# Patient Record
Sex: Female | Born: 1960 | Race: White | Hispanic: No | Marital: Single | Smoking: Never smoker
Health system: Southern US, Community
[De-identification: ages and names within clinical notes are randomized; demographics above are authoritative.]

## PROBLEM LIST (undated history)

## (undated) DIAGNOSIS — I639 Cerebral infarction, unspecified: Secondary | ICD-10-CM

## (undated) DIAGNOSIS — I10 Essential (primary) hypertension: Secondary | ICD-10-CM

---

## 2012-05-22 DIAGNOSIS — I639 Cerebral infarction, unspecified: Secondary | ICD-10-CM

## 2012-05-22 HISTORY — DX: Cerebral infarction, unspecified: I63.9

## 2016-06-25 ENCOUNTER — Emergency Department (HOSPITAL_COMMUNITY)
Admission: EM | Admit: 2016-06-25 | Discharge: 2016-06-25 | Disposition: A | Discharge status: Home / Self Care | Payer: Medicaid Other | Attending: Emergency Medicine | Admitting: Emergency Medicine

## 2016-06-25 ENCOUNTER — Encounter (HOSPITAL_COMMUNITY): Payer: Self-pay

## 2016-06-25 DIAGNOSIS — R634 Abnormal weight loss: Secondary | ICD-10-CM | POA: Diagnosis present

## 2016-06-25 DIAGNOSIS — Z79899 Other long term (current) drug therapy: Secondary | ICD-10-CM | POA: Insufficient documentation

## 2016-06-25 DIAGNOSIS — B351 Tinea unguium: Secondary | ICD-10-CM

## 2016-06-25 DIAGNOSIS — B353 Tinea pedis: Secondary | ICD-10-CM

## 2016-06-25 HISTORY — DX: Essential (primary) hypertension: I10

## 2016-06-25 HISTORY — DX: Cerebral infarction, unspecified: I63.9

## 2016-06-25 LAB — BASIC METABOLIC PANEL
Anion gap: 12 (ref 5–15)
BUN: 11 mg/dL (ref 6–20)
CALCIUM: 9.9 mg/dL (ref 8.9–10.3)
CO2: 24 mmol/L (ref 22–32)
CREATININE: 0.98 mg/dL (ref 0.44–1.00)
Chloride: 105 mmol/L (ref 101–111)
GFR calc Af Amer: >60 mL/min (ref >60)
GLUCOSE: 125 mg/dL — AB (ref 65–99)
POTASSIUM: 3.6 mmol/L (ref 3.5–5.1)
Sodium: 141 mmol/L (ref 135–145)

## 2016-06-25 LAB — CBC
HEMATOCRIT: 39.4 % (ref 36.0–46.0)
Hemoglobin: 12.2 g/dL (ref 12.0–15.0)
MCH: 21.9 pg — ABNORMAL LOW (ref 26.0–34.0)
MCHC: 31 g/dL (ref 30.0–36.0)
MCV: 70.7 fL — ABNORMAL LOW (ref 78.0–100.0)
Platelets: 157 10*3/uL (ref 150–400)
RBC: 5.57 MIL/uL — ABNORMAL HIGH (ref 3.87–5.11)
RDW: 17.5 % — AB (ref 11.5–15.5)
WBC: 5.5 10*3/uL (ref 4.0–10.5)

## 2016-06-25 MED ORDER — FLUCONAZOLE 100 MG PO TABS
150.0000 mg | ORAL_TABLET | Freq: Once | ORAL | Status: AC
  Administered 2016-06-25: 150 mg via ORAL
  Filled 2016-06-25: qty 2

## 2016-06-25 MED ORDER — FLUCONAZOLE 150 MG PO TABS: 150.0000 mg | ORAL_TABLET | ORAL | 0 refills | Status: AC

## 2016-06-25 MED ORDER — KETOCONAZOLE 2 % EX CREA: 1.0000 "application " | TOPICAL_CREAM | Freq: Two times a day (BID) | CUTANEOUS | 0 refills | Status: AC

## 2016-06-25 NOTE — ED Triage Notes (Signed)
Patient here with daughter who is concerned that patients feet appear discolored and has noticed some blisters to feet. On exam denies pain, wrinkled loose skin with ruptured possible blisters to toes, daughter states have been there for some time also increased weight loss. NAD

## 2016-06-25 NOTE — ED Provider Notes (Signed)
[facility] DEPT Provider Note   CSN: [id number] Arrival date & time: 06/25/16  1837     History   Chief Complaint Chief Complaint  Patient presents with  . blisters feet/ weight loss    HPI Erica Lopez is a 56 y.o. female.  HPI   56 yo F with h/o HTN, CVA who p/w "blisters" on her feet. Pt presents with her daughter. Daughter reports that pt was recently brought to live with her as she was not being cared for at her home in [place]. Pt arrived with "black blisters" between her toes b/l. Her daughter washed her thoroughly but some of the blisters have persisted. Pt has h/o similar blisters from fleas in the past. No rash or redness. No swelling. Pt is currently without complaints denies any leg pain, swelling, redness. No new detergents, clothes, or socks. No known insect exposures. No history of autoimmune conditions.  Past Medical History:  Diagnosis Date  . Hypertension   . Stroke [facility])     There are no active problems to display for this patient.   History reviewed. No pertinent surgical history.  OB History    No data available       Home Medications    Prior to Admission medications   Medication Sig Start Date End Date Taking? Authorizing Provider  fluconazole (DIFLUCAN) 150 MG tablet Take 1 tablet (150 mg total) by mouth once a week. 06/25/16 07/23/16  Shaune Pollack, MD  ketoconazole (NIZORAL) 2 % cream Apply 1 application topically 2 (two) times daily. 06/25/16 07/09/16  Shaune Pollack, MD    Family History No family history on file.  Social History Social History  Substance Use Topics  . Smoking status: Never Smoker  . Smokeless tobacco: Never Used  . Alcohol use Not on file     Allergies   Aspirin   Review of Systems Review of Systems  Constitutional: Negative for chills, fatigue and fever.  HENT: Negative for congestion, rhinorrhea and sore throat.   Eyes: Negative for visual disturbance.  Respiratory: Negative for cough, shortness of breath and  wheezing.   Cardiovascular: Negative for chest pain and leg swelling.  Gastrointestinal: Negative for abdominal pain, diarrhea, nausea and vomiting.  Genitourinary: Negative for dysuria, flank pain, vaginal bleeding and vaginal discharge.  Musculoskeletal: Negative for neck pain.  Skin: Positive for rash.  Allergic/Immunologic: Negative for immunocompromised state.  Neurological: Negative for syncope and headaches.  Hematological: Does not bruise/bleed easily.  All other systems reviewed and are negative.    Physical Exam Updated Vital Signs BP 141/93   Pulse 80   Temp 98.7 F (37.1 C) (Oral)   Resp 18   SpO2 100%   Physical Exam  Constitutional: She is oriented to person, place, and time. She appears well-developed and well-nourished. No distress.  HENT:  Head: Normocephalic and atraumatic.  Eyes: Conjunctivae are normal.  Neck: Neck supple.  Cardiovascular: Normal rate, regular rhythm and normal heart sounds.  Exam reveals no friction rub.   No murmur heard. Pulmonary/Chest: Effort normal and breath sounds normal. No respiratory distress. She has no wheezes. She has no rales.  Abdominal: She exhibits no distension.  Musculoskeletal: She exhibits no edema.  Neurological: She is alert and oriented to person, place, and time. She exhibits normal muscle tone.  Skin: Skin is warm. Capillary refill takes less than 2 seconds.  Superficial excoriation in interdigital spaces bilaterally with secondary skin thickening and flaking. Marked thickening and yellowing of toenails b/l. No erythema, warmth, fluctuance,  or drainage, No TTP.  Psychiatric: She has a normal mood and affect.  Nursing note and vitals reviewed.    ED Treatments / Results  Labs (all labs ordered are listed, but only abnormal results are displayed) Labs Reviewed  BASIC METABOLIC PANEL - Abnormal; Notable for the following:       Result Value   Glucose, Bld 125 (*)    All other components within normal limits    CBC - Abnormal; Notable for the following:    RBC 5.57 (*)    MCV 70.7 (*)    MCH 21.9 (*)    RDW 17.5 (*)    All other components within normal limits    EKG  EKG Interpretation None       Radiology No results found.  Procedures Procedures (including critical care time)  Medications Ordered in ED Medications  fluconazole (DIFLUCAN) tablet 150 mg (not administered)     Initial Impression / Assessment and Plan / ED Course  I have reviewed the triage vital signs and the nursing notes.  Pertinent labs & imaging results that were available during my care of the patient were reviewed by me and considered in my medical decision making (see chart for details).    56 yo F with h/o HTN, CVA who p/w mild scaling/skin darkening of bilateral feet, c/w tinea pedis and fungal infection. No erythema, warmth, TTP or signs of cellulitis or abscess. No leg swelling, do not suspect DVT. Pt is o/w systemically very well appearing, in NAD, and without other complaints. Mental status is at baseline. CBC, BMP reassuring. Will place on topical as well as oral antifungals for severe onychomycosis, and refer to Podiatry as well as PCP.  Final Clinical Impressions(s) / ED Diagnoses   Final diagnoses:  Tinea pedis of both feet  Onychomycosis    New Prescriptions New Prescriptions   FLUCONAZOLE (DIFLUCAN) 150 MG TABLET    Take 1 tablet (150 mg total) by mouth once a week.   KETOCONAZOLE (NIZORAL) 2 % CREAM    Apply 1 application topically 2 (two) times daily.     Shaune Pollackameron Harlyn Rathmann, MD 06/25/16 2053

## 2016-06-25 NOTE — ED Notes (Signed)
EDP at bedside  

## 2016-07-13 ENCOUNTER — Ambulatory Visit: Payer: Medicaid Other | Attending: Family Medicine | Admitting: Family Medicine

## 2016-07-13 ENCOUNTER — Encounter: Payer: Self-pay | Admitting: Family Medicine

## 2016-07-13 DIAGNOSIS — R636 Underweight: Secondary | ICD-10-CM | POA: Insufficient documentation

## 2016-07-13 DIAGNOSIS — B353 Tinea pedis: Secondary | ICD-10-CM | POA: Diagnosis not present

## 2016-07-13 DIAGNOSIS — R630 Anorexia: Secondary | ICD-10-CM | POA: Diagnosis present

## 2016-07-13 DIAGNOSIS — Z8673 Personal history of transient ischemic attack (TIA), and cerebral infarction without residual deficits: Secondary | ICD-10-CM | POA: Diagnosis not present

## 2016-07-13 DIAGNOSIS — I10 Essential (primary) hypertension: Secondary | ICD-10-CM | POA: Insufficient documentation

## 2016-07-13 DIAGNOSIS — I69319 Unspecified symptoms and signs involving cognitive functions following cerebral infarction: Secondary | ICD-10-CM | POA: Diagnosis not present

## 2016-07-13 MED ORDER — AMLODIPINE BESYLATE 10 MG PO TABS: 10.0000 mg | ORAL_TABLET | Freq: Every day | ORAL | 3 refills | Status: DC

## 2016-07-13 MED ORDER — LISINOPRIL 40 MG PO TABS: 40.0000 mg | ORAL_TABLET | Freq: Every day | ORAL | 3 refills | Status: DC

## 2016-07-13 NOTE — Progress Notes (Signed)
Subjective:  Patient ID: Erica BurrowMiriam Chabot, female    DOB: [birth year]  Age: 56 y.o. MRN: [id number]  CC: Establish Care; foot issues; Anorexia; and stroke (2014)   HPI Erica Lopez presents To establish care. Medical history is significant for CVA in 2014 with resulting mild cognitive deficits and no hemiparesis, hypertension. She was recently seen at the ED for tinea pedis and was placed on Diflucan as well as Nizoral cream which she has been compliant with.  She is accompanied by her daughter who is concerned about the poor care she received while living with other family [place]; she has been anorexic and has lost weight (unable to quantify weight loss). She has [place] her [place] and plans to keep her here. We have no additional concerns at this time  Past Medical History:  Diagnosis Date  . Hypertension   . Stroke [facility](HCC)     History reviewed. No pertinent surgical history.  Allergies  Allergen Reactions  . Aspirin Anaphylaxis     Outpatient Medications Prior to Visit  Medication Sig Dispense Refill  . fluconazole (DIFLUCAN) 150 MG tablet Take 1 tablet (150 mg total) by mouth once a week. 4 tablet 0   No facility-administered medications prior to visit.     ROS Review of Systems  Constitutional: Positive for appetite change. Negative for activity change and fatigue.  HENT: Negative for congestion, sinus pressure and sore throat.   Eyes: Negative for visual disturbance.  Respiratory: Negative for cough, chest tightness, shortness of breath and wheezing.   Cardiovascular: Negative for chest pain and palpitations.  Gastrointestinal: Negative for abdominal distention, abdominal pain and constipation.  Endocrine: Negative for polydipsia.  Genitourinary: Negative for dysuria and frequency.  Musculoskeletal: Negative for arthralgias and back pain.  Skin:       See hpi  Neurological: Negative for tremors, light-headedness and numbness.    Hematological: Does not bruise/bleed easily.  Psychiatric/Behavioral: Negative for agitation and behavioral problems.    Objective:  BP 120/83 (BP Location: Right Arm, Patient Position: Sitting, Cuff Size: Small)   Pulse 97   Temp 98.2 F (36.8 C) (Oral)   Ht 5' 3.5" (1.613 m)   Wt 97 lb 3.2 oz (44.1 kg)   SpO2 98%   BMI 16.95 kg/m   BP/Weight 07/13/2016 06/25/2016  Systolic BP 120 141  Diastolic BP 83 93  Wt. (Lbs) 97.2 -  BMI 16.95 -      Physical Exam  Constitutional: She is oriented to person, place, and time.  Thin  Cardiovascular: Normal rate, normal heart sounds and intact distal pulses.   No murmur heard. Pulmonary/Chest: Effort normal and breath sounds normal. She has no wheezes. She has no rales. She exhibits no tenderness.  Abdominal: Soft. Bowel sounds are normal. She exhibits no distension and no mass. There is no tenderness.  Musculoskeletal: Normal range of motion.  Neurological: She is alert and oriented to person, place, and time.  Skin:  Tinea pedis in webspaces toes bilaterally     Assessment & Plan:   1. History of stroke Mild cognitive decline  2. Tinea pedis of both feet Tinea Diflucan and Nizoral cream.  3. Underweight Could be secondary to anorexia but will need to exclude thyroid disorder ? Lack of committed care giver - TSH  4. Essential hypertension Controlled Low-sodium diet - lisinopril (PRINIVIL,ZESTRIL) 40 MG tablet; Take 1 tablet (40 mg total) by mouth daily.  Dispense: 30 tablet; Refill: 3 - amLODipine (NORVASC) 10  MG tablet; Take 1 tablet (10 mg total) by mouth daily.  Dispense: 30 tablet; Refill: 3   Meds ordered this encounter  Medications  . lisinopril (PRINIVIL,ZESTRIL) 40 MG tablet    Sig: Take 1 tablet (40 mg total) by mouth daily.    Dispense:  30 tablet    Refill:  3  . amLODipine (NORVASC) 10 MG tablet    Sig: Take 1 tablet (10 mg total) by mouth daily.    Dispense:  30 tablet    Refill:  3    Follow-up:  Return in about 1 month (around 08/10/2016) for Follow-up on tinea pedis and weight.   Jaclyn Shaggy MD

## 2016-08-03 ENCOUNTER — Emergency Department (HOSPITAL_COMMUNITY)
Admission: EM | Admit: 2016-08-03 | Discharge: 2016-08-03 | Disposition: A | Discharge status: Home / Self Care | Payer: Medicaid Other | Attending: Physician Assistant | Admitting: Physician Assistant

## 2016-08-03 ENCOUNTER — Emergency Department (HOSPITAL_COMMUNITY): Payer: Medicaid Other

## 2016-08-03 ENCOUNTER — Encounter (HOSPITAL_COMMUNITY): Payer: Self-pay

## 2016-08-03 DIAGNOSIS — R55 Syncope and collapse: Secondary | ICD-10-CM

## 2016-08-03 DIAGNOSIS — E860 Dehydration: Secondary | ICD-10-CM | POA: Insufficient documentation

## 2016-08-03 DIAGNOSIS — Z79899 Other long term (current) drug therapy: Secondary | ICD-10-CM | POA: Diagnosis not present

## 2016-08-03 DIAGNOSIS — Z8673 Personal history of transient ischemic attack (TIA), and cerebral infarction without residual deficits: Secondary | ICD-10-CM | POA: Insufficient documentation

## 2016-08-03 DIAGNOSIS — I10 Essential (primary) hypertension: Secondary | ICD-10-CM | POA: Insufficient documentation

## 2016-08-03 LAB — URINALYSIS, ROUTINE W REFLEX MICROSCOPIC
BILIRUBIN URINE: NEGATIVE
Glucose, UA: 50 mg/dL — AB
Hgb urine dipstick: NEGATIVE
KETONES UR: 20 mg/dL — AB
Nitrite: NEGATIVE
Protein, ur: 100 mg/dL — AB
Specific Gravity, Urine: 1.019 (ref 1.005–1.030)
pH: 5 (ref 5.0–8.0)

## 2016-08-03 LAB — BASIC METABOLIC PANEL
ANION GAP: 11 (ref 5–15)
BUN: 18 mg/dL (ref 6–20)
CALCIUM: 9.4 mg/dL (ref 8.9–10.3)
CO2: 25 mmol/L (ref 22–32)
Chloride: 104 mmol/L (ref 101–111)
Creatinine, Ser: 1.37 mg/dL — ABNORMAL HIGH (ref 0.44–1.00)
GFR calc Af Amer: 49 mL/min — ABNORMAL LOW (ref >60)
GFR calc non Af Amer: 42 mL/min — ABNORMAL LOW (ref >60)
GLUCOSE: 123 mg/dL — AB (ref 65–99)
Potassium: 6.1 mmol/L — ABNORMAL HIGH (ref 3.5–5.1)
Sodium: 140 mmol/L (ref 135–145)

## 2016-08-03 LAB — CBC
HCT: 46.6 % — ABNORMAL HIGH (ref 36.0–46.0)
HEMOGLOBIN: 14.6 g/dL (ref 12.0–15.0)
MCH: 22 pg — AB (ref 26.0–34.0)
MCHC: 31.3 g/dL (ref 30.0–36.0)
MCV: 70.2 fL — ABNORMAL LOW (ref 78.0–100.0)
Platelets: 180 10*3/uL (ref 150–400)
RBC: 6.64 MIL/uL — ABNORMAL HIGH (ref 3.87–5.11)
RDW: 17.1 % — AB (ref 11.5–15.5)
WBC: 8.5 10*3/uL (ref 4.0–10.5)

## 2016-08-03 LAB — I-STAT TROPONIN, ED: TROPONIN I, POC: 0 ng/mL (ref 0.00–0.08)

## 2016-08-03 MED ORDER — SODIUM CHLORIDE 0.9 % IV BOLUS (SEPSIS)
1000.0000 mL | Freq: Once | INTRAVENOUS | Status: AC
  Administered 2016-08-03: 1000 mL via INTRAVENOUS

## 2016-08-03 NOTE — ED Notes (Signed)
Pt ambulatory around nursing station with this nurse and Kenney Housemananya, NT. Pt denies SOB, dizziness, or diffuculty walking. Pt with steady gait with one assist. Pt O2 sat 99-100%

## 2016-08-03 NOTE — ED Notes (Signed)
Pt refusing XR, states she wants to go home. Dr. Juliann ParesMackeun aware.

## 2016-08-03 NOTE — ED Notes (Signed)
ED Provider at bedside. 

## 2016-08-03 NOTE — ED Triage Notes (Signed)
Pt presents to the ed with ems for a syncopal episode, she was found on the floor by the daughter, she was responsive to voice unknown for how long she was down, she was sitting on the toilet on ems arrival, denies any injury or complaints at this time. Daughter states last time this happened she had a stroke. Reports slowed speech that has been increasingly worse for a year.

## 2016-08-03 NOTE — ED Notes (Signed)
Patient transported to CT 

## 2016-08-03 NOTE — ED Provider Notes (Addendum)
[facility] Provider Note   CSN: [id number] Arrival date & time: 08/03/16  1423     History   Chief Complaint Chief Complaint  Patient presents with  . Loss of Consciousness    HPI Erica Lopez is a 56 y.o. female.  HPI   Patient is a 89 her old female presenting with syncope. This happened while patient went to the bathroom. Patient remembers going to the bathroom and then she woke up on the floor. Her daughter found her on the floor. Patient has history of stroke 2 years ago. History of hypertension, no diabetes no hyperlipidemia. Patient has no history of CHF.   Patient denies shortness of breath, chest pain.  Has global weakness.   Past Medical History:  Diagnosis Date  . Hypertension   . Stroke [facility](HCC)     Patient Active Problem List   Diagnosis Date Noted  . History of stroke 07/13/2016  . Tinea pedis 07/13/2016  . Underweight 07/13/2016  . Hypertension 07/13/2016    History reviewed. No pertinent surgical history.  OB History    No data available       Home Medications    Prior to Admission medications   Medication Sig Start Date End Date Taking? Authorizing Provider  amLODipine (NORVASC) 10 MG tablet Take 1 tablet (10 mg total) by mouth daily. 07/13/16   Jaclyn ShaggyEnobong Amao, MD  ketoconazole (NIZORAL) 2 % cream Apply 1 application topically daily.    Historical Provider, MD  lisinopril (PRINIVIL,ZESTRIL) 40 MG tablet Take 1 tablet (40 mg total) by mouth daily. 07/13/16   Jaclyn ShaggyEnobong Amao, MD    Family History History reviewed. No pertinent family history.  Social History Social History  Substance Use Topics  . Smoking status: Never Smoker  . Smokeless tobacco: Never Used  . Alcohol use No     Allergies   Aspirin   Review of Systems Review of Systems  Constitutional: Positive for fatigue. Negative for activity change and fever.  Respiratory: Negative for shortness of breath.   Cardiovascular: Negative for chest pain.  Gastrointestinal:  Negative for abdominal pain.  Neurological: Positive for syncope. Negative for dizziness, seizures, weakness and light-headedness.  All other systems reviewed and are negative.    Physical Exam Updated Vital Signs BP (!) 130/98   Pulse (!) 133   Temp 97.9 F (36.6 C) (Oral)   Resp 13   SpO2 97%   Physical Exam  Constitutional: She is oriented to person, place, and time. She appears well-developed and well-nourished.  HENT:  Head: Normocephalic and atraumatic.  Dry mucus membranes  Eyes: Right eye exhibits no discharge. Left eye exhibits no discharge.  Cardiovascular: Regular rhythm.   No murmur heard. tachycardia  Pulmonary/Chest: Effort normal and breath sounds normal. No respiratory distress. She has no wheezes.  Musculoskeletal: Normal range of motion.  Neurological: She is oriented to person, place, and time.  Equal strength bilaterally upper and lower extremities negative pronator drift. Normal sensation bilaterally. Speech comprehensible, no slurring. Facial nerve tested and mild droop on left. l. Alert and oriented 3. Slowed responses.   Skin: Skin is warm and dry. She is not diaphoretic.  Psychiatric: She has a normal mood and affect.  Nursing note and vitals reviewed.    ED Treatments / Results  Labs (all labs ordered are listed, but only abnormal results are displayed) Labs Reviewed  BASIC METABOLIC PANEL - Abnormal; Notable for the following:       Result Value   Potassium 6.1 (*)  Glucose, Bld 123 (*)    Creatinine, Ser 1.37 (*)    GFR calc non Af Amer 42 (*)    GFR calc Af Amer 49 (*)    All other components within normal limits  CBC - Abnormal; Notable for the following:    RBC 6.64 (*)    HCT 46.6 (*)    MCV 70.2 (*)    MCH 22.0 (*)    RDW 17.1 (*)    All other components within normal limits  URINALYSIS, ROUTINE W REFLEX MICROSCOPIC - Abnormal; Notable for the following:    APPearance HAZY (*)    Glucose, UA 50 (*)    Ketones, ur 20 (*)     Protein, ur 100 (*)    Leukocytes, UA MODERATE (*)    Bacteria, UA FEW (*)    Squamous Epithelial / LPF 0-5 (*)    All other components within normal limits  I-STAT TROPOININ, ED  CBG MONITORING, ED    EKG  EKG Interpretation  Date/Time:  Thursday August 03 2016 14:43:52 EDT Ventricular Rate:  78 PR Interval:    QRS Duration: 85 QT Interval:  367 QTC Calculation: 418 R Axis:   81 Text Interpretation:  Sinus rhythm Low voltage with right axis deviation Probable anteroseptal infarct, old no sig ischemia noted Confirmed by Kandis Mannan ([id number]) on 08/03/2016 3:01:05 PM Also confirmed by Kandis Mannan ([id number]), editor Misty Stanley [phone])  on 08/03/2016 3:01:55 PM Also confirmed by Kandis Mannan ([id number])  on 08/03/2016 3:15:57 PM       Radiology Ct Head Wo Contrast  Result Date: 08/03/2016 CLINICAL DATA:  Syncopal episode EXAM: CT HEAD WITHOUT CONTRAST TECHNIQUE: Contiguous axial images were obtained from the base of the skull through the vertex without intravenous contrast. COMPARISON:  None. FINDINGS: Brain: Scattered areas of decreased attenuation are noted consistent with chronic white matter ischemic change. No findings to suggest acute hemorrhage, acute infarction or space-occupying mass lesion are noted. Vascular: No hyperdense vessel or unexpected calcification. Skull: Normal. Negative for fracture or focal lesion. Sinuses/Orbits: No acute finding. Other: None. IMPRESSION: Chronic white matter ischemic change without acute abnormality. Electronically Signed   By: Alcide Clever M.D.   On: 08/03/2016 16:55    Procedures Procedures (including critical care time)  Medications Ordered in ED Medications  sodium chloride 0.9 % bolus 1,000 mL (not administered)     Initial Impression / Assessment and Plan / ED Course  I have reviewed the triage vital signs and the nursing notes.  Pertinent labs & imaging results that were available during my care of the patient  were reviewed by me and considered in my medical decision making (see chart for details).    This 75 her old female with history of stroke 2 years ago from hypertension. Patient has presented today with syncope. Patient has left facial weakness (residual).. We will get labs to make sure patient does not have anemia, or other electrolyte abnormality causing syncope.  EKG  Is non specific.  Pt appears globally weak thought walked without assistance very well.  She just has some cognitive slowling.   Patient's daughter is concerned because she is unable to eat at home. (refuses to eat) We receommend her bring this up with PCP for nutritionist appt.   Discussed admission with patient and her daughter. Patient like to go home. She refuses admission at this time. Recommend patient that she should get some fluids before going home. She does have an appointment with a primary care  physician next week. She reports that she will go and get her labs checked at that visit.Marland Kitchen Discussed risks and benefits.   Final Clinical Impressions(s) / ED Diagnoses   Final diagnoses:  Syncope and collapse    New Prescriptions New Prescriptions   No medications on file     Courteney Randall An, MD 08/03/16 1708    Courteney Lyn Mackuen, MD 08/03/16 1726    Courteney Randall An, MD 08/03/16 1730    Courteney Lyn Mackuen, MD 08/03/16 1730

## 2016-08-03 NOTE — ED Notes (Signed)
Blood return noted and peripheral IV flushes appropriately however catheter is not fully advanced. Attempted a second line on pt but was unsuccessful. Pt reports she would rather administer the bolus in the 20 G she already has and keep a close eye "than be stuck again". Will continue to assess pt peripheral line for complications.

## 2016-08-03 NOTE — Discharge Instructions (Signed)
You're very dehydrated on exam. Your kidney showed that you're not drinking enough fluids. Please continue drinking fluids and have your kidney rechecked. Please return if you have any chest pain, or any other concerns.

## 2016-08-10 ENCOUNTER — Ambulatory Visit: Payer: Medicaid Other | Attending: Family Medicine | Admitting: Family Medicine

## 2016-08-10 ENCOUNTER — Encounter: Payer: Self-pay | Admitting: Family Medicine

## 2016-08-10 VITALS — BP 143/82 | HR 74 | Temp 97.8°F | Ht 63.0 in | Wt 97.4 lb

## 2016-08-10 DIAGNOSIS — Q819 Epidermolysis bullosa, unspecified: Secondary | ICD-10-CM | POA: Diagnosis not present

## 2016-08-10 DIAGNOSIS — B353 Tinea pedis: Secondary | ICD-10-CM | POA: Insufficient documentation

## 2016-08-10 DIAGNOSIS — E43 Unspecified severe protein-calorie malnutrition: Secondary | ICD-10-CM | POA: Insufficient documentation

## 2016-08-10 DIAGNOSIS — E860 Dehydration: Secondary | ICD-10-CM | POA: Diagnosis present

## 2016-08-10 DIAGNOSIS — I10 Essential (primary) hypertension: Secondary | ICD-10-CM

## 2016-08-10 DIAGNOSIS — E46 Unspecified protein-calorie malnutrition: Secondary | ICD-10-CM | POA: Insufficient documentation

## 2016-08-10 DIAGNOSIS — Z8673 Personal history of transient ischemic attack (TIA), and cerebral infarction without residual deficits: Secondary | ICD-10-CM | POA: Insufficient documentation

## 2016-08-10 MED ORDER — LISINOPRIL 40 MG PO TABS: 40.0000 mg | ORAL_TABLET | Freq: Every day | ORAL | 1 refills | Status: DC

## 2016-08-10 MED ORDER — MIRTAZAPINE 15 MG PO TABS: 15.0000 mg | ORAL_TABLET | Freq: Every day | ORAL | 1 refills | Status: DC

## 2016-08-10 MED ORDER — AMLODIPINE BESYLATE 10 MG PO TABS: 10.0000 mg | ORAL_TABLET | Freq: Every day | ORAL | 1 refills | Status: DC

## 2016-08-10 NOTE — Progress Notes (Signed)
Refill on lisinopril

## 2016-08-10 NOTE — Progress Notes (Signed)
Subjective:  Patient ID: Erica Lopez, female    DOB: 18-Apr-1961  Age: 56 y.o. MRN: [id number]  CC: Follow-up (tinea pedis); Fall (last weekend); Dehydration; and Hx of stroke   HPI [facility] Medical history is significant for CVA in 2014 with resulting mild cognitive deficits and no hemiparesis, Hypertension, Protein calorie malnutrition who comes in for a follow-up visit.  She was treated for tinea pedis recently with resulting improvement in symptoms. Her daughter is still concerned about the fact that her mom is yet to gain weight (which is stable compared to her last office visit). As per the patient she hasn't found much likes to eat. She answers in monosyllables as is typical of her.  She did have a fall last weekend and her daughter is requesting completion of form for handicap placard.  Past Medical History:  Diagnosis Date  . Hypertension   . Stroke [facility])     Allergies  Allergen Reactions  . Aspirin Anaphylaxis    Bleeding event     Outpatient Medications Prior to Visit  Medication Sig Dispense Refill  . amLODipine (NORVASC) 10 MG tablet Take 1 tablet (10 mg total) by mouth daily. 30 tablet 3  . lisinopril (PRINIVIL,ZESTRIL) 40 MG tablet Take 1 tablet (40 mg total) by mouth daily. 30 tablet 3   No facility-administered medications prior to visit.     ROS Review of Systems Constitutional: Positive for appetite change. Negative for activity change and fatigue.  HENT: Negative for congestion, sinus pressure and sore throat.   Eyes: Negative for visual disturbance.  Respiratory: Negative for cough, chest tightness, shortness of breath and wheezing.   Cardiovascular: Negative for chest pain and palpitations.  Gastrointestinal: Negative for abdominal distention, abdominal pain and constipation.  Endocrine: Negative for polydipsia.  Genitourinary: Negative for dysuria and frequency.  Musculoskeletal: Negative for arthralgias and back pain.  Skin:       See hpi    Neurological: Negative for tremors, light-headedness and numbness.  Hematological: Does not bruise/bleed easily.  Psychiatric/Behavioral: Negative for agitation and behavioral problems  Objective:  BP (!) 143/82 (BP Location: Right Arm, Patient Position: Sitting, Cuff Size: Small)   Pulse 74   Temp 97.8 F (36.6 C) (Oral)   Ht 5\' 3"  (1.6 m)   Wt 97 lb 6.4 oz (44.2 kg)   SpO2 99%   BMI 17.25 kg/m   BP/Weight 08/10/2016 08/03/2016 07/13/2016  Systolic BP 143 109 120  Diastolic BP 82 84 83  Wt. (Lbs) 97.4 - 97.2  BMI 17.25 - 16.95     Physical Exam  Constitutional: She is oriented to person, place, and time. She appears well-developed and well-nourished.  Thin  Cardiovascular: Normal rate, normal heart sounds and intact distal pulses.   No murmur heard. Pulmonary/Chest: Effort normal and breath sounds normal. She has no wheezes. She has no rales. She exhibits no tenderness.  Abdominal: Soft. Bowel sounds are normal. She exhibits no distension and no mass. There is no tenderness.  Musculoskeletal: Normal range of motion.  Neurological: She is alert and oriented to person, place, and time.  Skin:  Dystrophic toenails     Assessment & Plan:   1. Essential hypertension Slightly elevated Low-sodium diet - lisinopril (PRINIVIL,ZESTRIL) 40 MG tablet; Take 1 tablet (40 mg total) by mouth daily.  Dispense: 90 tablet; Refill: 1 - amLODipine (NORVASC) 10 MG tablet; Take 1 tablet (10 mg total) by mouth daily.  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 Severe protein-calorie malnutrition (HCC) Additional Remeron to regimen which will help with appetite and sleep  4. History of stroke Mild cognitive deficits No hemiparesis Completed form for handicap placard   Meds ordered this encounter  Medications  . lisinopril (PRINIVIL,ZESTRIL) 40 MG tablet    Sig: Take 1 tablet (40  mg total) by mouth daily.    Dispense:  90 tablet    Refill:  1  . amLODipine (NORVASC) 10 MG tablet    Sig: Take 1 tablet (10 mg total) by mouth daily.    Dispense:  90 tablet    Refill:  1  . mirtazapine (REMERON) 15 MG tablet    Sig: Take 1 tablet (15 mg total) by mouth at bedtime.    Dispense:  90 tablet    Refill:  1    Follow-up: Return in about 3 months (around 11/10/2016) for follow up on hypertension.   Jaclyn ShaggyEnobong Amao MD

## 2016-08-11 ENCOUNTER — Telehealth: Payer: Self-pay

## 2016-08-11 LAB — BASIC METABOLIC PANEL
BUN: 12 mg/dL (ref 7–25)
CALCIUM: 9.8 mg/dL (ref 8.6–10.4)
CO2: 20 mmol/L (ref 20–31)
Chloride: 103 mmol/L (ref 98–110)
Creat: 0.98 mg/dL (ref 0.50–1.05)
Glucose, Bld: 88 mg/dL (ref 65–99)
POTASSIUM: 4.9 mmol/L (ref 3.5–5.3)
SODIUM: 140 mmol/L (ref 135–146)

## 2016-08-11 NOTE — Telephone Encounter (Signed)
Pt was called and a VM was left informing pt to return phone call. 

## 2016-08-15 ENCOUNTER — Telehealth: Payer: Self-pay

## 2016-08-15 NOTE — Telephone Encounter (Signed)
Pt lab results were given to daughter.

## 2016-08-30 ENCOUNTER — Encounter: Payer: Self-pay | Admitting: Podiatry

## 2016-08-30 ENCOUNTER — Ambulatory Visit (INDEPENDENT_AMBULATORY_CARE_PROVIDER_SITE_OTHER): Payer: Medicaid Other | Admitting: Podiatry

## 2016-08-30 VITALS — BP 162/107 | HR 67

## 2016-08-30 DIAGNOSIS — S91209A Unspecified open wound of unspecified toe(s) with damage to nail, initial encounter: Secondary | ICD-10-CM | POA: Diagnosis not present

## 2016-08-30 NOTE — Progress Notes (Signed)
   Subjective:    Patient ID: Erica Lopez, female    DOB: 1960/07/06, 56 y.o.   MRN: [id number]  HPI this patient presents the office with chief complaint of painful long thick nails on both big toes. The nails have grown thick and long 4 over the last 2 years and no treatment has been afforded. She presents the office today for an evaluation and treatment of these nails.    Review of Systems  All other systems reviewed and are negative.      Objective:   Physical Exam GENERAL APPEARANCE: Alert, conversant. Appropriately groomed. No acute distress.  VASCULAR: Pedal pulses are  palpable at  [facility] and PT bilateral.  Capillary refill time is immediate to all digits,  Normal temperature gradient.   NEUROLOGIC: sensation is normal to 5.07 monofilament at 5/5 sites bilateral.  Light touch is intact bilateral, Muscle strength normal.  MUSCULOSKELETAL: acceptable muscle strength, tone and stability bilateral.  Intrinsic muscluature intact bilateral.  Rectus appearance of foot and digits noted bilateral.   DERMATOLOGIC: skin color, texture, and turgor are within normal limits.  No preulcerative lesions or ulcers  are seen, no interdigital maceration noted.  No open lesions present.   No drainage noted.  NAILS  Thick disfigured unattached nail plates both great toes.         Assessment & Plan:  Traumatic nails Hallux  B/L  IE  Debride hallux toenails.  RTC 4 months. Her hallux toenails are unattached from the nailbed except at the proximal nail fold. No evidence of any infection is noted   Helane Gunther DPM

## 2016-09-14 ENCOUNTER — Encounter: Payer: Self-pay | Admitting: Physician Assistant

## 2016-09-14 ENCOUNTER — Ambulatory Visit: Payer: Medicaid Other | Attending: Internal Medicine | Admitting: Physician Assistant

## 2016-09-14 VITALS — BP 158/110 | HR 95 | Temp 98.0°F | Resp 16 | Wt 92.6 lb

## 2016-09-14 DIAGNOSIS — R300 Dysuria: Secondary | ICD-10-CM | POA: Diagnosis present

## 2016-09-14 DIAGNOSIS — Z8673 Personal history of transient ischemic attack (TIA), and cerebral infarction without residual deficits: Secondary | ICD-10-CM | POA: Insufficient documentation

## 2016-09-14 DIAGNOSIS — R350 Frequency of micturition: Secondary | ICD-10-CM | POA: Diagnosis present

## 2016-09-14 DIAGNOSIS — I10 Essential (primary) hypertension: Secondary | ICD-10-CM | POA: Diagnosis not present

## 2016-09-14 DIAGNOSIS — B379 Candidiasis, unspecified: Secondary | ICD-10-CM | POA: Diagnosis not present

## 2016-09-14 DIAGNOSIS — R32 Unspecified urinary incontinence: Secondary | ICD-10-CM | POA: Diagnosis present

## 2016-09-14 DIAGNOSIS — N3001 Acute cystitis with hematuria: Secondary | ICD-10-CM | POA: Insufficient documentation

## 2016-09-14 LAB — POCT URINALYSIS DIPSTICK
Glucose, UA: NEGATIVE
Ketones, UA: 15
Nitrite, UA: NEGATIVE
Protein, UA: >300
Spec Grav, UA: 1.025
Urobilinogen, UA: 0.2 U/dL
pH, UA: 5.5

## 2016-09-14 MED ORDER — NITROFURANTOIN MONOHYD MACRO 100 MG PO CAPS: 100.0000 mg | ORAL_CAPSULE | Freq: Two times a day (BID) | ORAL | 0 refills | Status: DC

## 2016-09-14 MED ORDER — FLUCONAZOLE 150 MG PO TABS: 150.0000 mg | ORAL_TABLET | Freq: Once | ORAL | 0 refills | Status: AC

## 2016-09-14 NOTE — Progress Notes (Signed)
Erica Lopez, is a 56 y.o. female  UEA:[id number]  YNW:[id number]  DOB - [birth year]  Subjective:  Chief Complaint and HPI: Erica Lopez is a 56 y.o. female here today for a several day history of urinary frequency, mild incontinence, and dysuria.  She denies pain over her kidneys.  No N/V.  Her daughter is here with her.  She hasn't taken her BP meds today.  +/- compliance with meds.  She has a BP cuff at home but hasn't been checking BP.  H/O stroke.  No new neurological issues.  No HA/CP/Dizziness  ROS:   Constitutional:  No f/c, No night sweats, No unexplained weight loss. EENT:  No vision changes, No blurry vision, No hearing changes. No mouth, throat, or ear problems.  Respiratory: No cough, No SOB Cardiac: No CP, no palpitations GI:  No abd pain, No N/V/D. GU: + Urinary s/sx Musculoskeletal: No joint pain Neuro: No headache, no dizziness, no new motor weakness.  Skin: No rash Endocrine:  No polydipsia. No polyuria.  Psych: Denies SI/HI  No problems updated.  ALLERGIES: Allergies  Allergen Reactions  . Aspirin Anaphylaxis    Bleeding event  . Other Hives and Swelling    Blueberries, causing hives and swelling    PAST MEDICAL HISTORY: Past Medical History:  Diagnosis Date  . Hypertension   . Stroke [facility]) 2014    MEDICATIONS AT HOME: Prior to Admission medications   Medication Sig Start Date End Date Taking? Authorizing Provider  amLODipine (NORVASC) 10 MG tablet Take 1 tablet (10 mg total) by mouth daily. 08/10/16   Jaclyn Shaggy, MD  fluconazole (DIFLUCAN) 150 MG tablet Take 1 tablet (150 mg total) by mouth once. If needed for yeast infection after antibiotics 09/14/16 09/14/16  Anders Simmonds, PA-C  lisinopril (PRINIVIL,ZESTRIL) 40 MG tablet Take 1 tablet (40 mg total) by mouth daily. 08/10/16   Jaclyn Shaggy, MD  mirtazapine (REMERON) 15 MG tablet Take 1 tablet (15 mg total) by mouth at bedtime. 08/10/16   Jaclyn Shaggy, MD  nitrofurantoin,  macrocrystal-monohydrate, (MACROBID) 100 MG capsule Take 1 capsule (100 mg total) by mouth 2 (two) times daily. 09/14/16   Anders Simmonds, PA-C     Objective:  EXAM:   Vitals:   09/14/16 0947  BP: (!) 161/115  Pulse: 95  Resp: 16  Temp: 98 F (36.7 C)  TempSrc: Oral  SpO2: 96%  Weight: 92 lb 9.6 oz (42 kg)    General appearance : A&OX3. NAD. Non-toxic-appearing, delayed and slowed speech from previous stroke HEENT: Atraumatic and Normocephalic.  PERRLA. EOM intact.  TM clear B. Mouth-MM appear slightly dry Neck: supple, no JVD. No cervical lymphadenopathy. No thyromegaly Chest/Lungs:  Breathing-non-labored, Good air entry bilaterally, breath sounds normal without rales, rhonchi, or wheezing  CVS: S1 S2 regular, no murmurs, gallops, rubs  Abdomen: Bowel sounds present, Non tender and not distended with no gaurding, rigidity or rebound.  No CVAT Extremities: Bilateral Lower Ext shows no edema, both legs are warm to touch with = pulse throughout Neurology:  CN II-XII grossly intact, Non focal.   Skin:  No Rash  Data Review No results found for: HGBA1C   Assessment & Plan   1. Acute cystitis with hematuria.  No signs pyelo. - POCT urinalysis dipstick - Urine culture - nitrofurantoin, macrocrystal-monohydrate, (MACROBID) 100 MG capsule; Take 1 capsule (100 mg total) by mouth 2 (two) times daily.  Dispense: 10 capsule; Refill: 0 - fluconazole (DIFLUCAN) 150 MG tablet; Take 1 tablet (150 mg  total) by mouth once. If needed for yeast infection after antibiotics  Dispense: 1 tablet; Refill: 0 Drink 80-100 ounces of water daily  2. Essential hypertension- Uncontrolled.  Came down to 158/110 in office-Check blood pressure once daily and record and bring to next visit.  Make sure and take meds daily as directed.  Continue amlodipine and lisinopril as directed.  Patient have been counseled extensively about nutrition and exercise  Return in about 3 weeks (around 10/05/2016) for Dr  Venetia Night for uncontrolled htn/check up.  The patient was given clear instructions to go to ER or return to medical center if symptoms don't improve, worsen or new problems develop. The patient verbalized understanding. The patient was told to call to get lab results if they haven't heard anything in the next week.     Georgian Co, PA-C [facility] [place], [place] [phone]   09/14/2016, 9:56 AMPatient ID: Erica Lopez, female   DOB: 1960/11/17, 56 y.o.   MRN: [id number]

## 2016-09-14 NOTE — Patient Instructions (Signed)
Check blood pressure once daily and record and bring to next visit.  Drink 80-100 ounces of water daily  Hypertension Hypertension, commonly called high blood pressure, is when the force of blood pumping through the arteries is too strong. The arteries are the blood vessels that carry blood from the heart throughout the body. Hypertension forces the heart to work harder to pump blood and may cause arteries to become narrow or stiff. Having untreated or uncontrolled hypertension can cause heart attacks, strokes, kidney disease, and other problems. A blood pressure reading consists of a higher number over a lower number. Ideally, your blood pressure should be below 120/80. The first ("top") number is called the systolic pressure. It is a measure of the pressure in your arteries as your heart beats. The second ("bottom") number is called the diastolic pressure. It is a measure of the pressure in your arteries as the heart relaxes. What are the causes? The cause of this condition is not known. What increases the risk? Some risk factors for high blood pressure are under your control. Others are not. Factors you can change   Smoking.  Having type 2 diabetes mellitus, high cholesterol, or both.  Not getting enough exercise or physical activity.  Being overweight.  Having too much fat, sugar, calories, or salt (sodium) in your diet.  Drinking too much alcohol. Factors that are difficult or impossible to change   Having chronic kidney disease.  Having a family history of high blood pressure.  Age. Risk increases with age.  Race. You may be at higher risk if you are African-American.  Gender. Men are at higher risk than women before age 11. After age 36, women are at higher risk than men.  Having obstructive sleep apnea.  Stress. What are the signs or symptoms? Extremely high blood pressure (hypertensive crisis) may cause:  Headache.  Anxiety.  Shortness of  breath.  Nosebleed.  Nausea and vomiting.  Severe chest pain.  Jerky movements you cannot control (seizures). How is this diagnosed? This condition is diagnosed by measuring your blood pressure while you are seated, with your arm resting on a surface. The cuff of the blood pressure monitor will be placed directly against the skin of your upper arm at the level of your heart. It should be measured at least twice using the same arm. Certain conditions can cause a difference in blood pressure between your right and left arms. Certain factors can cause blood pressure readings to be lower or higher than normal (elevated) for a short period of time:  When your blood pressure is higher when you are in a health care provider's office than when you are at home, this is called white coat hypertension. Most people with this condition do not need medicines.  When your blood pressure is higher at home than when you are in a health care provider's office, this is called masked hypertension. Most people with this condition may need medicines to control blood pressure. If you have a high blood pressure reading during one visit or you have normal blood pressure with other risk factors:  You may be asked to return on a different day to have your blood pressure checked again.  You may be asked to monitor your blood pressure at home for 1 week or longer. If you are diagnosed with hypertension, you may have other blood or imaging tests to help your health care provider understand your overall risk for other conditions. How is this treated? This condition is  treated by making healthy lifestyle changes, such as eating healthy foods, exercising more, and reducing your alcohol intake. Your health care provider may prescribe medicine if lifestyle changes are not enough to get your blood pressure under control, and if:  Your systolic blood pressure is above 130.  Your diastolic blood pressure is above 80. Your  personal target blood pressure may vary depending on your medical conditions, your age, and other factors. Follow these instructions at home: Eating and drinking   Eat a diet that is high in fiber and potassium, and low in sodium, added sugar, and fat. An example eating plan is called the DASH (Dietary Approaches to Stop Hypertension) diet. To eat this way:  Eat plenty of fresh fruits and vegetables. Try to fill half of your plate at each meal with fruits and vegetables.  Eat whole grains, such as whole wheat pasta, brown rice, or whole grain bread. Fill about one quarter of your plate with whole grains.  Eat or drink low-fat dairy products, such as skim milk or low-fat yogurt.  Avoid fatty cuts of meat, processed or cured meats, and poultry with skin. Fill about one quarter of your plate with lean proteins, such as fish, chicken without skin, beans, eggs, and tofu.  Avoid premade and processed foods. These tend to be higher in sodium, added sugar, and fat.  Reduce your daily sodium intake. Most people with hypertension should eat less than 1,500 mg of sodium a day.  Limit alcohol intake to no more than 1 drink a day for nonpregnant women and 2 drinks a day for men. One drink equals 12 oz of beer, 5 oz of wine, or 1 oz of hard liquor. Lifestyle   Work with your health care provider to maintain a healthy body weight or to lose weight. Ask what an ideal weight is for you.  Get at least 30 minutes of exercise that causes your heart to beat faster (aerobic exercise) most days of the week. Activities may include walking, swimming, or biking.  Include exercise to strengthen your muscles (resistance exercise), such as pilates or lifting weights, as part of your weekly exercise routine. Try to do these types of exercises for 30 minutes at least 3 days a week.  Do not use any products that contain nicotine or tobacco, such as cigarettes and e-cigarettes. If you need help quitting, ask your health  care provider.  Monitor your blood pressure at home as told by your health care provider.  Keep all follow-up visits as told by your health care provider. This is important. Medicines   Take over-the-counter and prescription medicines only as told by your health care provider. Follow directions carefully. Blood pressure medicines must be taken as prescribed.  Do not skip doses of blood pressure medicine. Doing this puts you at risk for problems and can make the medicine less effective.  Ask your health care provider about side effects or reactions to medicines that you should watch for. Contact a health care provider if:  You think you are having a reaction to a medicine you are taking.  You have headaches that keep coming back (recurring).  You feel dizzy.  You have swelling in your ankles.  You have trouble with your vision. Get help right away if:  You develop a severe headache or confusion.  You have unusual weakness or numbness.  You feel faint.  You have severe pain in your chest or abdomen.  You vomit repeatedly.  You have trouble breathing.  Summary  Hypertension is when the force of blood pumping through your arteries is too strong. If this condition is not controlled, it may put you at risk for serious complications.  Your personal target blood pressure may vary depending on your medical conditions, your age, and other factors. For most people, a normal blood pressure is less than 120/80.  Hypertension is treated with lifestyle changes, medicines, or a combination of both. Lifestyle changes include weight loss, eating a healthy, low-sodium diet, exercising more, and limiting alcohol. This information is not intended to replace advice given to you by your health care provider. Make sure you discuss any questions you have with your health care provider. Document Released: 05/08/2005 Document Revised: 04/05/2016 Document Reviewed: 04/05/2016 Elsevier Interactive  Patient Education  2017 [employer].

## 2016-09-16 LAB — URINE CULTURE

## 2016-09-19 ENCOUNTER — Emergency Department (HOSPITAL_COMMUNITY)
Admission: EM | Admit: 2016-09-19 | Discharge: 2016-09-19 | Disposition: A | Discharge status: Home / Self Care | Payer: Medicaid Other | Attending: Emergency Medicine | Admitting: Emergency Medicine

## 2016-09-19 ENCOUNTER — Encounter (HOSPITAL_COMMUNITY): Payer: Self-pay | Admitting: Emergency Medicine

## 2016-09-19 DIAGNOSIS — I951 Orthostatic hypotension: Secondary | ICD-10-CM | POA: Diagnosis not present

## 2016-09-19 DIAGNOSIS — Z8673 Personal history of transient ischemic attack (TIA), and cerebral infarction without residual deficits: Secondary | ICD-10-CM | POA: Insufficient documentation

## 2016-09-19 DIAGNOSIS — I10 Essential (primary) hypertension: Secondary | ICD-10-CM | POA: Diagnosis not present

## 2016-09-19 DIAGNOSIS — Z79899 Other long term (current) drug therapy: Secondary | ICD-10-CM | POA: Insufficient documentation

## 2016-09-19 DIAGNOSIS — N3001 Acute cystitis with hematuria: Secondary | ICD-10-CM

## 2016-09-19 DIAGNOSIS — R4182 Altered mental status, unspecified: Secondary | ICD-10-CM | POA: Diagnosis present

## 2016-09-19 LAB — COMPREHENSIVE METABOLIC PANEL
ALBUMIN: 3.7 g/dL (ref 3.5–5.0)
ALK PHOS: 52 U/L (ref 38–126)
ALT: 18 U/L (ref 14–54)
ANION GAP: 8 (ref 5–15)
AST: 18 U/L (ref 15–41)
BILIRUBIN TOTAL: 0.2 mg/dL — AB (ref 0.3–1.2)
BUN: 25 mg/dL — AB (ref 6–20)
CO2: 28 mmol/L (ref 22–32)
Calcium: 9.8 mg/dL (ref 8.9–10.3)
Chloride: 105 mmol/L (ref 101–111)
Creatinine, Ser: 1.41 mg/dL — ABNORMAL HIGH (ref 0.44–1.00)
GFR calc Af Amer: 47 mL/min — ABNORMAL LOW (ref >60)
GFR calc non Af Amer: 40 mL/min — ABNORMAL LOW (ref >60)
GLUCOSE: 105 mg/dL — AB (ref 65–99)
POTASSIUM: 3.9 mmol/L (ref 3.5–5.1)
SODIUM: 141 mmol/L (ref 135–145)
Total Protein: 7.3 g/dL (ref 6.5–8.1)

## 2016-09-19 LAB — CBC
HEMATOCRIT: 37.2 % (ref 36.0–46.0)
HEMOGLOBIN: 11.4 g/dL — AB (ref 12.0–15.0)
MCH: 21.4 pg — ABNORMAL LOW (ref 26.0–34.0)
MCHC: 30.6 g/dL (ref 30.0–36.0)
MCV: 69.9 fL — ABNORMAL LOW (ref 78.0–100.0)
Platelets: 241 10*3/uL (ref 150–400)
RBC: 5.32 MIL/uL — ABNORMAL HIGH (ref 3.87–5.11)
RDW: 17.2 % — AB (ref 11.5–15.5)
WBC: 4.5 10*3/uL (ref 4.0–10.5)

## 2016-09-19 LAB — CBG MONITORING, ED: GLUCOSE-CAPILLARY: 136 mg/dL — AB (ref 65–99)

## 2016-09-19 MED ORDER — SODIUM CHLORIDE 0.9 % IV BOLUS (SEPSIS)
1000.0000 mL | Freq: Once | INTRAVENOUS | Status: AC
  Administered 2016-09-19: 1000 mL via INTRAVENOUS

## 2016-09-19 NOTE — ED Provider Notes (Signed)
[facility]-EMERGENCY DEPT Provider Note   CSN: [id number] Arrival date & time: 09/19/16  1637     History   Chief Complaint Chief Complaint  Patient presents with  . Altered Mental Status    HPI Erica Lopez is a 56 y.o. female. with history of stroke (2014, residual cognitive deficits) and HTN who presents with transient loss of consciousness.  She went out shopping with her daughter, and shortly after standing up out of the car felt weak and starting slumping down leaning against the side of the car.  Her daughter held stopped her from falling, and she had some brief shaking before returning to baseline.  Her daughter said she became unresponsive for several seconds, up to a minute each time.  She is now at her baseline according to patient and daughter.  Denies chest pain, palpitations, focal weakness, headache, or other symptoms.  She lives with her daughter, who says she has not been eating or drinking well for the past few weeks.  Seen by PCP last week for urinary incontinence and dysuria.  UA with pyuria and hematuria, urine grew E Coli.  Prescribed nitrofurantoin, picked it up today but has not started taking it.  HPI  Past Medical History:  Diagnosis Date  . Hypertension   . Stroke [facility]) 2014    Patient Active Problem List   Diagnosis Date Noted  . Protein calorie malnutrition (HCC) 08/10/2016  . History of stroke 07/13/2016  . Tinea pedis 07/13/2016  . Underweight 07/13/2016  . Hypertension 07/13/2016    History reviewed. No pertinent surgical history.  OB History    No data available       Home Medications    Prior to Admission medications   Medication Sig Start Date End Date Taking? Authorizing Provider  lisinopril (PRINIVIL,ZESTRIL) 40 MG tablet Take 1 tablet (40 mg total) by mouth daily. 08/10/16  Yes Jaclyn Shaggy, MD  mirtazapine (REMERON) 15 MG tablet Take 1 tablet (15 mg total) by mouth at bedtime. 08/10/16  Yes Jaclyn Shaggy, MD  nitrofurantoin,  macrocrystal-monohydrate, (MACROBID) 100 MG capsule Take 1 capsule (100 mg total) by mouth 2 (two) times daily. Patient not taking: Reported on 09/19/2016 09/14/16   Anders Simmonds, PA-C    Family History History reviewed. No pertinent family history.  Social History Social History  Substance Use Topics  . Smoking status: Never Smoker  . Smokeless tobacco: Never Used  . Alcohol use No     Allergies   Aspirin and Other   Review of Systems Review of Systems  Constitutional: Negative for chills and fever.  Respiratory: Negative for shortness of breath and wheezing.   Cardiovascular: Negative for chest pain and palpitations.  Gastrointestinal: Negative for blood in stool, diarrhea, nausea and vomiting.  Genitourinary: Positive for frequency and urgency. Negative for dysuria.  Musculoskeletal: Negative for neck pain and neck stiffness.  Neurological: Negative for dizziness, numbness and headaches.     Physical Exam Updated Vital Signs BP 126/80 (BP Location: Right Arm)   Pulse 86   Temp 98.3 F (36.8 C) (Oral)   Resp 14   SpO2 100%   Physical Exam  Constitutional: She is oriented to person, place, and time. She appears well-developed and well-nourished. No distress.  HENT:  Head: Normocephalic and atraumatic.  Eyes: Conjunctivae and EOM are normal. Pupils are equal, round, and reactive to light.  Neck: Normal range of motion. Neck supple.  Cardiovascular: Normal rate, regular rhythm and normal heart sounds.   Pulmonary/Chest: Effort normal  and breath sounds normal.  Abdominal: Soft. She exhibits no distension. There is no tenderness.  Musculoskeletal: She exhibits no edema or tenderness.  Neurological: She is alert and oriented to person, place, and time. No cranial nerve deficit or sensory deficit. She exhibits normal muscle tone. Coordination normal.  Skin: Skin is warm and dry. No pallor.  Psychiatric: She has a normal mood and affect. Her behavior is normal.    Speech slow     ED Treatments / Results  Labs (all labs ordered are listed, but only abnormal results are displayed) Labs Reviewed  CBC - Abnormal; Notable for the following:       Result Value   RBC 5.32 (*)    Hemoglobin 11.4 (*)    MCV 69.9 (*)    MCH 21.4 (*)    RDW 17.2 (*)    All other components within normal limits  COMPREHENSIVE METABOLIC PANEL - Abnormal; Notable for the following:    Glucose, Bld 105 (*)    BUN 25 (*)    Creatinine, Ser 1.41 (*)    Total Bilirubin 0.2 (*)    GFR calc non Af Amer 40 (*)    GFR calc Af Amer 47 (*)    All other components within normal limits  CBG MONITORING, ED - Abnormal; Notable for the following:    Glucose-Capillary 136 (*)    All other components within normal limits    EKG  EKG Interpretation  Date/Time:  Tuesday Sep 19 2016 16:44:30 EDT Ventricular Rate:  93 PR Interval:  168 QRS Duration: 72 QT Interval:  390 QTC Calculation: 484 R Axis:   -77 Text Interpretation:  Normal sinus rhythm Possible Left atrial enlargement Left anterior fascicular block Septal infarct , age undetermined Abnormal ECG No significant change since last tracing Confirmed by FLOYD MD, Reuel Boom ([id number]) on 09/19/2016 5:03:40 PM       Radiology No results found.  Procedures Procedures (including critical care time)  Medications Ordered in ED Medications  sodium chloride 0.9 % bolus 1,000 mL (1,000 mLs Intravenous New Bag/Given 09/19/16 1853)     Initial Impression / Assessment and Plan / ED Course  I have reviewed the triage vital signs and the nursing notes.  Pertinent labs & imaging results that were available during my care of the patient were reviewed by me and considered in my medical decision making (see chart for details).  Orthostatic syncope in patient with reduced PO intake and UTI.  Does not report  She is well appearing and back to baseline.  No focal neurologic deficits to suggest stroke, no postictal deficits to raise concern  for seizure. -CBC -BMP -1L NS bolus  Mild AKI and mildly worsening microcytic anemia.  Denies melena and hematochezia -Patient declines DRE at this time  Able to ambulate safely without symptoms, feels at baseline.  Final Clinical Impressions(s) / ED Diagnoses   Final diagnoses:  Orthostatic hypotension   Orthostatic hypotension secondary to reduced PO intake.  Mild AKI likely also prerenal from volume depletion.  She also has a mild microcytic anemia, concern for chronic GI bleed but no history of melena or hematochezia, no evidence of active or hemodynamically significant bleeding.  -f/u with PCP within 1 week for repeat CBC and BMP -hold amlodipine until PCP follow-up -take nitrofurantoin as prescribed previously   New Prescriptions Current Discharge Medication List       Alm Bustard, MD 09/19/16 2119    Melene Plan, DO 09/19/16 2321

## 2016-09-19 NOTE — ED Notes (Signed)
Pt ambulated in hall self efficiently with little difficulty.

## 2016-09-19 NOTE — ED Notes (Signed)
Pt placed on the bedpan with the assistance of Bre (Tech).  Pericare performed after the pt's bowel movement.

## 2016-09-19 NOTE — ED Triage Notes (Signed)
Pt brought in by family that is not present currently for syncope; pt unsure why she is hear and appears paranoid and anxious

## 2016-09-19 NOTE — Discharge Instructions (Addendum)
I think you felt weak and passed out due to dehydration causing your blood pressure to go low.    Make sure you eat well and drink plenty of water.  Call your PCP to schedule an appointment as soon as possibly, preferably in the next week.  Stop taking amlodipine until you see your PCP, then ask if you should resume it.

## 2016-09-20 ENCOUNTER — Telehealth: Payer: Self-pay

## 2016-09-20 NOTE — Telephone Encounter (Signed)
Contacted pt to go over urine culture results pt daughter is aware and doesn't have any questions or concerns

## 2016-09-24 ENCOUNTER — Encounter (HOSPITAL_COMMUNITY): Payer: Self-pay

## 2016-09-24 ENCOUNTER — Emergency Department (HOSPITAL_COMMUNITY)
Admission: EM | Admit: 2016-09-24 | Discharge: 2016-09-24 | Disposition: A | Discharge status: Home / Self Care | Payer: Medicaid Other | Attending: Emergency Medicine | Admitting: Emergency Medicine

## 2016-09-24 DIAGNOSIS — R531 Weakness: Secondary | ICD-10-CM | POA: Diagnosis not present

## 2016-09-24 DIAGNOSIS — I10 Essential (primary) hypertension: Secondary | ICD-10-CM | POA: Insufficient documentation

## 2016-09-24 DIAGNOSIS — Z8673 Personal history of transient ischemic attack (TIA), and cerebral infarction without residual deficits: Secondary | ICD-10-CM | POA: Insufficient documentation

## 2016-09-24 LAB — URINALYSIS, ROUTINE W REFLEX MICROSCOPIC
Bilirubin Urine: NEGATIVE
Glucose, UA: NEGATIVE mg/dL
Hgb urine dipstick: NEGATIVE
Ketones, ur: NEGATIVE mg/dL
Leukocytes, UA: NEGATIVE
Nitrite: NEGATIVE
Protein, ur: NEGATIVE mg/dL
Specific Gravity, Urine: 1.014 (ref 1.005–1.030)
pH: 6 (ref 5.0–8.0)

## 2016-09-24 LAB — CBC WITH DIFFERENTIAL/PLATELET
Basophils Absolute: 0 10*3/uL (ref 0.0–0.1)
Basophils Relative: 0 %
Eosinophils Absolute: 0 10*3/uL (ref 0.0–0.7)
Eosinophils Relative: 0 %
HCT: 35.8 % — ABNORMAL LOW (ref 36.0–46.0)
Hemoglobin: 10.9 g/dL — ABNORMAL LOW (ref 12.0–15.0)
Lymphocytes Relative: 34 %
Lymphs Abs: 1.3 10*3/uL (ref 0.7–4.0)
MCH: 21.5 pg — ABNORMAL LOW (ref 26.0–34.0)
MCHC: 30.4 g/dL (ref 30.0–36.0)
MCV: 70.8 fL — ABNORMAL LOW (ref 78.0–100.0)
Monocytes Absolute: 0.2 10*3/uL (ref 0.1–1.0)
Monocytes Relative: 5 %
Neutro Abs: 2.4 10*3/uL (ref 1.7–7.7)
Neutrophils Relative %: 61 %
Platelets: 183 10*3/uL (ref 150–400)
RBC: 5.06 MIL/uL (ref 3.87–5.11)
RDW: 16.9 % — ABNORMAL HIGH (ref 11.5–15.5)
WBC: 3.9 10*3/uL — ABNORMAL LOW (ref 4.0–10.5)

## 2016-09-24 LAB — COMPREHENSIVE METABOLIC PANEL
ALT: 18 U/L (ref 14–54)
AST: 18 U/L (ref 15–41)
Albumin: 3.5 g/dL (ref 3.5–5.0)
Alkaline Phosphatase: 57 U/L (ref 38–126)
Anion gap: 8 (ref 5–15)
BUN: 13 mg/dL (ref 6–20)
CO2: 26 mmol/L (ref 22–32)
Calcium: 9 mg/dL (ref 8.9–10.3)
Chloride: 107 mmol/L (ref 101–111)
Creatinine, Ser: 0.99 mg/dL (ref 0.44–1.00)
GFR calc Af Amer: >60 mL/min (ref >60)
GFR calc non Af Amer: >60 mL/min (ref >60)
Glucose, Bld: 76 mg/dL (ref 65–99)
Potassium: 3.9 mmol/L (ref 3.5–5.1)
Sodium: 141 mmol/L (ref 135–145)
Total Bilirubin: 0.6 mg/dL (ref 0.3–1.2)
Total Protein: 7.5 g/dL (ref 6.5–8.1)

## 2016-09-24 LAB — I-STAT CG4 LACTIC ACID, ED: Lactic Acid, Venous: 0.85 mmol/L (ref 0.5–1.9)

## 2016-09-24 MED ORDER — SODIUM CHLORIDE 0.9 % IV BOLUS (SEPSIS)
1000.0000 mL | Freq: Once | INTRAVENOUS | Status: AC
  Administered 2016-09-24: 1000 mL via INTRAVENOUS

## 2016-09-24 NOTE — ED Notes (Signed)
Pt verbalized understanding discharge instructions and denies any further needs or questions at this time. VS stable 

## 2016-09-24 NOTE — ED Provider Notes (Signed)
[facility]-EMERGENCY DEPT Provider Note   CSN: [id number] Arrival date & time: 09/24/16  1209     History   Chief Complaint Chief Complaint  Patient presents with  . Weakness    HPI Erica Lopez is a 56 y.o. female.  HPI Patient presents to the emergency department with dizziness and headache that occurred while she was in the shower today.  Patient does not give me a better history.  The patient states that she just did not feel well, but feels better at this point.  The daughter states the patient is at her baseline.  Patient does not interact very much with me.  The daughter states she has not had any nausea, vomiting, chest pain, shortness of breath, blurred vision, abdominal pain, nausea, vomiting, diarrhea, fever, cough, or syncope.  Past Medical History:  Diagnosis Date  . Hypertension   . Stroke [facility](HCC) 2014    Patient Active Problem List   Diagnosis Date Noted  . Protein calorie malnutrition (HCC) 08/10/2016  . History of stroke 07/13/2016  . Tinea pedis 07/13/2016  . Underweight 07/13/2016  . Hypertension 07/13/2016    History reviewed. No pertinent surgical history.  OB History    No data available       Home Medications    Prior to Admission medications   Medication Sig Start Date End Date Taking? Authorizing Provider  lisinopril (PRINIVIL,ZESTRIL) 40 MG tablet Take 1 tablet (40 mg total) by mouth daily. 08/10/16  Yes Jaclyn ShaggyAmao, Enobong, MD  mirtazapine (REMERON) 15 MG tablet Take 1 tablet (15 mg total) by mouth at bedtime. 08/10/16  Yes Jaclyn ShaggyAmao, Enobong, MD    Family History History reviewed. No pertinent family history.  Social History Social History  Substance Use Topics  . Smoking status: Never Smoker  . Smokeless tobacco: Never Used  . Alcohol use No     Allergies   Aspirin and Other   Review of Systems Review of Systems  Level V caveat applies due to patient not answering questions Physical Exam Updated Vital Signs BP (!) 136/105   Pulse 79    Temp 98.1 F (36.7 C) (Oral)   Resp 19   SpO2 100%   Physical Exam  Constitutional: She is oriented to person, place, and time. She appears well-developed and well-nourished. No distress.  HENT:  Head: Normocephalic and atraumatic.  Mouth/Throat: Oropharynx is clear and moist.  Eyes: Pupils are equal, round, and reactive to light.  Neck: Normal range of motion. Neck supple.  Cardiovascular: Normal rate, regular rhythm and normal heart sounds.  Exam reveals no gallop and no friction rub.   No murmur heard. Pulmonary/Chest: Effort normal and breath sounds normal. No respiratory distress. She has no wheezes.  Abdominal: Soft. Bowel sounds are normal. She exhibits no distension. There is no tenderness.  Neurological: She is alert and oriented to person, place, and time. She exhibits normal muscle tone. Coordination normal.  Skin: Skin is warm and dry. Capillary refill takes less than 2 seconds. No rash noted. No erythema.  Psychiatric: She has a normal mood and affect. Her behavior is normal.  Nursing note and vitals reviewed.    ED Treatments / Results  Labs (all labs ordered are listed, but only abnormal results are displayed) Labs Reviewed  COMPREHENSIVE METABOLIC PANEL  CBC WITH DIFFERENTIAL/PLATELET  URINALYSIS, ROUTINE W REFLEX MICROSCOPIC  I-STAT CG4 LACTIC ACID, ED    EKG  EKG Interpretation None       Radiology No results found.  Procedures Procedures (including  critical care time)  Medications Ordered in ED Medications  sodium chloride 0.9 % bolus 1,000 mL (1,000 mLs Intravenous New Bag/Given 09/24/16 1555)     Initial Impression / Assessment and Plan / ED Course  I have reviewed the triage vital signs and the nursing notes.  Pertinent labs & imaging results that were available during my care of the patient were reviewed by me and considered in my medical decision making (see chart for details).     Patient most likely will be discharged home.  Patient  states she has a normal examination.  Waiting on laboratory testing, to come back.  Her vital signs been normal.  Patient has not had any syncope.   Final Clinical Impressions(s) / ED Diagnoses   Final diagnoses:  None    New Prescriptions New Prescriptions   No medications on file     Charlestine Night, Cordelia Poche 09/24/16 1617    Blane Ohara, MD 09/24/16 1840

## 2016-09-24 NOTE — Discharge Instructions (Signed)
Finish antibiotics, stay well-hydrated. Return to the emergency department for new or worsening symptoms. Follow with your primary doctor closely.  If you were given medicines take as directed.  If you are on coumadin or contraceptives realize their levels and effectiveness is altered by many different medicines.  If you have any reaction (rash, tongues swelling, other) to the medicines stop taking and see a physician.    If your blood pressure was elevated in the ER make sure you follow up for management with a primary doctor or return for chest pain, shortness of breath or stroke symptoms.  Please follow up as directed and return to the ER or see a physician for new or worsening symptoms.  Thank you. Vitals:   09/24/16 1615 09/24/16 1700 09/24/16 1730 09/24/16 1830  BP: (!) 148/94 (!) 152/99 (!) 145/96 (!) 156/100  Pulse: 72 71 77 73  Resp: 15 15 12 20   Temp:      TempSrc:      SpO2: 100% 100% 100% 100%

## 2016-09-24 NOTE — ED Notes (Signed)
Assisted pt using bedpan for UA sample. No difficulties.

## 2016-09-24 NOTE — ED Triage Notes (Signed)
Pt reports she was taking shower and started feeling weak.  No LOC. Is accompanied by daughter who states "at baseline".  UTI symptoms seem worse, still on antibiotics.

## 2016-10-05 ENCOUNTER — Ambulatory Visit: Payer: Medicaid Other | Attending: Family Medicine | Admitting: Family Medicine

## 2016-10-05 ENCOUNTER — Encounter: Payer: Self-pay | Admitting: Family Medicine

## 2016-10-05 VITALS — BP 170/90 | HR 75 | Temp 97.9°F | Resp 18 | Ht 63.0 in | Wt 94.8 lb

## 2016-10-05 DIAGNOSIS — E43 Unspecified severe protein-calorie malnutrition: Secondary | ICD-10-CM | POA: Insufficient documentation

## 2016-10-05 DIAGNOSIS — Z8673 Personal history of transient ischemic attack (TIA), and cerebral infarction without residual deficits: Secondary | ICD-10-CM | POA: Diagnosis not present

## 2016-10-05 DIAGNOSIS — Z9114 Patient's other noncompliance with medication regimen: Secondary | ICD-10-CM | POA: Diagnosis not present

## 2016-10-05 DIAGNOSIS — I10 Essential (primary) hypertension: Secondary | ICD-10-CM

## 2016-10-05 DIAGNOSIS — Z79899 Other long term (current) drug therapy: Secondary | ICD-10-CM | POA: Insufficient documentation

## 2016-10-05 MED ORDER — MIRTAZAPINE 30 MG PO TABS: 30.0000 mg | ORAL_TABLET | Freq: Every day | ORAL | 3 refills | Status: DC

## 2016-10-05 NOTE — Progress Notes (Signed)
Patient is here for HTN  Patient has not taken medication today. Patient has eaten today.  Patient denies pain at this time.  Patient refused interventional medication to address HIGH BP.

## 2016-10-05 NOTE — Progress Notes (Addendum)
Subjective:  Patient ID: Erica Lopez, female    DOB: [birth year]  Age: 56 y.o. MRN: [id number]  CC: Hypertension   HPI Erica Lopez a 55 year old female who was Medical history is significant for CVA in 2014 with resulting mild cognitive deficits and no hemiparesis, Hypertension, Protein calorie malnutrition who comes in for a follow-up visit.  Her blood pressure is elevated today and she endorses not taking her lisinopril with no reason for this. She was previously on amlodipine as well which was discontinued by the ED physician due to hypotension.   Her daughter is still concerned about the fact that her mom is yet to gain weight As per the patient she hasn't found what she likes to eat. She answers in monosyllables as is typical of her. Daughter would also like PCS services to assist with care of her mom and is requesting a PT referral as her mom does have an abnormal gait and "walks as if she is in pain".  She complains that the patient Is not very active  Past Medical History:  Diagnosis Date  . Hypertension   . Stroke [facility]) 2014    History reviewed. No pertinent surgical history.  Allergies  Allergen Reactions  . Aspirin Anaphylaxis and Other (See Comments)    Bleeding event  . Other Hives, Swelling and Other (See Comments)    Blueberries, causing hives and swelling     Outpatient Medications Prior to Visit  Medication Sig Dispense Refill  . lisinopril (PRINIVIL,ZESTRIL) 40 MG tablet Take 1 tablet (40 mg total) by mouth daily. 90 tablet 1  . mirtazapine (REMERON) 15 MG tablet Take 1 tablet (15 mg total) by mouth at bedtime. 90 tablet 1   No facility-administered medications prior to visit.     ROS Review of Systems  Constitutional: Negative for activity change, appetite change and fatigue.  HENT: Negative for congestion, sinus pressure and sore throat.   Eyes: Negative for visual disturbance.  Respiratory: Negative for cough, chest tightness, shortness of breath  and wheezing.   Cardiovascular: Negative for chest pain and palpitations.  Gastrointestinal: Negative for abdominal distention, abdominal pain and constipation.  Endocrine: Negative for polydipsia.  Genitourinary: Negative for dysuria and frequency.  Musculoskeletal: Positive for gait problem. Negative for arthralgias and back pain.  Skin: Negative for rash.  Neurological: Negative for tremors, light-headedness and numbness.  Hematological: Does not bruise/bleed easily.  Psychiatric/Behavioral: Negative for agitation and behavioral problems.    Objective:  BP (!) 170/90 (BP Location: Right Arm, Patient Position: Sitting, Cuff Size: Small)   Pulse 75   Temp 97.9 F (36.6 C) (Oral)   Resp 18   Ht 5\' 3"  (1.6 m)   Wt 94 lb 12.8 oz (43 kg)   SpO2 100%   BMI 16.79 kg/m   BP/Weight 10/05/2016 09/24/2016 09/19/2016  Systolic BP 170 156 139  Diastolic BP 90 100 84  Wt. (Lbs) 94.8 - -  BMI 16.79 - -      Physical Exam Constitutional: She is oriented to person, place, and time. She appearsThin  Cardiovascular: Normal rate, normal heart sounds and intact distal pulses.   No murmur heard. Pulmonary/Chest: Effort normal and breath sounds normal. She has no wheezes. She has no rales. She exhibits no tenderness.  Abdominal: Soft. Bowel sounds are normal. She exhibits no distension and no mass. There is no tenderness.  Musculoskeletal: Normal range of motion.  Neurological: She is alert and oriented to person, place, and time.  Psych: Poor judgment  Assessment & Plan:   1. Severe protein-calorie malnutrition (HCC) Poor oral intake secondary Underlying cognitive deficit poststroke be contributory Hopefully increase in dose of Remeron should help  2. History of stroke Abnormal gait Will refer to PT as per patient request and also placed a referral for PCS services - Ambulatory referral to Physical Therapy  3. Essential hypertension Uncontrolled due to noncompliance Advised to comply  with lisinopril and low-sodium diet We'll reassess blood pressure at next visit if still elevated will add on amlodipine  4. Noncompliance with medication regimen Stressed importance of compliance with medication Patient does have some form of poor judgment   Meds ordered this encounter  Medications  . mirtazapine (REMERON) 30 MG tablet    Sig: Take 1 tablet (30 mg total) by mouth at bedtime.    Dispense:  30 tablet    Refill:  3    Discontinue previous dose    Follow-up: Return in about 1 month (around 11/05/2016) for Follow-up of hypertension.   Jaclyn ShaggyEnobong Amao MD

## 2016-10-05 NOTE — Patient Instructions (Signed)

## 2016-10-17 ENCOUNTER — Telehealth: Payer: Self-pay

## 2016-10-17 NOTE — Telephone Encounter (Signed)
Completed PCS form faxed to [facility] - fax # [phone] 

## 2016-10-19 ENCOUNTER — Telehealth: Payer: Self-pay

## 2016-10-19 NOTE — Telephone Encounter (Signed)
Call placed to [facility] # [phone] to confirm receipt of the referral.  Spoke to [place]Janna who stated that the referral was received and the patient is scheduled for an assessment on 10/26/16 @ 0830.

## 2016-10-23 ENCOUNTER — Ambulatory Visit: Payer: Medicaid Other | Admitting: Physical Therapy

## 2016-10-30 ENCOUNTER — Ambulatory Visit: Payer: Medicaid Other | Attending: Family Medicine | Admitting: Physical Therapy

## 2016-10-30 DIAGNOSIS — R2681 Unsteadiness on feet: Secondary | ICD-10-CM | POA: Insufficient documentation

## 2016-10-30 DIAGNOSIS — M6281 Muscle weakness (generalized): Secondary | ICD-10-CM | POA: Insufficient documentation

## 2016-10-30 DIAGNOSIS — R2689 Other abnormalities of gait and mobility: Secondary | ICD-10-CM

## 2016-10-30 DIAGNOSIS — R293 Abnormal posture: Secondary | ICD-10-CM | POA: Insufficient documentation

## 2016-10-30 NOTE — Therapy (Signed)
[facility] [address] [place], [place], [address] Phone: [phone]   Fax:  [phone]  Physical Therapy Evaluation  Patient Details  Name: Erica Lopez MRN: [id number] Date of Birth: 12/12/1960 Referring Provider: Jaclyn Shaggy  Encounter Date: 10/30/2016      PT End of Session - 10/30/16 1001    Visit Number 1   Number of Visits 4   Date for PT Re-Evaluation 12/29/16   Authorization Type Medicaid   PT Start Time 0805   PT Stop Time 0845   PT Time Calculation (min) 40 min   Activity Tolerance Patient tolerated treatment well   Behavior During Therapy [facility] for tasks assessed/performed      Past Medical History:  Diagnosis Date  . Hypertension   . Stroke [facility]) 2014    No past surgical history on file.  There were no vitals filed for this visit.       Subjective Assessment - 10/30/16 0810    Subjective Pt reports she wants to get her walking down good again.  She had problems with toenails recently and she was walking funny before getting that taken care of.  Daughter assists with history.  Daughter reports 3 falls in the past 6 months-one fall from toilet and 2 falls due to fainting in a parking lot.   Patient is accompained by: Family member  daughter   Pertinent History History of CVA 2014, HTN   Patient Stated Goals Get back to cooking, walking better.   Currently in Pain? No/denies            [facility] PT Assessment - 10/30/16 0815      Assessment   Medical Diagnosis History of stroke   Referring Provider Jaclyn Shaggy   Onset Date/Surgical Date 10/05/16  MD visit     Precautions   Precautions Fall     Balance Screen   Has the patient fallen in the past 6 months Yes   How many times? 3   Has the patient had a decrease in activity level because of a fear of falling?  No   Is the patient reluctant to leave their home because of a fear of falling?  No     Home Environment   Living  Environment Private residence   Living Arrangements Children   Available Help at Discharge Family  Will have aide services coming in home   Type of Home Apartment   Home Access Stairs to enter   Entrance Stairs-Number of Steps --  flight of stairs into apartment-increased time per dtr   Entrance Stairs-Rails Right;Left;Cannot reach both   Home Layout One level   Home Equipment None     Prior Function   Level of Independence Independent with basic ADLs;Independent with household mobility without device  increased time     Cognition   Overall Cognitive Status History of cognitive impairments - at baseline   Area of Impairment --     Observation/Other Assessments   Focus on Therapeutic Outcomes (FOTO)  NA     Posture/Postural Control   Posture/Postural Control Postural limitations   Postural Limitations Rounded Shoulders;Forward head;Posterior pelvic tilt   Posture Comments Holds L UE in elbow flexion and fisted, but able to move out of upon command     ROM / Strength   AROM / PROM / Strength Strength     Strength   Overall Strength Deficits   Overall Strength Comments Grossly tested at least 4/5 bilateral lower extremities  Transfers   Transfers Sit to Stand;Stand to Sit   Sit to Stand 6: Modified independent (Device/Increase time);With upper extremity assist;Without upper extremity assist;From chair/3-in-1   Stand to Sit 6: Modified independent (Device/Increase time);With upper extremity assist;Without upper extremity assist;To chair/3-in-1     Ambulation/Gait   Ambulation/Gait Yes   Ambulation/Gait Assistance 5: Supervision   Ambulation/Gait Assistance Details Pt holds hands in mid-high guard and has guarded gait pattern   Ambulation Distance (Feet) 200 Feet   Assistive device None   Gait Pattern Step-through pattern;Decreased arm swing - right;Decreased arm swing - left;Decreased step length - right;Decreased step length - left;Decreased dorsiflexion -  right;Decreased dorsiflexion - left;Right foot flat;Left foot flat;Decreased trunk rotation;Narrow base of support   Ambulation Surface Level;Indoor   Gait velocity 18.06 sec = 1.82   Stairs Yes   Stair Management Technique One rail Right;Alternating pattern   Number of Stairs 4   Height of Stairs 6     Standardized Balance Assessment   Standardized Balance Assessment Timed Up and Go Test;Dynamic Gait Index     Dynamic Gait Index   Level Surface Moderate Impairment   Change in Gait Speed Mild Impairment   Gait with Horizontal Head Turns Moderate Impairment   Gait with Vertical Head Turns Moderate Impairment   Gait and Pivot Turn Mild Impairment  4.35   Step Over Obstacle Moderate Impairment   Step Around Obstacles Moderate Impairment   Steps Mild Impairment   Total Score 11   DGI comment: Scores <19/24 indicate increased fall risk     Timed Up and Go Test   Normal TUG (seconds) 33.78   TUG Comments Scores >13.5 sec indicate increased fall risk; scores >30 sec indicate increased difficulty with ADLs in home     High Level Balance   High Level Balance Comments SLS R and L 3 seconds; tandem stance 30 seconds            Objective measurements completed on examination: See above findings.                       PT Long Term Goals - 10/30/16 1129      PT LONG TERM GOAL #1   Title Pt will perform HEP with family supervision to address balance, gait and strength.  TARGET 12/29/16   Baseline no formal HEP at this time; decreased lower extremity strength, decreased balance per DGI, TUG   Time 8  Requested 3 visits over 8 weeks   Period Weeks   Status New     PT LONG TERM GOAL #2   Title Pt will improved gait velocity to at least 2 ft/sec for improved gait efficiency/decreased fall risk.   Baseline Gait velocity 1.82 ft/sec (indicates increased fall risk; 1.31-2.62 ft/sec indicate limited community ambulator)   Time 8   Period Weeks   Status New     PT  LONG TERM GOAL #3   Title Pt will improve TUG score to less than or equal to 25 seconds for decreased fall risk.   Baseline TUG score 33.78 sec (>30 sec indicates difficulty with ADLs in home; >13.5 sec indicates increased fall risk)   Time 8   Period Weeks   Status New     PT LONG TERM GOAL #4   Title Pt will improve DGI to at least 15/24 for decreased fall risk.   Baseline DGI score is 11/24 (<19/24 indicates increased fall risk.)   Time 8   Period  Weeks   Status New     PT LONG TERM GOAL #5   Title Pt/daughter will verbalize understanding of fall prevention in home environment.     Time 8   Status New                Plan - 10/30/16 1002    Clinical Impression Statement Pt is a 56 year old female who presents to OP PT following MD visit on 10/05/16, with history of CVA (cerbral infarction, unspecificed I63.8) 2014, with gait difficulties and 3 falls in the past 6 months.  Pt was moved to [facility] prior to February 2018 to live with her daughter (previous records unavailable).  Pt is at risk of falls per TUG score of 33.78 sec, DGI score of 11/24 and gait velocity score of 1.8 ft/sec.  Pt presents with slowed and guarded gait, with decreased balance, decreased lower extremity strength, abnormal posture.  Pt would benefit from skilled PT to address the above stated deficits to improve functional mobility and decrease fall risk.   History and Personal Factors relevant to plan of care: PMH HTN, CVA 2014, anorexia; 3 falls in the past 6 months   Clinical Presentation Evolving   Clinical Presentation due to: at least 3 cor-morbidities, 3 falls in the past 6 months, incr. fall risk per 3 objective measures as above   Clinical Decision Making Moderate   Rehab Potential Fair   Clinical Impairments Affecting Rehab Potential history of cognitive impairments at baseline; daugther present for eval   PT Frequency 1x / week   PT Duration Other (comment)  for 3 visits over the course of 8  weeks   PT Treatment/Interventions ADLs/Self Care Home Management;Functional mobility training;Stair training;Gait training;DME Instruction;Patient/family education;Neuromuscular re-education;Balance training;Therapeutic exercise;Therapeutic activities   PT Next Visit Plan Initiate HEP to address strength, balance and gait   Consulted and Agree with Plan of Care Patient;Family member/caregiver   Family Member Consulted daughter      Patient will benefit from skilled therapeutic intervention in order to improve the following deficits and impairments:  Abnormal gait, Decreased balance, Decreased mobility, Decreased strength, Decreased safety awareness, Difficulty walking, Postural dysfunction  Visit Diagnosis: Other abnormalities of gait and mobility  Muscle weakness (generalized)  Unsteadiness on feet  Abnormal posture     Problem List Patient Active Problem List   Diagnosis Date Noted  . Protein calorie malnutrition (HCC) 08/10/2016  . History of stroke 07/13/2016  . Tinea pedis 07/13/2016  . Underweight 07/13/2016  . Hypertension 07/13/2016    Erica Lopez W. 10/30/2016, 11:36 AM Gean Maidens., PT  [facility] [address] [place], [place], [address] Phone: [phone]   Fax:  [phone]  Name: Erica Lopez MRN: [id number] Date of Birth: [birth year]

## 2016-11-06 ENCOUNTER — Ambulatory Visit: Payer: Medicaid Other | Admitting: Family Medicine

## 2016-11-13 ENCOUNTER — Encounter: Payer: Self-pay | Admitting: Family Medicine

## 2016-11-13 ENCOUNTER — Ambulatory Visit: Payer: Medicaid Other | Admitting: Physical Therapy

## 2016-11-13 ENCOUNTER — Ambulatory Visit: Payer: Medicaid Other | Attending: Family Medicine | Admitting: Family Medicine

## 2016-11-13 VITALS — BP 178/119 | HR 77 | Temp 97.9°F | Wt 94.2 lb

## 2016-11-13 DIAGNOSIS — R2681 Unsteadiness on feet: Secondary | ICD-10-CM

## 2016-11-13 DIAGNOSIS — Z8673 Personal history of transient ischemic attack (TIA), and cerebral infarction without residual deficits: Secondary | ICD-10-CM | POA: Insufficient documentation

## 2016-11-13 DIAGNOSIS — E43 Unspecified severe protein-calorie malnutrition: Secondary | ICD-10-CM | POA: Insufficient documentation

## 2016-11-13 DIAGNOSIS — Z886 Allergy status to analgesic agent status: Secondary | ICD-10-CM | POA: Insufficient documentation

## 2016-11-13 DIAGNOSIS — Z91018 Allergy to other foods: Secondary | ICD-10-CM | POA: Insufficient documentation

## 2016-11-13 DIAGNOSIS — I10 Essential (primary) hypertension: Secondary | ICD-10-CM | POA: Diagnosis not present

## 2016-11-13 DIAGNOSIS — R2689 Other abnormalities of gait and mobility: Secondary | ICD-10-CM | POA: Diagnosis not present

## 2016-11-13 DIAGNOSIS — M6281 Muscle weakness (generalized): Secondary | ICD-10-CM

## 2016-11-13 MED ORDER — LISINOPRIL 40 MG PO TABS: 40.0000 mg | ORAL_TABLET | Freq: Every day | ORAL | 5 refills | Status: DC

## 2016-11-13 MED ORDER — AMLODIPINE BESYLATE 5 MG PO TABS: 5.0000 mg | ORAL_TABLET | Freq: Every day | ORAL | 5 refills | Status: DC

## 2016-11-13 NOTE — Progress Notes (Signed)
Subjective:  Patient ID: Erica Lopez, female    DOB: August 05, 1960  Age: 56 y.o. MRN: [id number]  CC: Hypertension   HPI Erica Lopez is a 56 year old female with Medical history significant for CVA in 2014 with resulting mild cognitive deficits and no hemiparesis, Hypertension, Protein calorie malnutrition who comes in for a follow-up Of her hypertension.  Her blood pressure remains elevated and she is yet to take her morning dose of antihypertensive. She is accompanied by her daughter today.  She has a scar on the medial aspect of both knees which started off as a boil which she applied alcohol to. Lesion is normal.  Past Medical History:  Diagnosis Date  . Hypertension   . Stroke [facility](HCC) 2014    No past surgical history on file.  Allergies  Allergen Reactions  . Aspirin Anaphylaxis and Other (See Comments)    Bleeding event  . Other Hives, Swelling and Other (See Comments)    Blueberries, causing hives and swelling      Outpatient Medications Prior to Visit  Medication Sig Dispense Refill  . mirtazapine (REMERON) 30 MG tablet Take 1 tablet (30 mg total) by mouth at bedtime. 30 tablet 3  . lisinopril (PRINIVIL,ZESTRIL) 40 MG tablet Take 1 tablet (40 mg total) by mouth daily. 90 tablet 1   No facility-administered medications prior to visit.     ROS Review of Systems Constitutional: Negative for activity change, appetite change and fatigue.  HENT: Negative for congestion, sinus pressure and sore throat.   Eyes: Negative for visual disturbance.  Respiratory: Negative for cough, chest tightness, shortness of breath and wheezing.   Cardiovascular: Negative for chest pain and palpitations.  Gastrointestinal: Negative for abdominal distention, abdominal pain and constipation.  Endocrine: Negative for polydipsia.  Genitourinary: Negative for dysuria and frequency.  Musculoskeletal: Negative for arthralgias and back pain.  Skin: Positive for rash.  Neurological: Negative for  tremors, light-headedness and numbness.  Hematological: Does not bruise/bleed easily.  Psychiatric/Behavioral: Negative for agitation and behavioral problems.   Objective:  BP (!) 178/119   Pulse 77   Temp 97.9 F (36.6 C) (Oral)   Wt 94 lb 3.2 oz (42.7 kg)   SpO2 99%   BMI 16.69 kg/m   BP/Weight 11/13/2016 10/05/2016 09/24/2016  Systolic BP 178 170 156  Diastolic BP 119 90 100  Wt. (Lbs) 94.2 94.8 -  BMI 16.69 16.79 -      Physical Exam Constitutional: She is oriented to person, place, and time. She appearsThin  Cardiovascular: Normal rate, normal heart sounds and intact distal pulses.   No murmur heard. Pulmonary/Chest: Effort normal and breath sounds normal. She has no wheezes. She has no rales. She exhibits no tenderness.  Abdominal: Soft. Bowel sounds are normal. She exhibits no distension and no mass. There is no tenderness.  Musculoskeletal: Normal range of motion.  Neurological: She is alert and oriented to person, place, and time.  Skin: Nontender linea scar on medial aspect of both knees Psych: Poor judgment  Assessment & Plan:   1. Essential hypertension Uncontrolled Amlodipine added to regimen Low-sodium diet - lisinopril (PRINIVIL,ZESTRIL) 40 MG tablet; Take 1 tablet (40 mg total) by mouth daily.  Dispense: 30 tablet; Refill: 5 - amLODipine (NORVASC) 5 MG tablet; Take 1 tablet (5 mg total) by mouth daily.  Dispense: 30 tablet; Refill: 5  2. Severe protein-calorie malnutrition (HCC) Advised to increase oral intake with the patient that she is doing  From my discussion with the patient and the daughter,  she does appear to have poor judgment and some form of underlying psychiatric disorder and I have expressed my concern with the daughter and the need to be seen by psychiatry however the patient declines.   Meds ordered this encounter  Medications  . lisinopril (PRINIVIL,ZESTRIL) 40 MG tablet    Sig: Take 1 tablet (40 mg total) by mouth daily.    Dispense:   30 tablet    Refill:  5  . amLODipine (NORVASC) 5 MG tablet    Sig: Take 1 tablet (5 mg total) by mouth daily.    Dispense:  30 tablet    Refill:  5    Follow-up: Return in about 3 months (around 02/13/2017) for Follow-up of hypertension.   This note has been created with Education officer, environmental. Any transcriptional errors are unintentional.     Jaclyn Shaggy MD

## 2016-11-13 NOTE — Patient Instructions (Addendum)
HIP: Abduction - Standing    Squeeze glutes. Raise leg out and slightly back.  Alternate kicking legs out to the side. _20__ reps per set, __1-2_ sets per day.   Hold onto a support.  Copyright  VHI. All rights reserved.  EXTENSION: Standing (Active)    Stand, both feet flat. Kick right leg behind body as far as possible, keeping your balance upright.  Alternate kicking back. Complete  _20__ repetitions. Perform _1-2__ sessions per day.  http://gtsc.exer.us/77   Copyright  VHI. All rights reserved.  HIP / KNEE: Flexion - Standing    Raise knee to chest. Keep back straight. Perform slowly. Alternate legs, like marching in place _20__ reps per set, _1-2__ sets per day.  Hold onto a support.  Copyright  VHI. All rights reserved.  Toe / Heel Raise    Gently rock back on heels and raise toes. Then rock forward on toes and raise heels. Repeat sequence __20__ times per session. Do __1-2__ sessions per week.  Copyright  VHI. All rights reserved.  Side-Stepping    At the counter-Walk to left side with eyes open. Take even steps, leading with same foot. Make sure each foot lifts off the floor. Repeat in opposite direction. Repeat for _3 times the length of your counter, each direction.  Do _1-2___ sessions per day.  Copyright  VHI. All rights reserved.  SINGLE LIMB STANCE    Hold onto counter, with the lightest amount of support you need.  Raise one leg off the floor. Hold _10__ seconds. Repeat with other leg. __3_ reps per set, __1-2_ sets per day.  Copyright  VHI. All rights reserved.

## 2016-11-13 NOTE — Patient Instructions (Signed)

## 2016-11-13 NOTE — Therapy (Signed)
[facility] [address] [place], [place], [address] Phone: [phone]   Fax:  [phone]  Physical Therapy Treatment  Patient Details  Name: Erica Lopez MRN: [id number] Date of Birth: 1960/06/07 Referring Provider: Jaclyn Shaggy  Encounter Date: 11/13/2016      PT End of Session - 11/13/16 1707    Visit Number 2   Number of Visits 4   Date for PT Re-Evaluation 12/29/16   Authorization Type Medicaid   Authorization Time Period 6/22-8/16/18   Authorization - Visit Number 1   Authorization - Number of Visits 3   PT Start Time 1020   PT Stop Time 1100   PT Time Calculation (min) 40 min   Equipment Utilized During Treatment Gait belt   Activity Tolerance Patient tolerated treatment well   Behavior During Therapy [facility] for tasks assessed/performed      Past Medical History:  Diagnosis Date  . Hypertension   . Stroke [facility]) 2014    No past surgical history on file.  There were no vitals filed for this visit.      Subjective Assessment - 11/13/16 1024    Subjective No falls since last visit.   Patient is accompained by: Family member  daughter   Pertinent History History of CVA 2014, HTN   Patient Stated Goals Get back to cooking, walking better.   Currently in Pain? No/denies                         [facility] Adult PT Treatment/Exercise - 11/13/16 0001      Ambulation/Gait   Ambulation/Gait Yes   Ambulation/Gait Assistance 5: Supervision   Ambulation/Gait Assistance Details Gait trial using bilateral walking poles to facilitate increased arm swing x 115 ft, followed by gait with no walking poles with tactile cues for facilitation of arm swing.  Pt able to briefly transition to relaxed gait pattern with reciprocal arm swing, but pt tends to revert back to short step length and same side arm swing.   Ambulation Distance (Feet) 115 Feet  x 3 reps, then 50 ft x 2   Assistive device None   Gait  Pattern Step-through pattern;Decreased arm swing - right;Decreased arm swing - left;Decreased step length - right;Decreased step length - left;Decreased dorsiflexion - right;Decreased dorsiflexion - left;Right foot flat;Left foot flat;Decreased trunk rotation;Narrow base of support   Ambulation Surface Level;Indoor   Pre-Gait Activities In parallel bars, pre-gait activities attempted with forward/back gait, but pt does not hold to bars and needs cues to relax hands out of high guard position.     Exercises   Exercises Knee/Hip     Knee/Hip Exercises: Aerobic   Other Aerobic Foot pedaler x 5 minutes (3.5 min forward, 1.5 minute backward at mod-max resistance) for lower extremity strengthening.             Balance Exercises - 11/13/16 1705      Balance Exercises: Standing   SLS Eyes open;Upper extremity support 1;3 reps;10 secs   Retro Gait 3 reps  Forward/back gait in parallel bars   Sidestepping Upper extremity support;3 reps  Along counter    Marching Limitations in place x 10 reps   Heel Raises Limitations x 20   Toe Raise Limitations x 20   Sit to Stand Time x 5 reps, cues for forward lean and upright posture to stand for functional lower extremity strengthening   Other Standing Exercises Alternating step taps side x 20 reps,  back x 20 reps, then forward step tapx x 20 reps.           PT Education - 11/13/16 1707    Education provided Yes   Education Details HEP-see instructions   Person(s) Educated Patient   Methods Explanation;Demonstration;Handout   Comprehension Verbalized understanding;Returned demonstration;Verbal cues required;Need further instruction             PT Long Term Goals - 10/30/16 1129      PT LONG TERM GOAL #1   Title Pt will perform HEP with family supervision to address balance, gait and strength.  TARGET 12/29/16   Baseline no formal HEP at this time; decreased lower extremity strength, decreased balance per DGI, TUG   Time 8  Requested  3 visits over 8 weeks   Period Weeks   Status New     PT LONG TERM GOAL #2   Title Pt will improved gait velocity to at least 2 ft/sec for improved gait efficiency/decreased fall risk.   Baseline Gait velocity 1.82 ft/sec (indicates increased fall risk; 1.31-2.62 ft/sec indicate limited community ambulator)   Time 8   Period Weeks   Status New     PT LONG TERM GOAL #3   Title Pt will improve TUG score to less than or equal to 25 seconds for decreased fall risk.   Baseline TUG score 33.78 sec (>30 sec indicates difficulty with ADLs in home; >13.5 sec indicates increased fall risk)   Time 8   Period Weeks   Status New     PT LONG TERM GOAL #4   Title Pt will improve DGI to at least 15/24 for decreased fall risk.   Baseline DGI score is 11/24 (<19/24 indicates increased fall risk.)   Time 8   Period Weeks   Status New     PT LONG TERM GOAL #5   Title Pt/daughter will verbalize understanding of fall prevention in home environment.     Time 8   Status New               Plan - 11/13/16 1708    Clinical Impression Statement Skilled therapy session focused on initiating HEP to address balance, functional strengthening, as well as gait training.  Pt has difficulty with reciprocal arm swing with gait, often holding bilateral arms by her side or in mid-high guard position.  She is able to briefly relax into reciprocal arm swing following use of bilateral walking poles to facilitate arm swing.  Pt will continue to benefit from skilled physical therapy to address balance, gait and functional strengthening.   Rehab Potential Fair   Clinical Impairments Affecting Rehab Potential history of cognitive impairments at baseline; daugther present for eval   PT Frequency 1x / week   PT Duration Other (comment)  for 3 visits over the course of 8 weeks   PT Treatment/Interventions ADLs/Self Care Home Management;Functional mobility training;Stair training;Gait training;DME  Instruction;Patient/family education;Neuromuscular re-education;Balance training;Therapeutic exercise;Therapeutic activities   PT Next Visit Plan Review HEP provided this visit; progress HEP as able for balance and functional strength (possibly add tandem stance, sit<>stand); check BP before next session (see MD notes from 11/13/16); discuss walking program.   Consulted and Agree with Plan of Care Patient;Family member/caregiver   Family Member Consulted daughter      Patient will benefit from skilled therapeutic intervention in order to improve the following deficits and impairments:  Abnormal gait, Decreased balance, Decreased mobility, Decreased strength, Decreased safety awareness, Difficulty walking, Postural dysfunction  Visit Diagnosis: Unsteadiness on feet  Other abnormalities of gait and mobility  Muscle weakness (generalized)     Problem List Patient Active Problem List   Diagnosis Date Noted  . Protein calorie malnutrition (HCC) 08/10/2016  . History of stroke 07/13/2016  . Tinea pedis 07/13/2016  . Underweight 07/13/2016  . Hypertension 07/13/2016    Lauralei Clouse W. 11/13/2016, 5:12 PM  Gean MaidensMARRIOTT,Margean Korell W., PT   [facility] [address] [place], [place], [address] Phone: [phone]   Fax:  [phone]  Name: Erica BurrowMiriam Lopez MRN: [id number] Date of Birth: 1961-05-16

## 2016-11-20 ENCOUNTER — Ambulatory Visit: Payer: Medicaid Other | Attending: Family Medicine | Admitting: Physical Therapy

## 2016-11-20 VITALS — BP 183/123 | HR 67

## 2016-11-20 DIAGNOSIS — R2689 Other abnormalities of gait and mobility: Secondary | ICD-10-CM

## 2016-11-20 DIAGNOSIS — M6281 Muscle weakness (generalized): Secondary | ICD-10-CM | POA: Insufficient documentation

## 2016-11-20 DIAGNOSIS — R2681 Unsteadiness on feet: Secondary | ICD-10-CM | POA: Insufficient documentation

## 2016-11-20 NOTE — Therapy (Signed)
[facility] [address] [place], [place], [address] Phone: [phone]   Fax:  [phone]  Physical Therapy Treatment-NO CHARGE VISIT Patient Details  Name: Erica BurrowMiriam Lopez MRN: [id number] Date of Birth: [birth year] Referring Provider: Jaclyn ShaggyAmao, Enobong  Encounter Date: 11/20/2016    Past Medical History:  Diagnosis Date  . Hypertension   . Stroke [facility](HCC) 2014    No past surgical history on file.  Vitals:   11/20/16 0939 11/20/16 0955  BP: (!) 198/129 (!) 183/123  Pulse: 68 67        Subjective Assessment - 11/20/16 0939    Subjective Doing okay today, just not one of my best days.   Patient is accompained by: Family member  daughter   Pertinent History History of CVA 2014, HTN   Patient Stated Goals Get back to cooking, walking better.   Currently in Pain? No/denies         Pt arrived and blood pressure measures taken (due to note from previous physician visit with elevated BP)-see measures above.  Pt had not taken BP medication today and took while in therapy gym.  Spoke with pt and daughter and educated that PT treatment could not continue based on pt's elevated BP today.  Pt denies any symptoms such as headache or blurred visit.  Briefly educated daughter on CVA signs/symptoms and need to call [phone] if that occurs.  Recommended pt/daughter taking BP medication as directed, take BP measure prior to coming to therapy and if diastolic number is >100, should cancel therapy session.                             PT Long Term Goals - 10/30/16 1129      PT LONG TERM GOAL #1   Title Pt will perform HEP with family supervision to address balance, gait and strength.  TARGET 12/29/16   Baseline no formal HEP at this time; decreased lower extremity strength, decreased balance per DGI, TUG   Time 8  Requested 3 visits over 8 weeks   Period Weeks   Status New     PT LONG TERM GOAL #2   Title Pt will  improved gait velocity to at least 2 ft/sec for improved gait efficiency/decreased fall risk.   Baseline Gait velocity 1.82 ft/sec (indicates increased fall risk; 1.31-2.62 ft/sec indicate limited community ambulator)   Time 8   Period Weeks   Status New     PT LONG TERM GOAL #3   Title Pt will improve TUG score to less than or equal to 25 seconds for decreased fall risk.   Baseline TUG score 33.78 sec (>30 sec indicates difficulty with ADLs in home; >13.5 sec indicates increased fall risk)   Time 8   Period Weeks   Status New     PT LONG TERM GOAL #4   Title Pt will improve DGI to at least 15/24 for decreased fall risk.   Baseline DGI score is 11/24 (<19/24 indicates increased fall risk.)   Time 8   Period Weeks   Status New     PT LONG TERM GOAL #5   Title Pt/daughter will verbalize understanding of fall prevention in home environment.     Time 8   Status New             Patient will benefit from skilled therapeutic intervention in order to improve the following deficits and impairments:  Visit Diagnosis: Other abnormalities of gait and mobility     Problem List Patient Active Problem List   Diagnosis Date Noted  . Protein calorie malnutrition (HCC) 08/10/2016  . History of stroke 07/13/2016  . Tinea pedis 07/13/2016  . Underweight 07/13/2016  . Hypertension 07/13/2016    Serenidy Waltz W. 11/20/2016, 9:59 AM Gean Maidens., PT [facility] [address] [place], [place], [address] Phone: [phone]   Fax:  [phone]  Name: Erica Lopez MRN: [id number] Date of Birth: 06/18/1960

## 2016-11-27 ENCOUNTER — Ambulatory Visit: Payer: Medicaid Other | Admitting: Physical Therapy

## 2016-12-06 ENCOUNTER — Ambulatory Visit: Payer: Medicaid Other | Admitting: Physical Therapy

## 2016-12-06 VITALS — BP 146/98

## 2016-12-06 DIAGNOSIS — R2689 Other abnormalities of gait and mobility: Secondary | ICD-10-CM

## 2016-12-06 DIAGNOSIS — M6281 Muscle weakness (generalized): Secondary | ICD-10-CM

## 2016-12-06 DIAGNOSIS — R2681 Unsteadiness on feet: Secondary | ICD-10-CM

## 2016-12-06 NOTE — Patient Instructions (Addendum)
KNEE: Extension, Long Arc Quads - Sitting    Raise leg until knee is straight.  Hold for 3 seconds. _10__ reps per set, __1-2_ sets per day.  Copyright  VHI. All rights reserved.  Marching    Sitting on the edge of a chair march in place for 20 times. Do _1-2__ times per day.  Copyright  VHI. All rights reserved.  Functional Quadriceps: Sit to Stand    Sit on edge of chair, feet flat on floor. Lean forward and stand upright, extending knees fully.  Hold 3 seconds in fully upright standing position. Repeat _5-10__ times per set. Do _1-2__ times per day..   http://orth.exer.us/735   Copyright  VHI. All rights reserved.    WALKING  Walking is a great form of exercise to increase your strength, endurance and overall fitness.  A walking program can help you start slowly and gradually build endurance as you go.  Everyone's ability is different, so each person's starting point will be different.  You do not have to follow them exactly.  The are just samples. You should simply find out what's right for you and stick to that program.   In the beginning, you'll start off walking 2-3 times a day for short distances.  As you get stronger, you'll be walking further at just 1-2 times per day.  A. You Can Walk For A Certain Length Of Time Each Day    Walk 5 minutes 3 times per day.  Increase 2 minutes every 2 days (3 times per day).  Work up to 25-30 minutes (1-2 times per day).   Example:   Day 1-2 3-5 minutes 3 times per day   Day 7-8 7-8 minutes 2-3 times per day   Day 13-14 10-12 minutes 1-2 times per day  B. You Can Walk For a Certain Distance Each Day     Distance can be substituted for time.    Example:   3 trips to mailbox (at road)   3 trips to corner of block   3 trips around the block

## 2016-12-06 NOTE — Therapy (Signed)
[facility] [address] [place], [place], [address] Phone: [phone]   Fax:  [phone]  Physical Therapy Treatment  Patient Details  Name: Erica Lopez MRN: [id number] Date of Birth: May 04, 1961 Referring Provider: Jaclyn Shaggy  Encounter Date: 12/06/2016      PT End of Session - 12/06/16 2217    Visit Number 3   Number of Visits 4   Date for PT Re-Evaluation 12/29/16   Authorization Type Medicaid   Authorization Time Period 6/22-8/16/18   Authorization - Visit Number 2   Authorization - Number of Visits 3   PT Start Time 0803   PT Stop Time 0845   PT Time Calculation (min) 42 min   Equipment Utilized During Treatment Gait belt   Activity Tolerance Patient tolerated treatment well   Behavior During Therapy [facility] for tasks assessed/performed      Past Medical History:  Diagnosis Date  . Hypertension   . Stroke [facility]) 2014    No past surgical history on file.  Vitals:   12/06/16 0805  BP: (!) 146/98        Subjective Assessment - 12/06/16 0806    Subjective No pain, nothing new this morning.  Reports she is doing exercises.  Daughter reports having had her BP medications today.   Patient is accompained by: Family member  daughter   Pertinent History History of CVA 2014, HTN   Patient Stated Goals Get back to cooking, walking better.   Currently in Pain? No/denies                         [facility] Adult PT Treatment/Exercise - 12/06/16 0828      Ambulation/Gait   Ambulation/Gait Yes   Ambulation/Gait Assistance 5: Supervision   Ambulation Distance (Feet) 356 Feet  then 115   Assistive device None   Gait Pattern Step-through pattern;Decreased arm swing - right;Decreased arm swing - left;Decreased step length - right;Decreased step length - left;Decreased dorsiflexion - right;Decreased dorsiflexion - left;Right foot flat;Left foot flat;Decreased trunk rotation;Narrow base of support    Ambulation Surface Level;Indoor   Pre-Gait Activities Discussed and provided recommendations on walking program for home.   Gait Comments 356 ft in 3 minute walk     Knee/Hip Exercises: Seated   Long Arc Quad AROM;Left;Right;10 reps   Marching Limitations Seated marching x 10 reps   Sit to [employer] --             Balance Exercises - 12/06/16 0815      Balance Exercises: Standing   Tandem Stance Eyes open;Upper extremity support 2;10 secs;3 reps   Sit to Stand Time x 5 reps, cues for forward lean and upright posture to stand for functional lower extremity strengthening   Other Standing Exercises Reviewed HEP provided at 11/13/16 visit.  Pt needs min cues for initiation and for technique, but she reports doing them at home.  Minisquats at counter x 10 reps           PT Education - 12/06/16 2216    Education provided Yes   Education Details HEP additions-see instructions   Person(s) Educated Patient   Methods Explanation;Demonstration;Handout   Comprehension Verbalized understanding;Returned demonstration;Verbal cues required;Need further instruction             PT Long Term Goals - 10/30/16 1129      PT LONG TERM GOAL #1   Title Pt will perform HEP with family supervision to address balance,  gait and strength.  TARGET 12/29/16   Baseline no formal HEP at this time; decreased lower extremity strength, decreased balance per DGI, TUG   Time 8  Requested 3 visits over 8 weeks   Period Weeks   Status New     PT LONG TERM GOAL #2   Title Pt will improved gait velocity to at least 2 ft/sec for improved gait efficiency/decreased fall risk.   Baseline Gait velocity 1.82 ft/sec (indicates increased fall risk; 1.31-2.62 ft/sec indicate limited community ambulator)   Time 8   Period Weeks   Status New     PT LONG TERM GOAL #3   Title Pt will improve TUG score to less than or equal to 25 seconds for decreased fall risk.   Baseline TUG score 33.78 sec (>30 sec indicates  difficulty with ADLs in home; >13.5 sec indicates increased fall risk)   Time 8   Period Weeks   Status New     PT LONG TERM GOAL #4   Title Pt will improve DGI to at least 15/24 for decreased fall risk.   Baseline DGI score is 11/24 (<19/24 indicates increased fall risk.)   Time 8   Period Weeks   Status New     PT LONG TERM GOAL #5   Title Pt/daughter will verbalize understanding of fall prevention in home environment.     Time 8   Status New               Plan - 12/06/16 2217    Clinical Impression Statement Skilled PT session focused on reviewing and updating HEP to address balance, strengthening and gait training.  With gait activities, pt noted to relax guarded arms and improve posture after 150-200 ft of gait.  Pt will continue to benefiit from skilled PT towards LTGs.   Rehab Potential Fair   Clinical Impairments Affecting Rehab Potential history of cognitive impairments at baseline; daugther present for eval   PT Frequency 1x / week   PT Duration Other (comment)  for 3 visits over the course of 8 weeks   PT Treatment/Interventions ADLs/Self Care Home Management;Functional mobility training;Stair training;Gait training;DME Instruction;Patient/family education;Neuromuscular re-education;Balance training;Therapeutic exercise;Therapeutic activities   PT Next Visit Plan Review and progress HEP as able; check LTGs and plan for d/c.   Consulted and Agree with Plan of Care Patient;Family member/caregiver   Family Member Consulted daughter      Patient will benefit from skilled therapeutic intervention in order to improve the following deficits and impairments:  Abnormal gait, Decreased balance, Decreased mobility, Decreased strength, Decreased safety awareness, Difficulty walking, Postural dysfunction  Visit Diagnosis: Unsteadiness on feet  Muscle weakness (generalized)  Other abnormalities of gait and mobility     Problem List Patient Active Problem List    Diagnosis Date Noted  . Protein calorie malnutrition (HCC) 08/10/2016  . History of stroke 07/13/2016  . Tinea pedis 07/13/2016  . Underweight 07/13/2016  . Hypertension 07/13/2016    Cordelle Dahmen W. 12/06/2016, 10:24 PM  Gean MaidensMARRIOTT,Zeynab Klett W., PT   [facility] [address] [place], [place], [address] Phone: [phone]   Fax:  [phone]  Name: Theotis BurrowMiriam Lopez MRN: [id number] Date of Birth: 09/02/1960

## 2016-12-11 ENCOUNTER — Ambulatory Visit: Payer: Medicaid Other | Admitting: Physical Therapy

## 2016-12-11 VITALS — BP 135/91 | HR 69

## 2016-12-11 DIAGNOSIS — R2689 Other abnormalities of gait and mobility: Secondary | ICD-10-CM | POA: Diagnosis not present

## 2016-12-11 DIAGNOSIS — R2681 Unsteadiness on feet: Secondary | ICD-10-CM

## 2016-12-11 NOTE — Patient Instructions (Addendum)

## 2016-12-11 NOTE — Therapy (Signed)
[facility] [address] [place], [place], [address] Phone: [phone]   Fax:  [phone]  Physical Therapy Treatment  Patient Details  Name: Erica Lopez MRN: [id number] Date of Birth: 1961-01-10 Referring Provider: Arnoldo Morale  Encounter Date: 12/11/2016      PT End of Session - 12/11/16 1209    Visit Number 4   Number of Visits 4   Date for PT Re-Evaluation 12/29/16   Authorization Type Medicaid   Authorization Time Period 6/22-8/16/18   Authorization - Visit Number 3   Authorization - Number of Visits 3   PT Start Time 0845   PT Stop Time 0925   PT Time Calculation (min) 40 min   Activity Tolerance Patient tolerated treatment well   Behavior During Therapy [facility] for tasks assessed/performed      Past Medical History:  Diagnosis Date  . Hypertension   . Stroke [facility]) 2014    No past surgical history on file.  Vitals:   12/11/16 0849  BP: (!) 135/91  Pulse: 69        Subjective Assessment - 12/11/16 0847    Subjective Daughter reports the nurse aide will help patient make sure she's doing exercises.   Patient is accompained by: Family member  daughter   Pertinent History History of CVA 2014, HTN   Patient Stated Goals Get back to cooking, walking better.   Currently in Pain? No/denies                         [facility] Adult PT Treatment/Exercise - 12/11/16 0001      Ambulation/Gait   Ambulation/Gait Yes   Ambulation/Gait Assistance 5: Supervision   Ambulation Distance (Feet) 350 Feet   Assistive device None   Gait Pattern Step-through pattern;Decreased arm swing - right;Decreased arm swing - left;Decreased step length - right;Decreased step length - left;Decreased dorsiflexion - right;Decreased dorsiflexion - left;Right foot flat;Left foot flat;Decreased trunk rotation;Narrow base of support   Ambulation Surface Level;Indoor   Gait velocity 22.84 sec ( 1.44 ft/sec)   Pre-Gait  Activities Reviewed walking program as HEP, but pt not able to verbalize that she is walking more than bedroom>bathroom distances in apartment.   Gait Comments 323 ft in 3 minutes     Standardized Balance Assessment   Standardized Balance Assessment Timed Up and Go Test;Dynamic Gait Index     Dynamic Gait Index   Level Surface Moderate Impairment   Change in Gait Speed Moderate Impairment   Gait with Horizontal Head Turns Moderate Impairment   Gait with Vertical Head Turns Moderate Impairment   Gait and Pivot Turn Moderate Impairment   Step Over Obstacle Moderate Impairment   Step Around Obstacles Mild Impairment   Steps Mild Impairment   Total Score 10     Timed Up and Go Test   TUG Normal TUG   Normal TUG (seconds) 23.63     Self-Care   Self-Care Other Self-Care Comments   Other Self-Care Comments  Fall prevention education provided-see instructions.  Discussed progress towards goals, plans for disharge today.  Recommended to pt/daughter that nurse aide help pt consistently perform HEpa nd walking program, if daughter unable.     Knee/Hip Exercises: Seated   Long Arc Quad AROM;Left;Right;10 reps   Long [employer] Limitations REview of HEP given last visit-pt return demonstration with cues for 3 second hold.    Marching Limitations Seated marching x 10 reps  REview of HEP given  last visit   Sit to Sand 10 reps;with UE support;without UE support  Review of HEP                PT Education - 12/11/16 1209    Education provided Yes   Education Details Fall prevention education; review of HEP   Person(s) Educated Patient;Child(ren)   Methods Explanation;Handout   Comprehension Verbalized understanding             PT Long Term Goals - 12/11/16 0856      PT LONG TERM GOAL #1   Title Pt will perform HEP with family supervision to address balance, gait and strength.  TARGET 12/29/16   Baseline --   Time 8  Requested 3 visits over 8 weeks   Period Weeks   Status  Achieved     PT LONG TERM GOAL #2   Title Pt will improved gait velocity to at least 2 ft/sec for improved gait efficiency/decreased fall risk.   Baseline 1.4 ft/sec at discharge   Time 8   Period Weeks   Status Not Met     PT LONG TERM GOAL #3   Title Pt will improve TUG score to less than or equal to 25 seconds for decreased fall risk.   Baseline 23.63 sec at discharge   Time 8   Period Weeks   Status Achieved     PT LONG TERM GOAL #4   Title Pt will improve DGI to at least 15/24 for decreased fall risk.   Baseline 10/24 at discharge   Time 8   Period Weeks   Status Not Met     PT LONG TERM GOAL #5   Title Pt/daughter will verbalize understanding of fall prevention in home environment.     Time 8   Status Achieved               Plan - 12/11/16 1211    Clinical Impression Statement Assessed LTGs today, with pt meeting LTG 1, 3, and 5.  LTG 2 and 4 not met, with gait velocity score slowed from eval and DGI decreased by 1 point from eval.  Pt continues to ambulate with slowed, hesitant, guarded gait pattern, and PT discussed fall prevention with patient/family.  Have addressed balance, strength, transfers and gait for HEP, but unsure of consistent carryover at home.  Pt discharging this visit.   Rehab Potential Fair   Clinical Impairments Affecting Rehab Potential history of cognitive impairments at baseline; daugther present for eval   PT Frequency 1x / week   PT Duration Other (comment)  for 3 visits over the course of 8 weeks   PT Treatment/Interventions ADLs/Self Care Home Management;Functional mobility training;Stair training;Gait training;DME Instruction;Patient/family education;Neuromuscular re-education;Balance training;Therapeutic exercise;Therapeutic activities   PT Next Visit Plan Discharge this visit.   Consulted and Agree with Plan of Care Patient;Family member/caregiver   Family Member Consulted daughter      Patient will benefit from skilled  therapeutic intervention in order to improve the following deficits and impairments:  Abnormal gait, Decreased balance, Decreased mobility, Decreased strength, Decreased safety awareness, Difficulty walking, Postural dysfunction  Visit Diagnosis: Other abnormalities of gait and mobility  Unsteadiness on feet     Problem List Patient Active Problem List   Diagnosis Date Noted  . Protein calorie malnutrition ([place]) 08/10/2016  . History of stroke 07/13/2016  . Tinea pedis 07/13/2016  . Underweight 07/13/2016  . Hypertension 07/13/2016    Lissa Rowles W. 12/11/2016, 12:15 PM  Frazier Butt., PT  [facility] [address] [place], [place], [address] Phone: [phone]   Fax:  [phone]  Name: Erica Lopez MRN: [id number]  Date of Birth: 1960-12-27   PHYSICAL THERAPY DISCHARGE SUMMARY  Visits from Start of Care: 4 (3 PT visits plus eval)  Current functional level related to goals / functional outcomes: See LTGs above-pt has met 3 of 5 LTGs   Remaining deficits: Gait and balance   Education / Equipment: Pt/daughter educated in [place], walking program and fall prevention education.  Pt/daughter verbalize understanding.  Plan: Patient agrees to discharge.  Patient goals were partially met. Patient is being discharged due to                                                     ?????maximizing rehab progress at this time/completing authorized Medicaid visits.         Frazier Butt., PT

## 2016-12-25 ENCOUNTER — Telehealth: Payer: Self-pay | Admitting: Family Medicine

## 2016-12-25 DIAGNOSIS — I10 Essential (primary) hypertension: Secondary | ICD-10-CM

## 2016-12-25 MED ORDER — LISINOPRIL 40 MG PO TABS: 40.0000 mg | ORAL_TABLET | Freq: Every day | ORAL | 5 refills | Status: DC

## 2016-12-25 NOTE — Telephone Encounter (Signed)
Patient called requesting medication refill on lisinopril (PRINIVIL,ZESTRIL) 40 MG tablet, pt was informed of refills but states pharmacy states they have no refills, please f/up

## 2016-12-25 NOTE — Telephone Encounter (Signed)
Lisinopril refilled.

## 2017-01-03 ENCOUNTER — Ambulatory Visit: Payer: Medicaid Other | Admitting: Podiatry

## 2017-01-05 ENCOUNTER — Emergency Department (HOSPITAL_COMMUNITY): Payer: Medicaid Other

## 2017-01-05 ENCOUNTER — Encounter (HOSPITAL_COMMUNITY): Payer: Self-pay | Admitting: Emergency Medicine

## 2017-01-05 ENCOUNTER — Emergency Department (HOSPITAL_COMMUNITY)
Admission: EM | Admit: 2017-01-05 | Discharge: 2017-01-06 | Disposition: A | Discharge status: Home / Self Care | Payer: Medicaid Other | Attending: Emergency Medicine | Admitting: Emergency Medicine

## 2017-01-05 DIAGNOSIS — R55 Syncope and collapse: Secondary | ICD-10-CM | POA: Insufficient documentation

## 2017-01-05 DIAGNOSIS — N390 Urinary tract infection, site not specified: Secondary | ICD-10-CM

## 2017-01-05 DIAGNOSIS — Z8673 Personal history of transient ischemic attack (TIA), and cerebral infarction without residual deficits: Secondary | ICD-10-CM | POA: Insufficient documentation

## 2017-01-05 DIAGNOSIS — I10 Essential (primary) hypertension: Secondary | ICD-10-CM | POA: Insufficient documentation

## 2017-01-05 LAB — CBC WITH DIFFERENTIAL/PLATELET
Basophils Absolute: 0 10*3/uL (ref 0.0–0.1)
Basophils Relative: 1 %
EOS PCT: 0 %
Eosinophils Absolute: 0 10*3/uL (ref 0.0–0.7)
HEMATOCRIT: 37 % (ref 36.0–46.0)
HEMOGLOBIN: 11.8 g/dL — AB (ref 12.0–15.0)
LYMPHS ABS: 0.9 10*3/uL (ref 0.7–4.0)
Lymphocytes Relative: 21 %
MCH: 22.3 pg — ABNORMAL LOW (ref 26.0–34.0)
MCHC: 31.9 g/dL (ref 30.0–36.0)
MCV: 69.9 fL — AB (ref 78.0–100.0)
Monocytes Absolute: 0.2 10*3/uL (ref 0.1–1.0)
Monocytes Relative: 5 %
Neutro Abs: 3.4 10*3/uL (ref 1.7–7.7)
Neutrophils Relative %: 73 %
Platelets: 168 10*3/uL (ref 150–400)
RBC: 5.29 MIL/uL — AB (ref 3.87–5.11)
RDW: 16.9 % — ABNORMAL HIGH (ref 11.5–15.5)
WBC: 4.5 10*3/uL (ref 4.0–10.5)

## 2017-01-05 LAB — BASIC METABOLIC PANEL
Anion gap: 13 (ref 5–15)
BUN: 14 mg/dL (ref 6–20)
CALCIUM: 9.4 mg/dL (ref 8.9–10.3)
CO2: 22 mmol/L (ref 22–32)
CREATININE: 1.32 mg/dL — AB (ref 0.44–1.00)
Chloride: 103 mmol/L (ref 101–111)
GFR, EST AFRICAN AMERICAN: 51 mL/min — AB (ref >60)
GFR, EST NON AFRICAN AMERICAN: 44 mL/min — AB (ref >60)
GLUCOSE: 87 mg/dL (ref 65–99)
Potassium: 4.2 mmol/L (ref 3.5–5.1)
Sodium: 138 mmol/L (ref 135–145)

## 2017-01-05 LAB — URINALYSIS, ROUTINE W REFLEX MICROSCOPIC
BILIRUBIN URINE: NEGATIVE
GLUCOSE, UA: NEGATIVE mg/dL
Hgb urine dipstick: NEGATIVE
KETONES UR: 5 mg/dL — AB
NITRITE: NEGATIVE
Protein, ur: 100 mg/dL — AB
SPECIFIC GRAVITY, URINE: 1.019 (ref 1.005–1.030)
pH: 5 (ref 5.0–8.0)

## 2017-01-05 LAB — I-STAT TROPONIN, ED: Troponin i, poc: 0 ng/mL (ref 0.00–0.08)

## 2017-01-05 MED ORDER — SODIUM CHLORIDE 0.9 % IV BOLUS (SEPSIS)
1000.0000 mL | Freq: Once | INTRAVENOUS | Status: AC
  Administered 2017-01-05: 1000 mL via INTRAVENOUS
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
## 2017-01-05 NOTE — ED Triage Notes (Signed)
Pt here from  Home with c/o syncopal episode , pos loc , pt has a small abrasion to the right shoulder  cbg 109 , pt alert and oriented on arrival

## 2017-01-05 NOTE — Discharge Instructions (Signed)
Your work up was reassuring today. Please return to ED with any recurrence of symptoms. Follow up with PCM as instructed.

## 2017-01-05 NOTE — ED Notes (Signed)
Patient transported to CT 

## 2017-01-05 NOTE — ED Notes (Signed)
Patient transported to X-ray 

## 2017-01-06 NOTE — ED Provider Notes (Signed)
Pt presented to the ED after a syncopal episode.  ED workup suggests UTI.  Otherwise reassuring.  I saw and evaluated the patient, reviewed the resident's note and I agree with the findings and plan.   EKG Interpretation  Date/Time:  Friday January 05 2017 17:57:23 EDT Ventricular Rate:  83 PR Interval:    QRS Duration: 83 QT Interval:  379 QTC Calculation: 446 R Axis:   50 Text Interpretation:  Sinus rhythm Borderline low voltage, extremity leads Anteroseptal infarct, old No significant change since last tracing Confirmed by Linwood Dibbles [phone]) on 01/05/2017 6:03:56 PM         Linwood Dibbles, MD 01/06/17 [phone]

## 2017-02-07 ENCOUNTER — Ambulatory Visit (INDEPENDENT_AMBULATORY_CARE_PROVIDER_SITE_OTHER): Payer: Medicaid Other | Admitting: Podiatry

## 2017-02-07 ENCOUNTER — Encounter: Payer: Self-pay | Admitting: Podiatry

## 2017-02-07 DIAGNOSIS — M79674 Pain in right toe(s): Secondary | ICD-10-CM

## 2017-02-07 DIAGNOSIS — M79675 Pain in left toe(s): Secondary | ICD-10-CM

## 2017-02-07 DIAGNOSIS — B351 Tinea unguium: Secondary | ICD-10-CM | POA: Diagnosis not present

## 2017-02-07 NOTE — Progress Notes (Signed)
Complaint:  Visit Type: Patient returns to my office for continued preventative foot care services. Complaint: Patient states" my nails have grown long and thick and become painful to walk and wear shoes"  The patient presents for preventative foot care services for her big toes.  Patient has history of CVA.Marland Kitchen No changes to ROS  Podiatric Exam: Vascular: dorsalis pedis and posterior tibial pulses are palpable bilateral. Capillary return is immediate. Temperature gradient is WNL. Skin turgor WNL  Sensorium: Normal Semmes Weinstein monofilament test. Normal tactile sensation bilaterally. Nail Exam: Pt has thick disfigured discolored nails with subungual debris noted bilateral entire nail hallux  toenails Ulcer Exam: There is no evidence of ulcer or pre-ulcerative changes or infection. Orthopedic Exam: Muscle tone and strength are WNL. No limitations in general ROM. No crepitus or effusions noted. Foot type and digits show no abnormalities. Bony prominences are unremarkable. Skin: No Porokeratosis. No infection or ulcers  Diagnosis:  Onychomycosis, , Pain in right toe, pain in left toes  Treatment & Plan Procedures and Treatment: Consent by patient was obtained for treatment procedures.   Debridement of mycotic and hypertrophic toenails, hallux   bilateral and clearing of subungual debris. No ulceration, no infection noted.  Return Visit-Office Procedure: Patient instructed to return to the office for a follow up visit 4 months for continued evaluation and treatment.    Helane Gunther DPM

## 2017-02-13 ENCOUNTER — Ambulatory Visit: Payer: Medicaid Other | Admitting: Family Medicine

## 2017-02-27 ENCOUNTER — Ambulatory Visit: Payer: Medicaid Other | Admitting: Family Medicine

## 2017-04-26 ENCOUNTER — Encounter (HOSPITAL_COMMUNITY): Payer: Self-pay

## 2017-04-26 ENCOUNTER — Emergency Department (HOSPITAL_COMMUNITY)
Admission: EM | Admit: 2017-04-26 | Discharge: 2017-04-27 | Disposition: A | Discharge status: Home / Self Care | Payer: Medicaid Other | Attending: Emergency Medicine | Admitting: Emergency Medicine

## 2017-04-26 ENCOUNTER — Other Ambulatory Visit: Payer: Self-pay

## 2017-04-26 DIAGNOSIS — Z79899 Other long term (current) drug therapy: Secondary | ICD-10-CM | POA: Diagnosis not present

## 2017-04-26 DIAGNOSIS — R220 Localized swelling, mass and lump, head: Secondary | ICD-10-CM | POA: Diagnosis present

## 2017-04-26 DIAGNOSIS — Z8673 Personal history of transient ischemic attack (TIA), and cerebral infarction without residual deficits: Secondary | ICD-10-CM | POA: Insufficient documentation

## 2017-04-26 DIAGNOSIS — T783XXA Angioneurotic edema, initial encounter: Secondary | ICD-10-CM | POA: Insufficient documentation

## 2017-04-26 DIAGNOSIS — I10 Essential (primary) hypertension: Secondary | ICD-10-CM | POA: Diagnosis not present

## 2017-04-26 LAB — I-STAT CHEM 8, ED
BUN: 16 mg/dL (ref 6–20)
CALCIUM ION: 1.13 mmol/L — AB (ref 1.15–1.40)
CHLORIDE: 107 mmol/L (ref 101–111)
CREATININE: 1 mg/dL (ref 0.44–1.00)
GLUCOSE: 96 mg/dL (ref 65–99)
HCT: 48 % — ABNORMAL HIGH (ref 36.0–46.0)
Hemoglobin: 16.3 g/dL — ABNORMAL HIGH (ref 12.0–15.0)
POTASSIUM: 4 mmol/L (ref 3.5–5.1)
Sodium: 144 mmol/L (ref 135–145)
TCO2: 26 mmol/L (ref 22–32)

## 2017-04-26 MED ORDER — FAMOTIDINE IN NACL 20-0.9 MG/50ML-% IV SOLN: 20.0000 mg | Freq: Once | INTRAVENOUS | Status: DC

## 2017-04-26 MED ORDER — SODIUM CHLORIDE 0.9 % IV SOLN: INTRAVENOUS | Status: DC

## 2017-04-26 MED ORDER — DIPHENHYDRAMINE HCL 50 MG/ML IJ SOLN: 25.0000 mg | Freq: Once | INTRAMUSCULAR | Status: DC

## 2017-04-26 MED ORDER — HYDROCHLOROTHIAZIDE 25 MG PO TABS: 25.0000 mg | ORAL_TABLET | Freq: Every day | ORAL | 0 refills | Status: DC

## 2017-04-26 MED ORDER — PREDNISONE 50 MG PO TABS: 50.0000 mg | ORAL_TABLET | Freq: Every day | ORAL | 0 refills | Status: DC

## 2017-04-26 MED ORDER — DIPHENHYDRAMINE HCL 25 MG PO TABS: 25.0000 mg | ORAL_TABLET | Freq: Four times a day (QID) | ORAL | 0 refills | Status: DC

## 2017-04-26 MED ORDER — METHYLPREDNISOLONE SODIUM SUCC 125 MG IJ SOLR: 125.0000 mg | Freq: Once | INTRAMUSCULAR | Status: DC

## 2017-04-26 MED ORDER — AMLODIPINE BESYLATE 5 MG PO TABS: 10.0000 mg | ORAL_TABLET | Freq: Every day | ORAL | 5 refills | Status: DC

## 2017-04-26 NOTE — ED Notes (Signed)
Pt st's her lips started swelling earlier today.  No resp distress present at this time.

## 2017-04-26 NOTE — ED Provider Notes (Signed)
[facility] Provider Note   CSN: [id number] Arrival date & time: 04/26/17  2103     History   Chief Complaint Chief Complaint  Patient presents with  . Oral Swelling    HPI Erica Lopez is a 56 y.o. female.  HPI Presents to the emergency room for evaluation of upper lip swelling.  Patient does not recall eating anything that would cause her to have an allergic reaction.  She does not recall any new medications.  Patient does take an ACE inhibitor for hypertension.  Patient noticed her lips were swelling earlier today.  That has persisted throughout the day.  She denies any difficulty with her breathing.  No difficulty with her swallowing. Past Medical History:  Diagnosis Date  . Hypertension   . Stroke [facility](HCC) 2014    Patient Active Problem List   Diagnosis Date Noted  . Protein calorie malnutrition (HCC) 08/10/2016  . History of stroke 07/13/2016  . Tinea pedis 07/13/2016  . Underweight 07/13/2016  . Hypertension 07/13/2016    History reviewed. No pertinent surgical history.  OB History    No data available       Home Medications    Prior to Admission medications   Medication Sig Start Date End Date Taking? Authorizing Provider  amLODipine (NORVASC) 5 MG tablet Take 2 tablets (10 mg total) by mouth daily. 04/26/17   Linwood DibblesKnapp, Lashawndra Lampkins, MD  diphenhydrAMINE (BENADRYL) 25 MG tablet Take 1 tablet (25 mg total) by mouth every 6 (six) hours. 04/26/17   Linwood DibblesKnapp, Anthonia Monger, MD  hydrochlorothiazide (HYDRODIURIL) 25 MG tablet Take 1 tablet (25 mg total) by mouth daily. 04/26/17   Linwood DibblesKnapp, Brandell Maready, MD  lisinopril (PRINIVIL,ZESTRIL) 40 MG tablet Take 1 tablet (40 mg total) by mouth daily. 12/25/16   Jaclyn ShaggyAmao, Enobong, MD  mirtazapine (REMERON) 30 MG tablet Take 1 tablet (30 mg total) by mouth at bedtime. 10/05/16   Jaclyn ShaggyAmao, Enobong, MD  predniSONE (DELTASONE) 50 MG tablet Take 1 tablet (50 mg total) by mouth daily. 04/26/17   Linwood DibblesKnapp, Cortlin Marano, MD    Family History History  reviewed. No pertinent family history.  Social History Social History   Tobacco Use  . Smoking status: Never Smoker  . Smokeless tobacco: Never Used  Substance Use Topics  . Alcohol use: No  . Drug use: No     Allergies   Aspirin and Other   Review of Systems Review of Systems  All other systems reviewed and are negative.    Physical Exam Updated Vital Signs BP (!) 158/95   Pulse 73   Temp 98.2 F (36.8 C) (Oral)   Resp 18   Ht 1.549 m (5\' 1" )   Wt 42.6 kg (94 lb)   SpO2 100%   BMI 17.76 kg/m   Physical Exam  Constitutional: No distress.  HENT:  Head: Normocephalic and atraumatic.  Right Ear: External ear normal.  Left Ear: External ear normal.  Mouth/Throat: Oropharynx is clear and moist and mucous membranes are normal. No uvula swelling. No oropharyngeal exudate.  Edema of her upper and lower lips  Eyes: Conjunctivae are normal. Right eye exhibits no discharge. Left eye exhibits no discharge. No scleral icterus.  Neck: Neck supple. No tracheal deviation present.  Cardiovascular: Normal rate, regular rhythm and intact distal pulses.  Pulmonary/Chest: Effort normal and breath sounds normal. No stridor. No respiratory distress. She has no wheezes. She has no rales.  Abdominal: Soft. Bowel sounds are normal. She exhibits no distension. There is no tenderness.  There is no rebound and no guarding.  Musculoskeletal: She exhibits no edema or tenderness.  Neurological: She is alert. She has normal strength. No cranial nerve deficit (no facial droop, extraocular movements intact, no slurred speech) or sensory deficit. She exhibits normal muscle tone. She displays no seizure activity. Coordination normal.  Skin: Skin is warm and dry. No rash noted. She is not diaphoretic.  Psychiatric: She has a normal mood and affect.  Nursing note and vitals reviewed.    ED Treatments / Results  Labs (all labs ordered are listed, but only abnormal results are displayed) Labs  Reviewed  I-STAT CHEM 8, ED - Abnormal; Notable for the following components:      Result Value   Calcium, Ion 1.13 (*)    Hemoglobin 16.3 (*)    HCT 48.0 (*)    All other components within normal limits     Procedures Procedures (including critical care time)  Medications Ordered in ED Medications  diphenhydrAMINE (BENADRYL) injection 25 mg (not administered)  methylPREDNISolone sodium succinate (SOLU-MEDROL) 125 mg/2 mL injection 125 mg (not administered)  famotidine (PEPCID) IVPB 20 mg premix (not administered)  0.9 %  sodium chloride infusion (not administered)     Initial Impression / Assessment and Plan / ED Course  I have reviewed the triage vital signs and the nursing notes.  Pertinent labs & imaging results that were available during my care of the patient were reviewed by me and considered in my medical decision making (see chart for details).  Clinical Course as of Apr 27 2347  Thu Apr 26, 2017  2347 Patient was monitored in the emergency room.  No worsening of her reaction.  She feels like her symptoms are improving   [JK]    Clinical Course User Index [JK] Linwood DibblesKnapp, Woodie Trusty, MD  Patient presented to the emergency room for evaluation of angioedema.  I suspect this is related to her lisinopril use.  Patient was given steroids and antihistamines in case this was an allergic type reaction.  She is actually showing some improvement.  Plan on discharge home.  I will have her stop the lisinopril, increase her Norvasc and add hydrochlorothiazide.  Final Clinical Impressions(s) / ED Diagnoses   Final diagnoses:  Angioedema, initial encounter    ED Discharge Orders        Ordered    amLODipine (NORVASC) 10 MG tablet  Daily     04/26/17 2345    hydrochlorothiazide (HYDRODIURIL) 25 MG tablet  Daily     04/26/17 2345    predniSONE (DELTASONE) 50 MG tablet  Daily     04/26/17 2345    diphenhydrAMINE (BENADRYL) 25 MG tablet  Every 6 hours     04/26/17 2345       Linwood DibblesKnapp,  Curlie Sittner, MD 04/26/17 2348

## 2017-04-26 NOTE — Discharge Instructions (Signed)
Increase your Norvasc to 10 mg a day.  Start taking hydrochlorothiazide.  Stop the lisinopril since that may have caused the angioedema reaction.  Follow-up with your primary doctor next week

## 2017-04-26 NOTE — ED Triage Notes (Signed)
Per EMS, pt from home with complaint of upper lip swelling. Pt has allergies to aspirin, blueberry and fleas but has had no contact with either. Pt takes ace inhibitor. VSS. Airway intact, breath sounds clear.

## 2017-04-27 NOTE — ED Notes (Signed)
Patient  Refused all care from prior primary RN; This RN discharged patient with Rx

## 2017-06-12 ENCOUNTER — Ambulatory Visit: Payer: Medicaid Other | Attending: Family Medicine | Admitting: Family Medicine

## 2017-06-12 ENCOUNTER — Ambulatory Visit: Payer: Medicaid Other | Attending: Family Medicine | Admitting: Licensed Clinical Social Worker

## 2017-06-12 ENCOUNTER — Ambulatory Visit: Payer: Medicaid Other | Admitting: Podiatry

## 2017-06-12 ENCOUNTER — Telehealth: Payer: Self-pay | Admitting: Family Medicine

## 2017-06-12 ENCOUNTER — Encounter: Payer: Self-pay | Admitting: Family Medicine

## 2017-06-12 VITALS — BP 168/102 | HR 67 | Temp 98.2°F | Ht 63.0 in | Wt 92.0 lb

## 2017-06-12 DIAGNOSIS — Z79899 Other long term (current) drug therapy: Secondary | ICD-10-CM | POA: Insufficient documentation

## 2017-06-12 DIAGNOSIS — R636 Underweight: Secondary | ICD-10-CM | POA: Diagnosis not present

## 2017-06-12 DIAGNOSIS — I10 Essential (primary) hypertension: Secondary | ICD-10-CM | POA: Diagnosis not present

## 2017-06-12 DIAGNOSIS — Z0189 Encounter for other specified special examinations: Secondary | ICD-10-CM | POA: Insufficient documentation

## 2017-06-12 DIAGNOSIS — Z8673 Personal history of transient ischemic attack (TIA), and cerebral infarction without residual deficits: Secondary | ICD-10-CM | POA: Diagnosis not present

## 2017-06-12 DIAGNOSIS — E43 Unspecified severe protein-calorie malnutrition: Secondary | ICD-10-CM | POA: Diagnosis not present

## 2017-06-12 DIAGNOSIS — F320 Major depressive disorder, single episode, mild: Secondary | ICD-10-CM

## 2017-06-12 MED ORDER — AMLODIPINE BESYLATE 10 MG PO TABS: 10.0000 mg | ORAL_TABLET | Freq: Every day | ORAL | 6 refills | Status: DC

## 2017-06-12 MED ORDER — MIRTAZAPINE 30 MG PO TABS: 30.0000 mg | ORAL_TABLET | Freq: Every day | ORAL | 6 refills | Status: DC

## 2017-06-12 NOTE — Patient Instructions (Signed)
 High-Protein and High-Calorie Diet Eating high-protein and high-calorie foods can help you to gain weight, heal after an injury, and recover after an illness or surgery. What is my plan? The specific amount of daily protein and calories you need depends on:  Your body weight.  The reason this diet is recommended for you.  Generally, a high-protein, high-calorie diet involves:  Eating 250-500 extra calories each day.  Making sure that 10-35% of your daily calories come from protein.  Talk to your health care provider about how much protein and how many calories you need each day. Follow the diet as directed by your health care provider. What do I need to know about this diet?  Ask your health care provider if you should take a nutritional supplement.  Try to eat six small meals each day instead of three large meals.  Eat a balanced diet, including one food that is high in protein at each meal.  Keep nutritious snacks handy, such as nuts, trail mixes, dried fruit, and yogurt.  If you have kidney disease or diabetes, eating too much protein may put extra stress on your kidneys. Talk to your health care provider if you have either of those conditions. What are some high-protein foods? Grains Quinoa. Bulgur wheat. Vegetables Soybeans. Peas. Meats and Other Protein Sources Beef, pork, and poultry. Fish and seafood. Eggs. Tofu. Textured vegetable protein (TVP). Peanut butter. Nuts and seeds. Dried beans. Protein powders. Dairy Whole milk. Whole-milk yogurt. Powdered milk. Cheese. Cottage Cheese. Eggnog. Beverages High-protein supplement drinks. Soy milk. Other Protein bars. The items listed above may not be a complete list of recommended foods or beverages. Contact your dietitian for more options. What are some high-calorie foods? Grains Pasta. Quick breads. Muffins. Pancakes. Ready-to-eat cereal. Vegetables Vegetables cooked in oil or butter. Fried potatoes. Fruits Dried  fruit. Fruit leather. Canned fruit in syrup. Fruit juice. Avocados. Meats and Other Protein Sources Peanut butter. Nuts and seeds. Dairy Heavy cream. Whipped cream. Cream cheese. Sour cream. Ice cream. Custard. Pudding. Beverages Meal-replacement beverages. Nutrition shakes. Fruit juice. Sugar-sweetened soft drinks. Condiments Salad dressing. Mayonnaise. Alfredo sauce. Fruit preserves or jelly. Honey. Syrup. Sweets/Desserts Cake. Cookies. Pie. Pastries. Candy bars. Chocolate. Fats and Oils Butter or margarine. Oil. Gravy. Other Meal-replacement bars. The items listed above may not be a complete list of recommended foods or beverages. Contact your dietitian for more options. What are some tips for including high-protein and high-calorie foods in my diet?  Add whole milk, half-and-half, or heavy cream to cereal, pudding, soup, or hot cocoa.  Add whole milk to instant breakfast drinks.  Add peanut butter to oatmeal or smoothies.  Add powdered milk to baked goods, smoothies, or milkshakes.  Add powdered milk, cream, or butter to mashed potatoes.  Add cheese to cooked vegetables.  Make whole-milk yogurt parfaits. Top them with granola, fruit, or nuts.  Add cottage cheese to your fruit.  Add avocados, cheese, or both to sandwiches or salads.  Add meat, poultry, or seafood to rice, pasta, casseroles, salads, and soups.  Use mayonnaise when making egg salad, chicken salad, or tuna salad.  Use peanut butter as a topping for pretzels, celery, or crackers.  Add beans to casseroles, dips, and spreads.  Add pureed beans to sauces and soups.  Replace calorie-free drinks with calorie-containing drinks, such as milk and fruit juice. This information is not intended to replace advice given to you by your health care provider. Make sure you discuss any questions you have with your   health care provider. Document Released: 05/08/2005 Document Revised: 10/14/2015 Document Reviewed:  10/21/2013 Elsevier Interactive Patient Education  2018 Elsevier Inc.  

## 2017-06-12 NOTE — Telephone Encounter (Signed)
Briefly met with patient and patient's daughter during office visit with provider. Expressed to daughter that provider agreed  to increase PCS services hours for mother. Informed patient's daughter that we would fill out documents and fax it as soon as possible.   I explained to patient's daughter the services available to patient such as SCAT, medicaid transportation, PACE program and senior centers. Patient's daughter expressed that for now they are not interested in SCAT or Medicaid transportation because she can bring her mother to her appointments. Daughter mentioned that she works two jobs and therefore she tries to schedule mother's appointments on days or times she's not working. Regarding the PACE program and senior centers, daughter expressed that she'll think about it and reach out to [place], if they are interested.   No further questions or concerns.

## 2017-06-12 NOTE — Progress Notes (Signed)
Subjective:  Patient ID: Erica Lopez, female    DOB: [birth year]  Age: 57 y.o. 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  She had an ED visit last month for angioedema thought to be secondary to lisinopril after she had presented with lip edema at which time lisinopril was discontinued and she was placed on amlodipine. She states she has been taking 5 mg of amlodipine and her blood pressure is elevated today.  Her weight continues to be an issue as the patient's daughter states prior to the stroke the patient weighed about 200 pounds but then lost all the weight (she now weighs 92 lbs) when she had the stroke and ever since has been unable to gain it back.  The patient has a good appetite but she informs me today that "she has been eating too much". The patient's daughter also requested a mental health evaluation as she has heard her mom speak on the phone with her sister indicating she is too big and would like to go on a diet so she does not gain weight and this is all "in her mind".  She currently receives [facility] services but the daughter would like this increased as she receives 2 hours on 4 days a week and the patient needs more help with grooming, preparing meals and engage in other social activities as all the patient does is sit all day. During the encounter she answers questions in monosyllables.  Past Medical History:  Diagnosis Date  . Hypertension   . Stroke [facility]) 2014    History reviewed. No pertinent surgical history.  Allergies  Allergen Reactions  . Aspirin Anaphylaxis and Other (See Comments)    Bleeding event  . Lisinopril Swelling  . Other Hives, Swelling and Other (See Comments)    Blueberries, causing hives  and swelling     Outpatient Medications Prior to Visit  Medication Sig Dispense Refill  . amLODipine (NORVASC) 5 MG tablet Take 2 tablets (10 mg total) by mouth daily. 30 tablet 5  . diphenhydrAMINE (BENADRYL) 25 MG tablet Take 1 tablet (25 mg total) by mouth every 6 (six) hours. 20 tablet 0  . hydrochlorothiazide (HYDRODIURIL) 25 MG tablet Take 1 tablet (25 mg total) by mouth daily. (Patient not taking: Reported on 06/12/2017) 30 tablet 0  . lisinopril (PRINIVIL,ZESTRIL) 40 MG tablet Take 1 tablet (40 mg total) by mouth daily. (Patient not taking: Reported on 06/12/2017) 30 tablet 5  . mirtazapine (REMERON) 30 MG tablet Take 1 tablet (30 mg total) by mouth at bedtime. (Patient not taking: Reported on 06/12/2017) 30 tablet 3  . predniSONE (DELTASONE) 50 MG tablet Take 1 tablet (50 mg total) by mouth daily. (Patient not taking: Reported on 06/12/2017) 5 tablet 0   No facility-administered medications prior to visit.     ROS Review of Systems  Constitutional: Negative for activity change, appetite change and fatigue.  HENT: Negative for congestion, sinus pressure and sore throat.   Eyes: Negative for visual disturbance.  Respiratory: Negative for cough, chest tightness, shortness of breath and wheezing.   Cardiovascular: Negative for chest pain and palpitations.  Gastrointestinal: Negative for abdominal distention, abdominal pain and constipation.  Endocrine: Negative for polydipsia.  Genitourinary: Negative for dysuria and frequency.  Musculoskeletal: Negative for arthralgias  and back pain.  Skin: Negative for rash.  Neurological: Negative for tremors, light-headedness and numbness.  Hematological: Does not bruise/bleed easily.  Psychiatric/Behavioral: Negative for agitation and behavioral problems.    Objective:  BP (!) 168/102   Pulse 67   Temp 98.2 F (36.8 C) (Oral)   Ht 5\' 3"  (1.6 m)   Wt 92 lb (41.7 kg)   SpO2 100%   BMI 16.30 kg/m   BP/Weight 06/12/2017 04/26/2017  01/05/2017  Systolic BP 168 152 133  Diastolic BP 102 86 83  Wt. (Lbs) 92 94 -  BMI 16.3 17.76 -      Physical Exam  Constitutional: She is oriented to person, place, and time. She appears well-developed and well-nourished.  Cardiovascular: Normal rate, normal heart sounds and intact distal pulses.  No murmur heard. Pulmonary/Chest: Effort normal and breath sounds normal. She has no wheezes. She has no rales. She exhibits no tenderness.  Abdominal: Soft. Bowel sounds are normal. She exhibits no distension and no mass. There is no tenderness.  Musculoskeletal: Normal range of motion.  Neurological: She is alert and oriented to person, place, and time.  Skin: Skin is warm and dry.  Psychiatric: She has a normal mood and affect.     Assessment & Plan:   1. Essential hypertension Uncontrolled Increase dose of amlodipine Counseled on blood pressure goal of less than 130/80, low-sodium, DASH diet, medication compliance, 150 minutes of moderate intensity exercise per week. Discussed medication compliance, adverse effects. - amLODipine (NORVASC) 10 MG tablet; Take 1 tablet (10 mg total) by mouth daily.  Dispense: 30 tablet; Refill: 6  2. Underweight  Refilled Remeron which will likely help with weight gain - TSH  3. Severe protein-calorie malnutrition (HCC) Discussed the need to increase caloric intake LCSW called in for counseling and referral to outside psychotherapy facilities to assist with increase intake orally.   Meds ordered this encounter  Medications  . amLODipine (NORVASC) 10 MG tablet    Sig: Take 1 tablet (10 mg total) by mouth daily.    Dispense:  30 tablet    Refill:  6    Discontinue Lisinopril  . mirtazapine (REMERON) 30 MG tablet    Sig: Take 1 tablet (30 mg total) by mouth at bedtime.    Dispense:  30 tablet    Refill:  6    Discontinue previous dose    Follow-up: Return in about 3 months (around 09/10/2017) for Follow-up of chronic medical conditions.     Jaclyn ShaggyEnobong Amao MD

## 2017-06-13 LAB — TSH: TSH: 1.21 u[IU]/mL (ref 0.450–4.500)

## 2017-06-13 NOTE — BH Specialist Note (Signed)
Integrated Behavioral Health Initial Visit  MRN: [id number] Name: Erica Lopez Eddleman  Number of Integrated Behavioral Health Clinician visits:: 1/6 Session Start time: 10:05 AM  Session End time: 10:40 AM Total time: 35 minutes  Type of Service: Integrated Behavioral Health- Individual/Family Interpretor:No. Interpretor Name and Language: NA   Warm Hand Off Completed.       SUBJECTIVE: Erica Lopez Fayson is a 57 y.o. female accompanied by adult daughter and minor granddaughter Patient was referred by Dr. Rufina FalcoNewolin for community resources. Patient reports the following symptoms/concerns: Pt reports that she sleeps for long periods of time to keep from overeating. She exhibits withdrawn behavior by watching television in room Duration of problem: Couple of weeks; Severity of problem: moderate  OBJECTIVE: Mood: Appropriate and Affect: Constricted Risk of harm to self or others: No plan to harm self or others  LIFE CONTEXT: Family and Social: Pt resides with her adult daughter, minor granddaughter, and daughter's best friend.  School/Work: Pt receives disability and food stamps Self-Care: No report of substance use.  Life Changes: Pt has ongoing medical concerns and exhibits symptoms of depression.   GOALS ADDRESSED: Patient will: 1. Reduce symptoms of: depression 2. Increase knowledge and/or ability of: coping skills and healthy habits  3. Demonstrate ability to: Increase adequate support systems for patient/family  INTERVENTIONS: Interventions utilized: Mining engineerBehavioral Activation, Supportive Counseling, Psychoeducation and/or Health Education and Link to [facility] Resources  Standardized Assessments completed: GAD-7 and PHQ 2&9  ASSESSMENT: Patient currently experiencing symptoms of depression. Pt was accompanied at visit by her adult daughter and minor grandchild. Pt reported that she sleeps for long periods of time to keep from overeating. She exhibits withdrawn behavior by watching television  in room. Pt receives support from family.   Patient may benefit from psychoeducation, psychotherapy, and medication management. LCSWA educated pt on the correlation between one's physical and mental health. LCSWA discussed positive activities for behavioral activation with family to increase positive feelings. Pt participating in medication management through PCP. LCSWA provided information for psychotherapy and provided community resources to strengthen pt's support system.   PLAN: 1. Follow up with behavioral health clinician on : Pt was encouraged to contact LCSWA if symptoms worsen or fail to improve to [facility] 2. Behavioral recommendations: LCSWA recommends that pt apply healthy coping skills discussed, comply with medication management, and utilize provided resources. Pt is encouraged to schedule follow up appointment with LCSWA 3. Referral(s): [facility] and Counselor 4. "From scale of 1-10, how likely are you to follow plan?":   Bridgett LarssonJasmine D Kaysee Hergert, LCSW 06/14/17 4:55 PM

## 2017-06-14 ENCOUNTER — Telehealth: Payer: Self-pay | Admitting: Family Medicine

## 2017-06-14 ENCOUNTER — Telehealth: Payer: Self-pay

## 2017-06-14 NOTE — Telephone Encounter (Signed)
Patient's PCS application [facility] [phone].

## 2017-06-15 NOTE — Telephone Encounter (Signed)
Pt was called and informed of lab results. 

## 2017-06-18 ENCOUNTER — Telehealth: Payer: Self-pay

## 2017-06-18 NOTE — Telephone Encounter (Signed)
Call [facility] to [facility]. Spoke to ButterfieldJermaine who confirmed receipt of the change of status request. He noted that they are ready to schedule the patient for a re-assessment.

## 2017-09-10 ENCOUNTER — Ambulatory Visit: Payer: Medicaid Other | Admitting: Family Medicine

## 2017-09-18 ENCOUNTER — Ambulatory Visit: Payer: Medicaid Other | Admitting: Family Medicine

## 2018-04-05 ENCOUNTER — Telehealth: Payer: Self-pay | Admitting: Family Medicine

## 2018-04-05 DIAGNOSIS — I10 Essential (primary) hypertension: Secondary | ICD-10-CM

## 2018-04-05 MED ORDER — AMLODIPINE BESYLATE 10 MG PO TABS: 10.0000 mg | ORAL_TABLET | Freq: Every day | ORAL | 0 refills | Status: DC

## 2018-04-05 NOTE — Telephone Encounter (Signed)
1) Medication(s) Requested (by name): Amlodipine   2) Pharmacy of Choice: [facility]   3) Special Requests:   Approved medications will be sent to the pharmacy, we will reach out if there is an issue.  Requests made after 3pm may not be addressed until the following business day!  If a patient is unsure of the name of the medication(s) please note and ask patient to call back when they are able to provide all info, do not send to responsible party until all information is available!

## 2018-04-23 ENCOUNTER — Encounter: Payer: Self-pay | Admitting: Family Medicine

## 2018-04-23 ENCOUNTER — Ambulatory Visit: Payer: Medicaid Other | Attending: Family Medicine | Admitting: Family Medicine

## 2018-04-23 VITALS — BP 155/102 | HR 87 | Temp 97.7°F | Ht 63.0 in | Wt 94.0 lb

## 2018-04-23 DIAGNOSIS — Z118 Encounter for screening for other infectious and parasitic diseases: Secondary | ICD-10-CM | POA: Diagnosis not present

## 2018-04-23 DIAGNOSIS — I10 Essential (primary) hypertension: Secondary | ICD-10-CM | POA: Diagnosis not present

## 2018-04-23 DIAGNOSIS — E43 Unspecified severe protein-calorie malnutrition: Secondary | ICD-10-CM | POA: Diagnosis not present

## 2018-04-23 DIAGNOSIS — F0150 Vascular dementia without behavioral disturbance: Secondary | ICD-10-CM

## 2018-04-23 DIAGNOSIS — Z1159 Encounter for screening for other viral diseases: Secondary | ICD-10-CM

## 2018-04-23 DIAGNOSIS — Z888 Allergy status to other drugs, medicaments and biological substances status: Secondary | ICD-10-CM | POA: Insufficient documentation

## 2018-04-23 DIAGNOSIS — Z8673 Personal history of transient ischemic attack (TIA), and cerebral infarction without residual deficits: Secondary | ICD-10-CM | POA: Diagnosis not present

## 2018-04-23 MED ORDER — MIRTAZAPINE 30 MG PO TABS: 30.0000 mg | ORAL_TABLET | Freq: Every day | ORAL | 6 refills | Status: DC

## 2018-04-23 MED ORDER — AMLODIPINE BESYLATE 10 MG PO TABS: 10.0000 mg | ORAL_TABLET | Freq: Every day | ORAL | 6 refills | Status: DC

## 2018-04-23 NOTE — Progress Notes (Signed)
Subjective:  Patient ID: Erica Lopez, female    DOB: 1960-09-22  Age: 57 y.o. 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 Her blood pressure is elevated and she is yet to take her antihypertensives. Daughter is still concerned of her failure to gain weight as she weighs 94 pounds today and weighed 92 pounds 10 months ago at her last visit despite eating well and drinking Ensure.  TSH from last visit was normal. She has no additional concerns today except that she feels cold.  Past Medical History:  Diagnosis Date  . Hypertension   . Stroke [facility]) 2014    History reviewed. No pertinent surgical history.  Allergies  Allergen Reactions  . Aspirin Anaphylaxis and Other (See Comments)    Bleeding event  . Lisinopril Swelling  . Other Hives, Swelling and Other (See Comments)    Blueberries, causing hives and swelling     Outpatient Medications Prior to Visit  Medication Sig Dispense Refill  . amLODipine (NORVASC) 10 MG tablet Take 1 tablet (10 mg total) by mouth daily. 30 tablet 0  . mirtazapine (REMERON) 30 MG tablet Take 1 tablet (30 mg total) by mouth at bedtime. (Patient not taking: Reported on 04/23/2018) 30 tablet 6   No facility-administered medications prior to visit.     ROS Review of Systems  Constitutional: Negative for activity change, appetite change and fatigue.  HENT: Negative for congestion, sinus pressure and sore throat.   Eyes: Negative for visual disturbance.  Respiratory: Negative for cough, chest tightness, shortness of breath and wheezing.   Cardiovascular: Negative for chest pain and palpitations.  Gastrointestinal: Negative for abdominal distention, abdominal pain and constipation.  Endocrine:  Negative for polydipsia.  Genitourinary: Negative for dysuria and frequency.  Musculoskeletal: Negative for arthralgias and back pain.  Skin: Negative for rash.  Neurological: Negative for tremors, light-headedness and numbness.  Hematological: Does not bruise/bleed easily.  Psychiatric/Behavioral: Negative for agitation and behavioral problems.    Objective:  BP (!) 155/102   Pulse 87   Temp 97.7 F (36.5 C) (Oral)   Ht 5\' 3"  (1.6 m)   Wt 94 lb (42.6 kg)   SpO2 97%   BMI 16.65 kg/m   BP/Weight 04/23/2018 06/12/2017 04/26/2017  Systolic BP 155 168 152  Diastolic BP 102 102 86  Wt. (Lbs) 94 92 94  BMI 16.65 16.3 17.76      Physical Exam  Constitutional: She is oriented to person, place, and time. She appears well-developed and well-nourished.  Thin  Cardiovascular: Normal rate, normal heart sounds and intact distal pulses.  No murmur heard. Pulmonary/Chest: Effort normal and breath sounds normal. She has no wheezes. She has no rales. She exhibits no tenderness.  Abdominal: Soft. Bowel sounds are normal. She exhibits no distension and no mass. There is no tenderness.  Musculoskeletal: Normal range of motion.  Neurological: She is alert and oriented to person, place, and time.    CMP Latest Ref Rng & Units 04/26/2017 01/05/2017 09/24/2016  Glucose 65 - 99 mg/dL 96 87 76  BUN 6 - 20 mg/dL 16 14 13   Creatinine 0.44 - 1.00 mg/dL 4.09 8.11(B) 1.47  Sodium 135 - 145 mmol/L 144 138 141  Potassium 3.5 - 5.1 mmol/L 4.0 4.2 3.9  Chloride 101 -  111 mmol/L 107 103 107  CO2 22 - 32 mmol/L - 22 26  Calcium 8.9 - 10.3 mg/dL - 9.4 9.0  Total Protein 6.5 - 8.1 g/dL - - 7.5  Total Bilirubin 0.3 - 1.2 mg/dL - - 0.6  Alkaline Phos 38 - 126 U/L - - 57  AST 15 - 41 U/L - - 18  ALT 14 - 54 U/L - - 18    Lab Results  Component Value Date   TSH 1.210 06/12/2017    Assessment & Plan:   1. Essential hypertension Uncontrolled due to noncompliance Compliance has been  emphasized Low-sodium, DASH diet - amLODipine (NORVASC) 10 MG tablet; Take 1 tablet (10 mg total) by mouth daily.  Dispense: 30 tablet; Refill: 6 - Basic Metabolic Panel  2. Severe protein-calorie malnutrition (HCC) Previously placed on Remeron which she is not taking - Amb ref to Medical Nutrition Therapy-MNT  3. History of stroke With cognitive deficits and possible vascular dementia  4. Vascular dementia without behavioral disturbance (HCC) This does affect her decision-making and judgment  5. Screening for viral and chlamydial diseases - HIV Antibody (routine testing w rflx) - Hepatitis c antibody (reflex)   Meds ordered this encounter  Medications  . amLODipine (NORVASC) 10 MG tablet    Sig: Take 1 tablet (10 mg total) by mouth daily.    Dispense:  30 tablet    Refill:  6    Discontinue Lisinopril  . mirtazapine (REMERON) 30 MG tablet    Sig: Take 1 tablet (30 mg total) by mouth at bedtime.    Dispense:  30 tablet    Refill:  6    Follow-up: Return in about 1 month (around 05/24/2018) for Complete physical exam.   Hoy RegisterEnobong Dayden Viverette MD

## 2018-04-24 ENCOUNTER — Encounter: Payer: Self-pay | Admitting: Registered"

## 2018-04-24 ENCOUNTER — Encounter: Payer: Medicaid Other | Attending: Family Medicine | Admitting: Registered"

## 2018-04-24 DIAGNOSIS — E43 Unspecified severe protein-calorie malnutrition: Secondary | ICD-10-CM | POA: Insufficient documentation

## 2018-04-24 LAB — BASIC METABOLIC PANEL
BUN/Creatinine Ratio: 16 (ref 9–23)
BUN: 18 mg/dL (ref 6–24)
CO2: 23 mmol/L (ref 20–29)
CREATININE: 1.15 mg/dL — AB (ref 0.57–1.00)
Calcium: 10.3 mg/dL — ABNORMAL HIGH (ref 8.7–10.2)
Chloride: 105 mmol/L (ref 96–106)
GFR calc Af Amer: 61 mL/min/{1.73_m2} (ref >59)
GFR calc non Af Amer: 53 mL/min/{1.73_m2} — ABNORMAL LOW (ref >59)
GLUCOSE: 101 mg/dL — AB (ref 65–99)
Potassium: 3.8 mmol/L (ref 3.5–5.2)
SODIUM: 149 mmol/L — AB (ref 134–144)

## 2018-04-24 LAB — HIV ANTIBODY (ROUTINE TESTING W REFLEX): HIV SCREEN 4TH GENERATION: NONREACTIVE

## 2018-04-24 LAB — HCV COMMENT:

## 2018-04-24 LAB — HEPATITIS C ANTIBODY (REFLEX): HCV Ab: <0.1 s/co ratio (ref 0.0–0.9)

## 2018-04-24 NOTE — Progress Notes (Signed)
Medical Nutrition Therapy:  Appt start time: 8:20 end time:  9:10.   Assessment:  Primary concerns today: Pt arrives with daughter and granddaughter. Pt's daughter is caretaker.   Pt expectations: to see what can be done to get weight back up  Daughter states pt is not gaining weight and has not been gaining weight since being in [place] a year ago. Pt is originally from [place]. Pt's daughter states mom's weight was in the 100's when she was discharged from the hospital (June 30- Dec 20, 2012) due to brain aneurysm. Pt's daughter states prior to that pt had mini stroke (2010) and diagnosed with hypertension. Pt's daughter states she believes family stress triggered decline of mom's health. Daughter states pt weight prior to health decline ranged from 150-200 lbs and pt was more shapely during that time. Pt's daughter states pt's weight has fluctuated lately between 94-97 lbs.   Pt states she wakes up some mornings extrememly hungry and feels like she can eat everything but knows she can't because there are others in the home that need to eat. Pt states she will eat a lot at times, burp, and then feel hungry again. Pt states she typically wakes up around 12-1 pm.   Daughter states there are 4 people living in the home. Pt receives $160/month EBT benefits. Daughter states there is enough food to last from one month to the next. Daughter states a nurse aide comes in Mon-Fri 12-3 pm; will prepare meals at times and/or bring food for pt. Daughter states she works Mon-Fri 2-6 pm and will cook sometimes once she gets home from work, but unsure how much mom eats. Daughter states mom drinks Ensure but it was not provided in dietary recall.   Pt states she does not like going to unfamiliar restrooms to have bowel movements because she does not want to mess up anyones else's bathroom; prefers to be home.     Preferred Learning Style:   No preference indicated   Learning Readiness:    Contemplating  Ready  Change in progress   MEDICATIONS: See list   DIETARY INTAKE:  Usual eating pattern includes 1-2 meals and 0 snacks per day.  Everyday foods include pancakes, eggs, chicken, juice, and water.  Avoided foods include none stated.    24-hr recall:  B ( AM): none  Snk ( AM): none  L (12-1 PM): 1 pancakes + 1 egg or [employer]tacos Snk (3 PM): fried chicken  D (7 PM): none Snk ( PM): none Beverages: OJ/apple juice, water  Usual physical activity: walking around apt complex 30 min, 5 days/week  Estimated energy needs: 1800 calories 200 g carbohydrates 135 g protein 50 g fat  Progress Towards Goal(s):  In progress.   Nutritional Diagnosis:  NI-1.4 Inadequate energy intake As related to protein-calorie malnutrition.  As evidenced by dietary recall and skipping meals.    Intervention:  Nutrition education and counseling. Pt and pt's daughter were educated and counseled on the importance of pt increasing energy intake and ways to increase intake. Pt's daughter was also counseled on local caretaker opportunities. Pt and pt's daughter were in agreement with goals listed.  Goals: - Aim to seek assistance for Ensure supplement assistance. - Have Ensure Enlive once a day.  - Add butter and use whole milk when cooking food.   Teaching Method Utilized:  Visual Auditory Hands on  Handouts given during visit include:  High Calorie, High Protein Nutrition Therapy  Barriers to learning/adherence to lifestyle  change: contemplative stage of change; limited self-care abilities    Demonstrated degree of understanding via:  Teach Back   Monitoring/Evaluation:  Dietary intake, exercise, and body weight in 1 month(s).

## 2018-04-24 NOTE — Patient Instructions (Addendum)
-   Aim to seek assistance for Ensure supplement assistance.  - Have Ensure Enlive once a day.   - Add butter and use whole milk when cooking food.

## 2018-04-29 ENCOUNTER — Telehealth: Payer: Self-pay

## 2018-04-29 NOTE — Telephone Encounter (Signed)
-----   Message from Hoy RegisterEnobong Newlin, MD sent at 04/24/2018  1:25 PM EST ----- Labs are negative for HIV, hepatitis C.  Sodium is slightly elevated.  Please advised to increase fluid intake

## 2018-04-29 NOTE — Telephone Encounter (Signed)
Patient's daughter was called and given lab results for patient. 

## 2018-05-27 ENCOUNTER — Encounter: Payer: Self-pay | Admitting: Family Medicine

## 2018-05-27 ENCOUNTER — Ambulatory Visit: Payer: Medicaid Other | Attending: Family Medicine | Admitting: Family Medicine

## 2018-05-27 VITALS — BP 170/90 | HR 56 | Temp 97.6°F | Ht 63.0 in | Wt 99.0 lb

## 2018-05-27 DIAGNOSIS — Z0000 Encounter for general adult medical examination without abnormal findings: Secondary | ICD-10-CM

## 2018-05-27 DIAGNOSIS — Z1239 Encounter for other screening for malignant neoplasm of breast: Secondary | ICD-10-CM

## 2018-05-27 DIAGNOSIS — Z23 Encounter for immunization: Secondary | ICD-10-CM

## 2018-05-27 DIAGNOSIS — R32 Unspecified urinary incontinence: Secondary | ICD-10-CM | POA: Insufficient documentation

## 2018-05-27 DIAGNOSIS — Z1211 Encounter for screening for malignant neoplasm of colon: Secondary | ICD-10-CM | POA: Diagnosis not present

## 2018-05-27 NOTE — Progress Notes (Signed)
Subjective:  Patient ID: Erica Lopez, female    DOB: [birth year]  Age: 58 y.o. MRN: [id number]  CC: Annual Exam   HPI Erica Lopez presents for a complete physical exam.  She is status post hysterectomy over 20 years ago. She is due for a mammogram and colonoscopy today and is willing to receive the flu shot and Tdap.  Past Medical History:  Diagnosis Date  . Hypertension   . Stroke [facility]) 2014    History reviewed. No pertinent surgical history.  Allergies  Allergen Reactions  . Aspirin Anaphylaxis and Other (See Comments)    Bleeding event  . Lisinopril Swelling  . Other Hives, Swelling and Other (See Comments)    Blueberries, causing hives and swelling     Outpatient Medications Prior to Visit  Medication Sig Dispense Refill  . amLODipine (NORVASC) 10 MG tablet Take 1 tablet (10 mg total) by mouth daily. 30 tablet 6  . mirtazapine (REMERON) 30 MG tablet Take 1 tablet (30 mg total) by mouth at bedtime. (Patient not taking: Reported on 04/24/2018) 30 tablet 6   No facility-administered medications prior to visit.     ROS Review of Systems  Constitutional: Negative for activity change, appetite change and fatigue.  HENT: Negative for congestion, sinus pressure and sore throat.   Eyes: Negative for visual disturbance.  Respiratory: Negative for cough, chest tightness, shortness of breath and wheezing.   Cardiovascular: Negative for chest pain and palpitations.  Gastrointestinal: Negative for abdominal distention, abdominal pain and constipation.  Endocrine: Negative for polydipsia.  Genitourinary: Negative for dysuria and frequency.  Musculoskeletal: Negative for arthralgias and back pain.  Skin: Negative for rash.  Neurological: Negative for tremors, light-headedness and numbness.  Hematological: Does not bruise/bleed easily.  Psychiatric/Behavioral: Negative for agitation and behavioral problems.    Objective:  BP (!) 170/90   Pulse (!) 56   Temp 97.6 F (36.4  C) (Oral)   Ht 5\' 3"  (1.6 m)   Wt 99 lb (44.9 kg)   SpO2 100%   BMI 17.54 kg/m   BP/Weight 05/27/2018 04/24/2018 04/23/2018  Systolic BP 170 - 155  Diastolic BP 90 - 836  Wt. (Lbs) 99 95.4 94  BMI 17.54 16.9 16.65      Physical Exam Constitutional:      General: She is not in acute distress.    Appearance: She is well-developed. She is not diaphoretic.     Comments: Underweight  HENT:     Head: Normocephalic.     Right Ear: Tympanic membrane and external ear normal.     Left Ear: Tympanic membrane and external ear normal.     Nose: Nose normal.  Eyes:     Conjunctiva/sclera: Conjunctivae normal.     Pupils: Pupils are equal, round, and reactive to light.  Neck:     Musculoskeletal: Normal range of motion.     Vascular: No JVD.  Cardiovascular:     Rate and Rhythm: Normal rate and regular rhythm.     Heart sounds: Normal heart sounds. No murmur. No gallop.   Pulmonary:     Effort: Pulmonary effort is normal. No respiratory distress.     Breath sounds: Normal breath sounds. No wheezing or rales.  Chest:     Chest wall: No tenderness.     Breasts:        Right: Normal. No mass or tenderness.        Left: Normal. No mass or tenderness.  Abdominal:     General:  Bowel sounds are normal. There is no distension.     Palpations: Abdomen is soft. There is no mass.     Tenderness: There is no abdominal tenderness.  Musculoskeletal: Normal range of motion.        General: No tenderness.  Skin:    General: Skin is warm and dry.  Neurological:     Mental Status: She is alert and oriented to person, place, and time.     Deep Tendon Reflexes: Reflexes are normal and symmetric.      Assessment & Plan:   1. Annual physical exam Counseled on 150 minutes of exercise per week, healthy eating (including decreased daily intake of saturated fats, cholesterol, added sugars, sodium), STI prevention, age appropriate routine healthcare maintenance.   2. Screening for colon cancer -  Ambulatory referral to Gastroenterology  3. Screening for breast cancer - MM Digital Screening; Future  4. Need for immunization against influenza - Flu Vaccine QUAD 36+ mos IM   No orders of the defined types were placed in this encounter.   Follow-up: Return in about 3 months (around 08/26/2018) for Chronic medical conditions.   Hoy Register MD

## 2018-05-27 NOTE — Patient Instructions (Signed)

## 2018-05-28 ENCOUNTER — Ambulatory Visit: Payer: Medicaid Other | Admitting: Registered"

## 2018-05-31 ENCOUNTER — Telehealth: Payer: Self-pay

## 2018-06-04 NOTE — Telephone Encounter (Signed)
Error

## 2018-06-06 ENCOUNTER — Ambulatory Visit: Payer: Medicaid Other | Admitting: Registered"

## 2018-06-11 DIAGNOSIS — R32 Unspecified urinary incontinence: Secondary | ICD-10-CM | POA: Diagnosis not present

## 2018-07-03 DIAGNOSIS — R32 Unspecified urinary incontinence: Secondary | ICD-10-CM | POA: Diagnosis not present

## 2018-07-05 ENCOUNTER — Telehealth: Payer: Self-pay | Admitting: Family Medicine

## 2018-07-05 NOTE — Telephone Encounter (Signed)
New Message   1) Medication(s) Requested (by name): amLODipine (NORVASC) 10 MG tablet  2) Pharmacy of Choice: [facility] [address]   3) Special Requests: Pts daughter os calling, requesting a refill until her appt on 4/7 please f/u   Approved medications will be sent to the pharmacy, we will reach out if there is an issue.  Requests made after 3pm may not be addressed until the following business day!  If a patient is unsure of the name of the medication(s) please note and ask patient to call back when they are able to provide all info, do not send to responsible party until all information is available!

## 2018-07-09 NOTE — Telephone Encounter (Signed)
Pt [facility] refills at [facility], they will get it ready and notify them when it can be picked up.

## 2018-07-25 DIAGNOSIS — R32 Unspecified urinary incontinence: Secondary | ICD-10-CM | POA: Diagnosis not present

## 2018-08-21 DIAGNOSIS — R32 Unspecified urinary incontinence: Secondary | ICD-10-CM | POA: Diagnosis not present

## 2018-08-27 ENCOUNTER — Ambulatory Visit: Payer: Medicaid Other | Admitting: Family Medicine

## 2018-09-30 DIAGNOSIS — E43 Unspecified severe protein-calorie malnutrition: Secondary | ICD-10-CM | POA: Diagnosis not present

## 2018-10-01 DIAGNOSIS — E43 Unspecified severe protein-calorie malnutrition: Secondary | ICD-10-CM | POA: Diagnosis not present

## 2018-10-02 DIAGNOSIS — E43 Unspecified severe protein-calorie malnutrition: Secondary | ICD-10-CM | POA: Diagnosis not present

## 2018-10-03 DIAGNOSIS — E43 Unspecified severe protein-calorie malnutrition: Secondary | ICD-10-CM | POA: Diagnosis not present

## 2018-10-04 DIAGNOSIS — E43 Unspecified severe protein-calorie malnutrition: Secondary | ICD-10-CM | POA: Diagnosis not present

## 2018-10-07 DIAGNOSIS — E43 Unspecified severe protein-calorie malnutrition: Secondary | ICD-10-CM | POA: Diagnosis not present

## 2018-10-08 DIAGNOSIS — E43 Unspecified severe protein-calorie malnutrition: Secondary | ICD-10-CM | POA: Diagnosis not present

## 2018-10-09 DIAGNOSIS — E43 Unspecified severe protein-calorie malnutrition: Secondary | ICD-10-CM | POA: Diagnosis not present

## 2018-10-10 DIAGNOSIS — E43 Unspecified severe protein-calorie malnutrition: Secondary | ICD-10-CM | POA: Diagnosis not present

## 2018-10-11 DIAGNOSIS — E43 Unspecified severe protein-calorie malnutrition: Secondary | ICD-10-CM | POA: Diagnosis not present

## 2018-10-14 DIAGNOSIS — E43 Unspecified severe protein-calorie malnutrition: Secondary | ICD-10-CM | POA: Diagnosis not present

## 2018-10-15 DIAGNOSIS — R32 Unspecified urinary incontinence: Secondary | ICD-10-CM | POA: Diagnosis not present

## 2018-10-15 DIAGNOSIS — E43 Unspecified severe protein-calorie malnutrition: Secondary | ICD-10-CM | POA: Diagnosis not present

## 2018-10-16 DIAGNOSIS — E43 Unspecified severe protein-calorie malnutrition: Secondary | ICD-10-CM | POA: Diagnosis not present

## 2018-10-17 DIAGNOSIS — E43 Unspecified severe protein-calorie malnutrition: Secondary | ICD-10-CM | POA: Diagnosis not present

## 2018-10-18 DIAGNOSIS — E43 Unspecified severe protein-calorie malnutrition: Secondary | ICD-10-CM | POA: Diagnosis not present

## 2018-10-21 DIAGNOSIS — E43 Unspecified severe protein-calorie malnutrition: Secondary | ICD-10-CM | POA: Diagnosis not present

## 2018-10-22 DIAGNOSIS — E43 Unspecified severe protein-calorie malnutrition: Secondary | ICD-10-CM | POA: Diagnosis not present

## 2018-10-23 DIAGNOSIS — E43 Unspecified severe protein-calorie malnutrition: Secondary | ICD-10-CM | POA: Diagnosis not present

## 2018-10-24 DIAGNOSIS — E43 Unspecified severe protein-calorie malnutrition: Secondary | ICD-10-CM | POA: Diagnosis not present

## 2018-10-25 DIAGNOSIS — E43 Unspecified severe protein-calorie malnutrition: Secondary | ICD-10-CM | POA: Diagnosis not present

## 2018-10-28 DIAGNOSIS — E43 Unspecified severe protein-calorie malnutrition: Secondary | ICD-10-CM | POA: Diagnosis not present

## 2018-10-29 DIAGNOSIS — E43 Unspecified severe protein-calorie malnutrition: Secondary | ICD-10-CM | POA: Diagnosis not present

## 2018-10-30 DIAGNOSIS — E43 Unspecified severe protein-calorie malnutrition: Secondary | ICD-10-CM | POA: Diagnosis not present

## 2018-10-31 DIAGNOSIS — E43 Unspecified severe protein-calorie malnutrition: Secondary | ICD-10-CM | POA: Diagnosis not present

## 2018-11-01 DIAGNOSIS — E43 Unspecified severe protein-calorie malnutrition: Secondary | ICD-10-CM | POA: Diagnosis not present

## 2018-11-04 DIAGNOSIS — E43 Unspecified severe protein-calorie malnutrition: Secondary | ICD-10-CM | POA: Diagnosis not present

## 2018-11-05 DIAGNOSIS — E43 Unspecified severe protein-calorie malnutrition: Secondary | ICD-10-CM | POA: Diagnosis not present

## 2018-11-06 DIAGNOSIS — E43 Unspecified severe protein-calorie malnutrition: Secondary | ICD-10-CM | POA: Diagnosis not present

## 2018-11-07 DIAGNOSIS — E43 Unspecified severe protein-calorie malnutrition: Secondary | ICD-10-CM | POA: Diagnosis not present

## 2018-11-08 DIAGNOSIS — E43 Unspecified severe protein-calorie malnutrition: Secondary | ICD-10-CM | POA: Diagnosis not present

## 2018-11-11 DIAGNOSIS — E43 Unspecified severe protein-calorie malnutrition: Secondary | ICD-10-CM | POA: Diagnosis not present

## 2018-11-12 DIAGNOSIS — E43 Unspecified severe protein-calorie malnutrition: Secondary | ICD-10-CM | POA: Diagnosis not present

## 2018-11-13 DIAGNOSIS — E43 Unspecified severe protein-calorie malnutrition: Secondary | ICD-10-CM | POA: Diagnosis not present

## 2018-11-14 DIAGNOSIS — E43 Unspecified severe protein-calorie malnutrition: Secondary | ICD-10-CM | POA: Diagnosis not present

## 2018-11-15 DIAGNOSIS — E43 Unspecified severe protein-calorie malnutrition: Secondary | ICD-10-CM | POA: Diagnosis not present

## 2018-11-18 DIAGNOSIS — E43 Unspecified severe protein-calorie malnutrition: Secondary | ICD-10-CM | POA: Diagnosis not present

## 2018-11-19 DIAGNOSIS — E43 Unspecified severe protein-calorie malnutrition: Secondary | ICD-10-CM | POA: Diagnosis not present

## 2018-11-20 DIAGNOSIS — E43 Unspecified severe protein-calorie malnutrition: Secondary | ICD-10-CM | POA: Diagnosis not present

## 2018-11-21 DIAGNOSIS — E43 Unspecified severe protein-calorie malnutrition: Secondary | ICD-10-CM | POA: Diagnosis not present

## 2018-11-22 DIAGNOSIS — E43 Unspecified severe protein-calorie malnutrition: Secondary | ICD-10-CM | POA: Diagnosis not present

## 2018-11-25 DIAGNOSIS — E43 Unspecified severe protein-calorie malnutrition: Secondary | ICD-10-CM | POA: Diagnosis not present

## 2018-11-26 DIAGNOSIS — E43 Unspecified severe protein-calorie malnutrition: Secondary | ICD-10-CM | POA: Diagnosis not present

## 2018-11-27 DIAGNOSIS — E43 Unspecified severe protein-calorie malnutrition: Secondary | ICD-10-CM | POA: Diagnosis not present

## 2018-11-28 DIAGNOSIS — E43 Unspecified severe protein-calorie malnutrition: Secondary | ICD-10-CM | POA: Diagnosis not present

## 2018-11-29 DIAGNOSIS — E43 Unspecified severe protein-calorie malnutrition: Secondary | ICD-10-CM | POA: Diagnosis not present

## 2018-12-02 DIAGNOSIS — E43 Unspecified severe protein-calorie malnutrition: Secondary | ICD-10-CM | POA: Diagnosis not present

## 2018-12-03 DIAGNOSIS — E43 Unspecified severe protein-calorie malnutrition: Secondary | ICD-10-CM | POA: Diagnosis not present

## 2018-12-04 DIAGNOSIS — E43 Unspecified severe protein-calorie malnutrition: Secondary | ICD-10-CM | POA: Diagnosis not present

## 2018-12-05 DIAGNOSIS — E43 Unspecified severe protein-calorie malnutrition: Secondary | ICD-10-CM | POA: Diagnosis not present

## 2018-12-06 DIAGNOSIS — E43 Unspecified severe protein-calorie malnutrition: Secondary | ICD-10-CM | POA: Diagnosis not present

## 2018-12-09 DIAGNOSIS — E43 Unspecified severe protein-calorie malnutrition: Secondary | ICD-10-CM | POA: Diagnosis not present

## 2018-12-10 DIAGNOSIS — E43 Unspecified severe protein-calorie malnutrition: Secondary | ICD-10-CM | POA: Diagnosis not present

## 2018-12-10 IMAGING — CT CT HEAD W/O CM
4 series · 16 of 47 positions shown, 18 images · non-contrast
Comparison: Head CT 08/03/2016

CLINICAL DATA: Syncope.

EXAM:
CT HEAD WITHOUT CONTRAST
TECHNIQUE: Contiguous axial images were obtained from the base of the skull
through the vertex without intravenous contrast.

[Series 3: head without · axial · non-contrast · 0.41mm/px · z∈[-150,-34]mm · 7 of 31 slices shown, 9 images]
[im 4/31  brain]
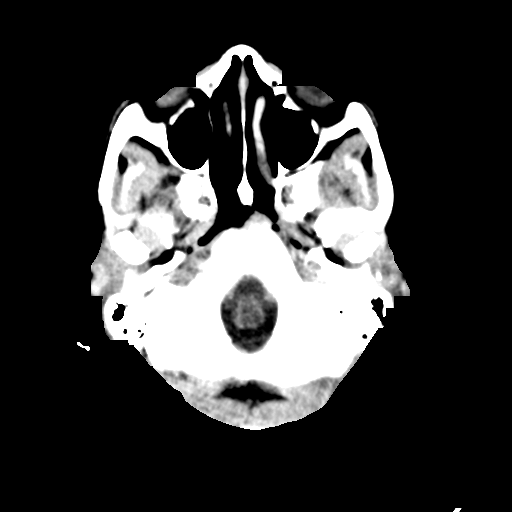
[im 4/31  bone]
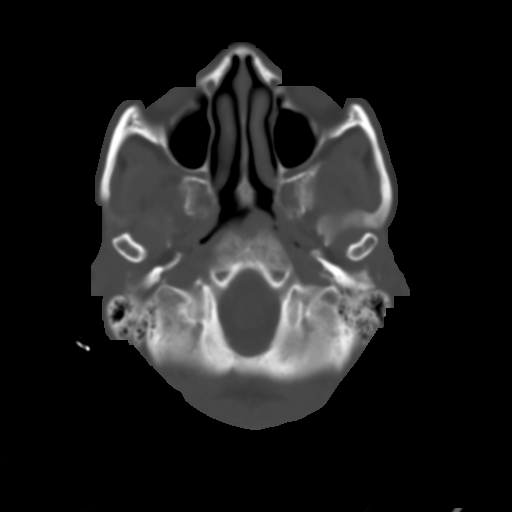
[im 8/31  brain]
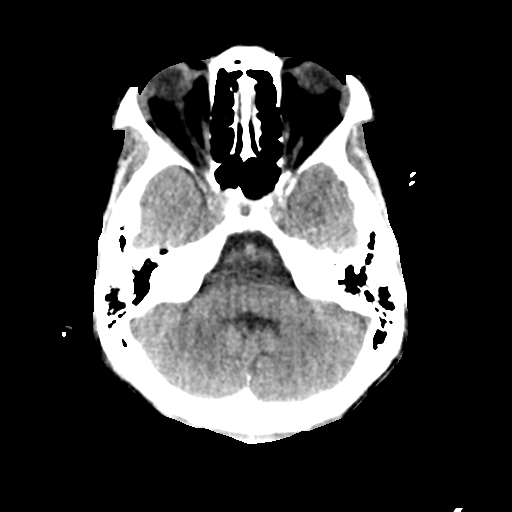
[im 12/31  brain]
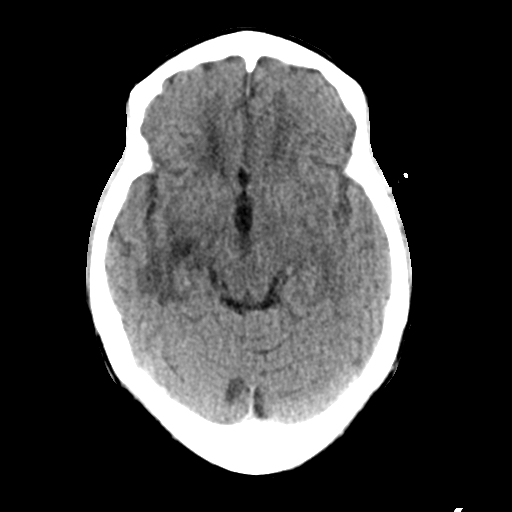
[im 16/31  brain]
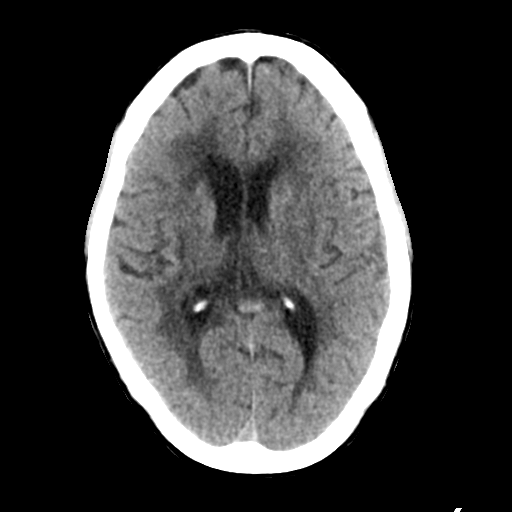
[im 19/31  brain]
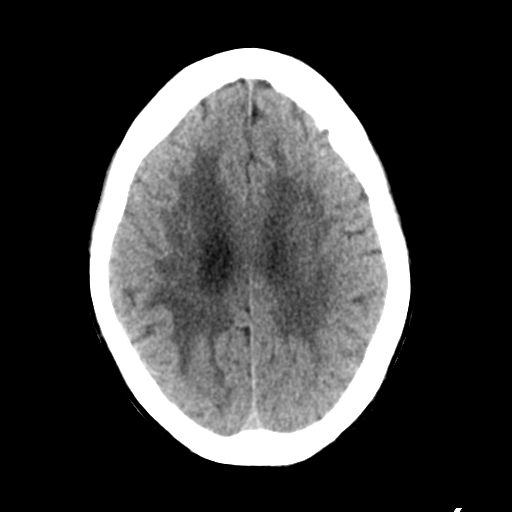
[im 19/31  bone]
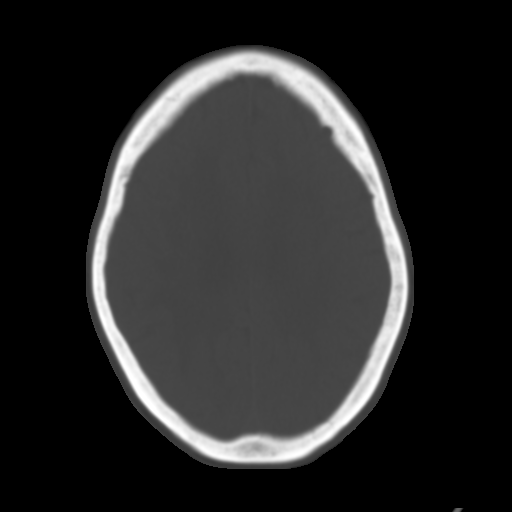
[im 23/31  brain]
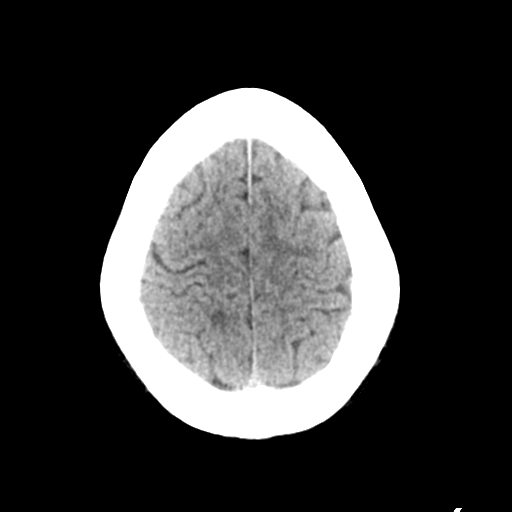
[im 27/31  brain]
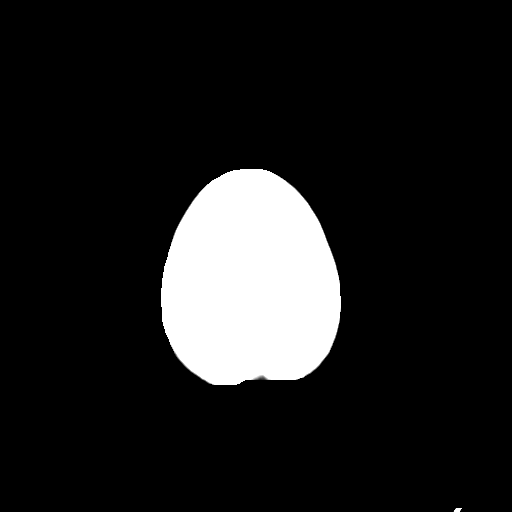

[Series 4: head bone · axial · 0.41mm/px · z∈[-150,-120]mm · 3 of 76 slices shown]
[im 8/76  bone]
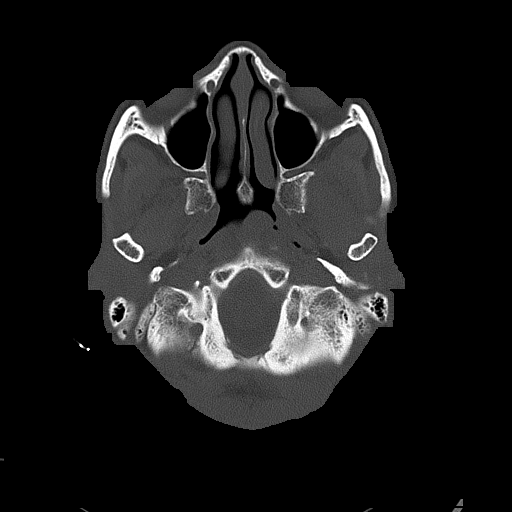
[im 16/76  bone]
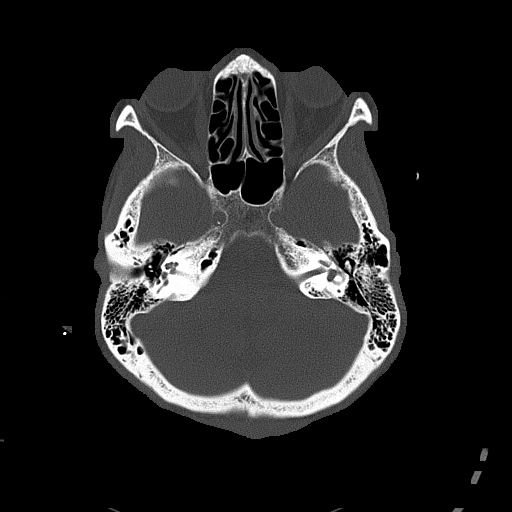
[im 23/76  bone]
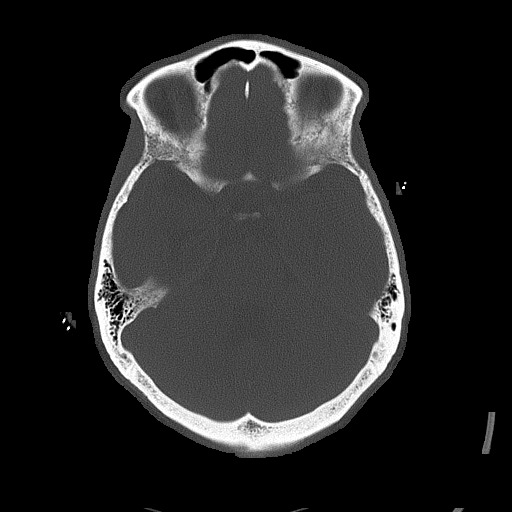

[Series 5: head without cor · coronal · non-contrast · 0.30mm/px · 3 of 67 slices shown]
[im 23/67  brain]
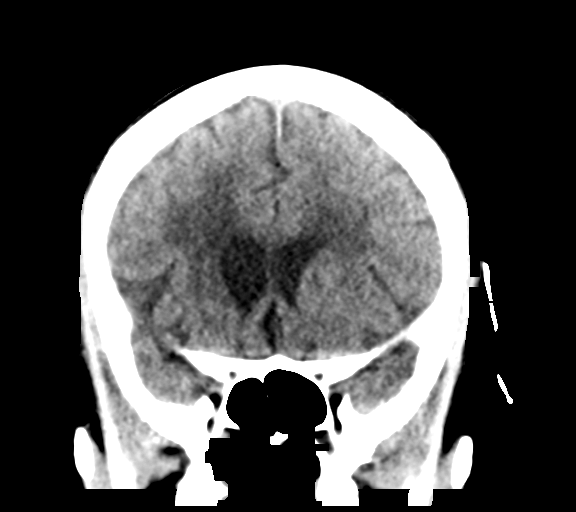
[im 30/67  brain]
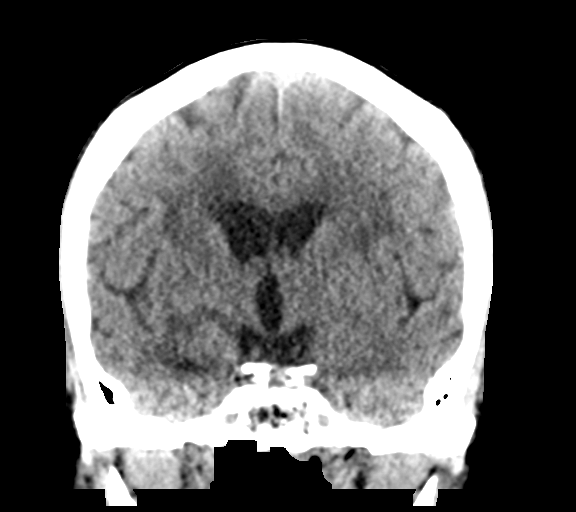
[im 37/67  brain]
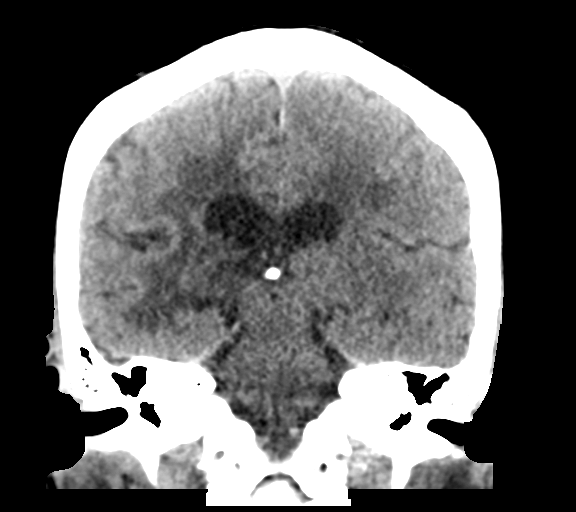

[Series 6: head without sag · sagittal · non-contrast · 0.29mm/px · 3 of 53 slices shown]
[im 18/53  brain]
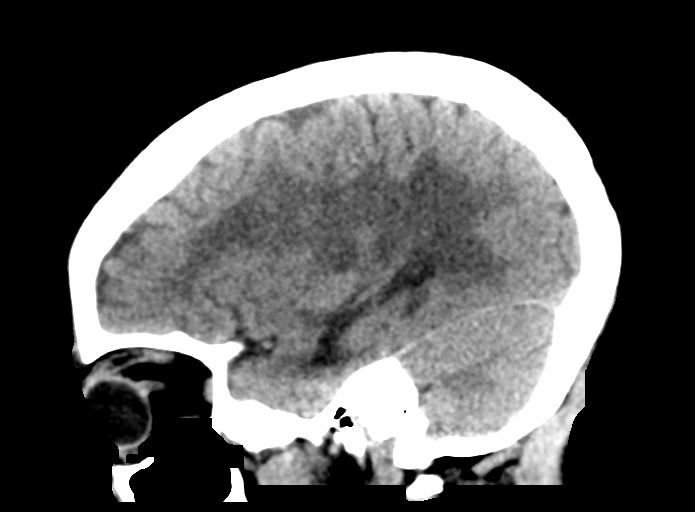
[im 27/53  brain]
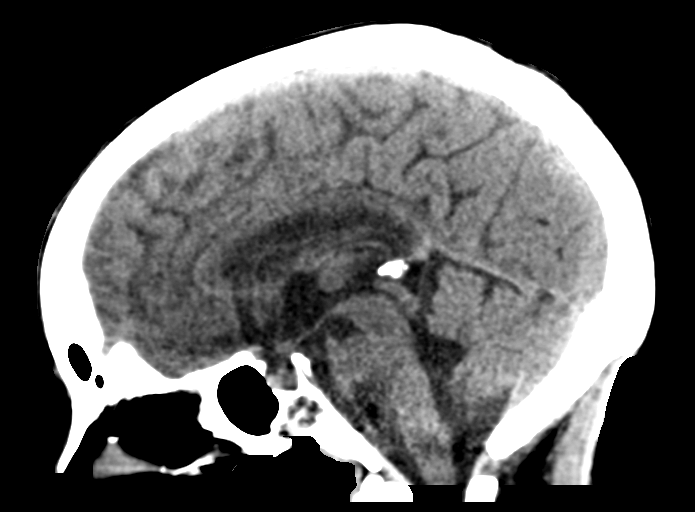
[im 35/53  brain]
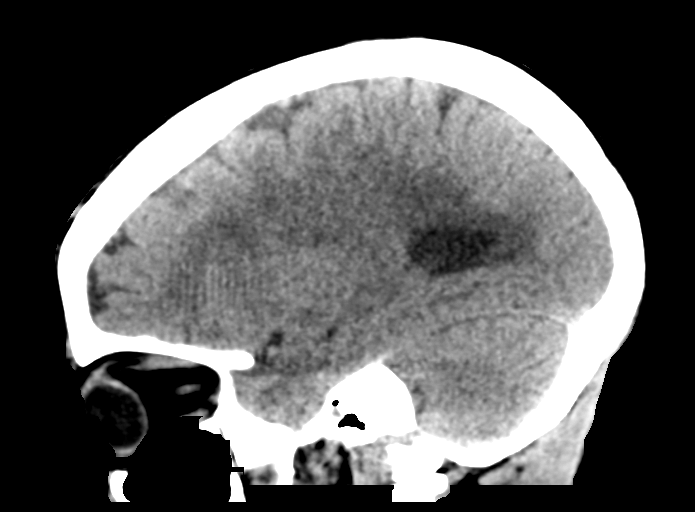

[16 of 47 positions shown; findings below may reference images not displayed]

FINDINGS: Brain: No acute hemorrhage. Advanced for age chronic small vessel
ischemia, stable from prior exam. This appears more prominent on the
right. Lacunar infarcts in the right basal ganglia with focal
encephalomalacia in the right frontal lobe. Mild ex vacuo dilatation
of the right lateral ventricle. No hydrocephalus. Basilar cisterns
are patent. No subdural or extra-axial fluid collection. No mass
effect or midline shift. No evidence of acute ischemia.

Vascular: Atherosclerosis of skullbase vasculature without
hyperdense vessel or abnormal calcification.

Skull: No skull fracture or focal lesion.

Sinuses/Orbits: Paranasal sinuses and mastoid air cells are clear.
The visualized orbits are unremarkable.

Other: None.
IMPRESSION: 1.  No acute intracranial abnormality.
2. Remote and chronic ischemia, age advanced but stable from prior
exam.

## 2018-12-11 DIAGNOSIS — E43 Unspecified severe protein-calorie malnutrition: Secondary | ICD-10-CM | POA: Diagnosis not present

## 2018-12-12 DIAGNOSIS — E43 Unspecified severe protein-calorie malnutrition: Secondary | ICD-10-CM | POA: Diagnosis not present

## 2018-12-13 DIAGNOSIS — E43 Unspecified severe protein-calorie malnutrition: Secondary | ICD-10-CM | POA: Diagnosis not present

## 2018-12-16 DIAGNOSIS — E43 Unspecified severe protein-calorie malnutrition: Secondary | ICD-10-CM | POA: Diagnosis not present

## 2018-12-17 DIAGNOSIS — E43 Unspecified severe protein-calorie malnutrition: Secondary | ICD-10-CM | POA: Diagnosis not present

## 2018-12-18 DIAGNOSIS — E43 Unspecified severe protein-calorie malnutrition: Secondary | ICD-10-CM | POA: Diagnosis not present

## 2018-12-19 DIAGNOSIS — E43 Unspecified severe protein-calorie malnutrition: Secondary | ICD-10-CM | POA: Diagnosis not present

## 2018-12-20 DIAGNOSIS — E43 Unspecified severe protein-calorie malnutrition: Secondary | ICD-10-CM | POA: Diagnosis not present

## 2018-12-23 DIAGNOSIS — E43 Unspecified severe protein-calorie malnutrition: Secondary | ICD-10-CM | POA: Diagnosis not present

## 2018-12-24 DIAGNOSIS — E43 Unspecified severe protein-calorie malnutrition: Secondary | ICD-10-CM | POA: Diagnosis not present

## 2018-12-25 DIAGNOSIS — E43 Unspecified severe protein-calorie malnutrition: Secondary | ICD-10-CM | POA: Diagnosis not present

## 2018-12-26 DIAGNOSIS — E43 Unspecified severe protein-calorie malnutrition: Secondary | ICD-10-CM | POA: Diagnosis not present

## 2018-12-27 DIAGNOSIS — E43 Unspecified severe protein-calorie malnutrition: Secondary | ICD-10-CM | POA: Diagnosis not present

## 2018-12-30 DIAGNOSIS — E43 Unspecified severe protein-calorie malnutrition: Secondary | ICD-10-CM | POA: Diagnosis not present

## 2018-12-31 DIAGNOSIS — E43 Unspecified severe protein-calorie malnutrition: Secondary | ICD-10-CM | POA: Diagnosis not present

## 2019-01-01 DIAGNOSIS — E43 Unspecified severe protein-calorie malnutrition: Secondary | ICD-10-CM | POA: Diagnosis not present

## 2019-01-02 DIAGNOSIS — E43 Unspecified severe protein-calorie malnutrition: Secondary | ICD-10-CM | POA: Diagnosis not present

## 2019-01-03 DIAGNOSIS — E43 Unspecified severe protein-calorie malnutrition: Secondary | ICD-10-CM | POA: Diagnosis not present

## 2019-01-06 DIAGNOSIS — E43 Unspecified severe protein-calorie malnutrition: Secondary | ICD-10-CM | POA: Diagnosis not present

## 2019-01-07 DIAGNOSIS — E43 Unspecified severe protein-calorie malnutrition: Secondary | ICD-10-CM | POA: Diagnosis not present

## 2019-01-08 DIAGNOSIS — E43 Unspecified severe protein-calorie malnutrition: Secondary | ICD-10-CM | POA: Diagnosis not present

## 2019-01-09 DIAGNOSIS — E43 Unspecified severe protein-calorie malnutrition: Secondary | ICD-10-CM | POA: Diagnosis not present

## 2019-01-10 DIAGNOSIS — E43 Unspecified severe protein-calorie malnutrition: Secondary | ICD-10-CM | POA: Diagnosis not present

## 2019-01-13 DIAGNOSIS — E43 Unspecified severe protein-calorie malnutrition: Secondary | ICD-10-CM | POA: Diagnosis not present

## 2019-01-14 DIAGNOSIS — E43 Unspecified severe protein-calorie malnutrition: Secondary | ICD-10-CM | POA: Diagnosis not present

## 2019-01-15 DIAGNOSIS — E43 Unspecified severe protein-calorie malnutrition: Secondary | ICD-10-CM | POA: Diagnosis not present

## 2019-01-16 DIAGNOSIS — E43 Unspecified severe protein-calorie malnutrition: Secondary | ICD-10-CM | POA: Diagnosis not present

## 2019-01-17 DIAGNOSIS — E43 Unspecified severe protein-calorie malnutrition: Secondary | ICD-10-CM | POA: Diagnosis not present

## 2019-01-20 DIAGNOSIS — E43 Unspecified severe protein-calorie malnutrition: Secondary | ICD-10-CM | POA: Diagnosis not present

## 2019-01-21 DIAGNOSIS — E43 Unspecified severe protein-calorie malnutrition: Secondary | ICD-10-CM | POA: Diagnosis not present

## 2019-01-22 DIAGNOSIS — E43 Unspecified severe protein-calorie malnutrition: Secondary | ICD-10-CM | POA: Diagnosis not present

## 2019-01-23 DIAGNOSIS — E43 Unspecified severe protein-calorie malnutrition: Secondary | ICD-10-CM | POA: Diagnosis not present

## 2019-01-24 DIAGNOSIS — E43 Unspecified severe protein-calorie malnutrition: Secondary | ICD-10-CM | POA: Diagnosis not present

## 2019-01-27 DIAGNOSIS — E43 Unspecified severe protein-calorie malnutrition: Secondary | ICD-10-CM | POA: Diagnosis not present

## 2019-01-28 DIAGNOSIS — E43 Unspecified severe protein-calorie malnutrition: Secondary | ICD-10-CM | POA: Diagnosis not present

## 2019-01-29 DIAGNOSIS — E43 Unspecified severe protein-calorie malnutrition: Secondary | ICD-10-CM | POA: Diagnosis not present

## 2019-01-30 DIAGNOSIS — E43 Unspecified severe protein-calorie malnutrition: Secondary | ICD-10-CM | POA: Diagnosis not present

## 2019-01-31 DIAGNOSIS — E43 Unspecified severe protein-calorie malnutrition: Secondary | ICD-10-CM | POA: Diagnosis not present

## 2019-02-03 DIAGNOSIS — E43 Unspecified severe protein-calorie malnutrition: Secondary | ICD-10-CM | POA: Diagnosis not present

## 2019-02-04 DIAGNOSIS — E43 Unspecified severe protein-calorie malnutrition: Secondary | ICD-10-CM | POA: Diagnosis not present

## 2019-02-05 DIAGNOSIS — E43 Unspecified severe protein-calorie malnutrition: Secondary | ICD-10-CM | POA: Diagnosis not present

## 2019-02-06 DIAGNOSIS — E43 Unspecified severe protein-calorie malnutrition: Secondary | ICD-10-CM | POA: Diagnosis not present

## 2019-02-07 DIAGNOSIS — E43 Unspecified severe protein-calorie malnutrition: Secondary | ICD-10-CM | POA: Diagnosis not present

## 2019-02-10 DIAGNOSIS — E43 Unspecified severe protein-calorie malnutrition: Secondary | ICD-10-CM | POA: Diagnosis not present

## 2019-02-11 DIAGNOSIS — E43 Unspecified severe protein-calorie malnutrition: Secondary | ICD-10-CM | POA: Diagnosis not present

## 2019-02-12 DIAGNOSIS — E43 Unspecified severe protein-calorie malnutrition: Secondary | ICD-10-CM | POA: Diagnosis not present

## 2019-02-13 DIAGNOSIS — E43 Unspecified severe protein-calorie malnutrition: Secondary | ICD-10-CM | POA: Diagnosis not present

## 2019-02-14 DIAGNOSIS — E43 Unspecified severe protein-calorie malnutrition: Secondary | ICD-10-CM | POA: Diagnosis not present

## 2019-02-17 DIAGNOSIS — E43 Unspecified severe protein-calorie malnutrition: Secondary | ICD-10-CM | POA: Diagnosis not present

## 2019-02-18 DIAGNOSIS — E43 Unspecified severe protein-calorie malnutrition: Secondary | ICD-10-CM | POA: Diagnosis not present

## 2019-02-19 DIAGNOSIS — E43 Unspecified severe protein-calorie malnutrition: Secondary | ICD-10-CM | POA: Diagnosis not present

## 2019-02-20 DIAGNOSIS — E43 Unspecified severe protein-calorie malnutrition: Secondary | ICD-10-CM | POA: Diagnosis not present

## 2019-02-21 DIAGNOSIS — E43 Unspecified severe protein-calorie malnutrition: Secondary | ICD-10-CM | POA: Diagnosis not present

## 2019-02-24 DIAGNOSIS — E43 Unspecified severe protein-calorie malnutrition: Secondary | ICD-10-CM | POA: Diagnosis not present

## 2019-02-25 DIAGNOSIS — E43 Unspecified severe protein-calorie malnutrition: Secondary | ICD-10-CM | POA: Diagnosis not present

## 2019-02-26 DIAGNOSIS — E43 Unspecified severe protein-calorie malnutrition: Secondary | ICD-10-CM | POA: Diagnosis not present

## 2019-02-27 DIAGNOSIS — E43 Unspecified severe protein-calorie malnutrition: Secondary | ICD-10-CM | POA: Diagnosis not present

## 2019-02-28 DIAGNOSIS — E43 Unspecified severe protein-calorie malnutrition: Secondary | ICD-10-CM | POA: Diagnosis not present

## 2019-03-03 DIAGNOSIS — E43 Unspecified severe protein-calorie malnutrition: Secondary | ICD-10-CM | POA: Diagnosis not present

## 2019-03-04 DIAGNOSIS — E43 Unspecified severe protein-calorie malnutrition: Secondary | ICD-10-CM | POA: Diagnosis not present

## 2019-03-05 DIAGNOSIS — E43 Unspecified severe protein-calorie malnutrition: Secondary | ICD-10-CM | POA: Diagnosis not present

## 2019-03-06 DIAGNOSIS — E43 Unspecified severe protein-calorie malnutrition: Secondary | ICD-10-CM | POA: Diagnosis not present

## 2019-03-07 DIAGNOSIS — E43 Unspecified severe protein-calorie malnutrition: Secondary | ICD-10-CM | POA: Diagnosis not present

## 2019-03-10 DIAGNOSIS — E43 Unspecified severe protein-calorie malnutrition: Secondary | ICD-10-CM | POA: Diagnosis not present

## 2019-03-11 DIAGNOSIS — E43 Unspecified severe protein-calorie malnutrition: Secondary | ICD-10-CM | POA: Diagnosis not present

## 2019-03-12 DIAGNOSIS — E43 Unspecified severe protein-calorie malnutrition: Secondary | ICD-10-CM | POA: Diagnosis not present

## 2019-03-13 DIAGNOSIS — E43 Unspecified severe protein-calorie malnutrition: Secondary | ICD-10-CM | POA: Diagnosis not present

## 2019-03-14 DIAGNOSIS — E43 Unspecified severe protein-calorie malnutrition: Secondary | ICD-10-CM | POA: Diagnosis not present

## 2019-03-17 DIAGNOSIS — E43 Unspecified severe protein-calorie malnutrition: Secondary | ICD-10-CM | POA: Diagnosis not present

## 2019-03-18 DIAGNOSIS — E43 Unspecified severe protein-calorie malnutrition: Secondary | ICD-10-CM | POA: Diagnosis not present

## 2019-03-19 DIAGNOSIS — E43 Unspecified severe protein-calorie malnutrition: Secondary | ICD-10-CM | POA: Diagnosis not present

## 2019-03-20 DIAGNOSIS — E43 Unspecified severe protein-calorie malnutrition: Secondary | ICD-10-CM | POA: Diagnosis not present

## 2019-03-21 DIAGNOSIS — E43 Unspecified severe protein-calorie malnutrition: Secondary | ICD-10-CM | POA: Diagnosis not present

## 2019-03-24 DIAGNOSIS — E43 Unspecified severe protein-calorie malnutrition: Secondary | ICD-10-CM | POA: Diagnosis not present

## 2019-03-25 DIAGNOSIS — E43 Unspecified severe protein-calorie malnutrition: Secondary | ICD-10-CM | POA: Diagnosis not present

## 2019-03-26 DIAGNOSIS — E43 Unspecified severe protein-calorie malnutrition: Secondary | ICD-10-CM | POA: Diagnosis not present

## 2019-03-27 DIAGNOSIS — E43 Unspecified severe protein-calorie malnutrition: Secondary | ICD-10-CM | POA: Diagnosis not present

## 2019-03-28 DIAGNOSIS — E43 Unspecified severe protein-calorie malnutrition: Secondary | ICD-10-CM | POA: Diagnosis not present

## 2019-03-31 DIAGNOSIS — E43 Unspecified severe protein-calorie malnutrition: Secondary | ICD-10-CM | POA: Diagnosis not present

## 2019-04-01 DIAGNOSIS — E43 Unspecified severe protein-calorie malnutrition: Secondary | ICD-10-CM | POA: Diagnosis not present

## 2019-04-02 DIAGNOSIS — E43 Unspecified severe protein-calorie malnutrition: Secondary | ICD-10-CM | POA: Diagnosis not present

## 2019-04-03 DIAGNOSIS — E43 Unspecified severe protein-calorie malnutrition: Secondary | ICD-10-CM | POA: Diagnosis not present

## 2019-04-04 DIAGNOSIS — E43 Unspecified severe protein-calorie malnutrition: Secondary | ICD-10-CM | POA: Diagnosis not present

## 2019-04-07 DIAGNOSIS — E43 Unspecified severe protein-calorie malnutrition: Secondary | ICD-10-CM | POA: Diagnosis not present

## 2019-04-08 DIAGNOSIS — E43 Unspecified severe protein-calorie malnutrition: Secondary | ICD-10-CM | POA: Diagnosis not present

## 2019-04-09 DIAGNOSIS — E43 Unspecified severe protein-calorie malnutrition: Secondary | ICD-10-CM | POA: Diagnosis not present

## 2019-04-10 DIAGNOSIS — E43 Unspecified severe protein-calorie malnutrition: Secondary | ICD-10-CM | POA: Diagnosis not present

## 2019-04-11 DIAGNOSIS — E43 Unspecified severe protein-calorie malnutrition: Secondary | ICD-10-CM | POA: Diagnosis not present

## 2019-04-14 DIAGNOSIS — E43 Unspecified severe protein-calorie malnutrition: Secondary | ICD-10-CM | POA: Diagnosis not present

## 2019-04-15 DIAGNOSIS — E43 Unspecified severe protein-calorie malnutrition: Secondary | ICD-10-CM | POA: Diagnosis not present

## 2019-04-16 DIAGNOSIS — E43 Unspecified severe protein-calorie malnutrition: Secondary | ICD-10-CM | POA: Diagnosis not present

## 2019-04-17 DIAGNOSIS — E43 Unspecified severe protein-calorie malnutrition: Secondary | ICD-10-CM | POA: Diagnosis not present

## 2019-04-18 DIAGNOSIS — E43 Unspecified severe protein-calorie malnutrition: Secondary | ICD-10-CM | POA: Diagnosis not present

## 2019-04-21 DIAGNOSIS — E43 Unspecified severe protein-calorie malnutrition: Secondary | ICD-10-CM | POA: Diagnosis not present

## 2019-04-22 DIAGNOSIS — E43 Unspecified severe protein-calorie malnutrition: Secondary | ICD-10-CM | POA: Diagnosis not present

## 2019-04-23 DIAGNOSIS — E43 Unspecified severe protein-calorie malnutrition: Secondary | ICD-10-CM | POA: Diagnosis not present

## 2019-04-24 DIAGNOSIS — E43 Unspecified severe protein-calorie malnutrition: Secondary | ICD-10-CM | POA: Diagnosis not present

## 2019-04-25 DIAGNOSIS — E43 Unspecified severe protein-calorie malnutrition: Secondary | ICD-10-CM | POA: Diagnosis not present

## 2019-04-28 DIAGNOSIS — E43 Unspecified severe protein-calorie malnutrition: Secondary | ICD-10-CM | POA: Diagnosis not present

## 2019-04-29 DIAGNOSIS — E43 Unspecified severe protein-calorie malnutrition: Secondary | ICD-10-CM | POA: Diagnosis not present

## 2019-04-30 DIAGNOSIS — E43 Unspecified severe protein-calorie malnutrition: Secondary | ICD-10-CM | POA: Diagnosis not present

## 2019-05-01 DIAGNOSIS — E43 Unspecified severe protein-calorie malnutrition: Secondary | ICD-10-CM | POA: Diagnosis not present

## 2019-05-02 DIAGNOSIS — E43 Unspecified severe protein-calorie malnutrition: Secondary | ICD-10-CM | POA: Diagnosis not present

## 2019-05-05 DIAGNOSIS — E43 Unspecified severe protein-calorie malnutrition: Secondary | ICD-10-CM | POA: Diagnosis not present

## 2019-05-06 DIAGNOSIS — E43 Unspecified severe protein-calorie malnutrition: Secondary | ICD-10-CM | POA: Diagnosis not present

## 2019-05-07 DIAGNOSIS — E43 Unspecified severe protein-calorie malnutrition: Secondary | ICD-10-CM | POA: Diagnosis not present

## 2019-05-08 DIAGNOSIS — E43 Unspecified severe protein-calorie malnutrition: Secondary | ICD-10-CM | POA: Diagnosis not present

## 2019-05-09 DIAGNOSIS — E43 Unspecified severe protein-calorie malnutrition: Secondary | ICD-10-CM | POA: Diagnosis not present

## 2019-05-12 DIAGNOSIS — E43 Unspecified severe protein-calorie malnutrition: Secondary | ICD-10-CM | POA: Diagnosis not present

## 2019-05-13 DIAGNOSIS — E43 Unspecified severe protein-calorie malnutrition: Secondary | ICD-10-CM | POA: Diagnosis not present

## 2019-05-14 DIAGNOSIS — E43 Unspecified severe protein-calorie malnutrition: Secondary | ICD-10-CM | POA: Diagnosis not present

## 2019-05-15 DIAGNOSIS — E43 Unspecified severe protein-calorie malnutrition: Secondary | ICD-10-CM | POA: Diagnosis not present

## 2019-05-16 DIAGNOSIS — E43 Unspecified severe protein-calorie malnutrition: Secondary | ICD-10-CM | POA: Diagnosis not present

## 2019-05-19 DIAGNOSIS — E43 Unspecified severe protein-calorie malnutrition: Secondary | ICD-10-CM | POA: Diagnosis not present

## 2019-05-20 DIAGNOSIS — E43 Unspecified severe protein-calorie malnutrition: Secondary | ICD-10-CM | POA: Diagnosis not present

## 2019-05-21 DIAGNOSIS — E43 Unspecified severe protein-calorie malnutrition: Secondary | ICD-10-CM | POA: Diagnosis not present

## 2019-05-22 DIAGNOSIS — E43 Unspecified severe protein-calorie malnutrition: Secondary | ICD-10-CM | POA: Diagnosis not present

## 2019-05-22 DIAGNOSIS — R32 Unspecified urinary incontinence: Secondary | ICD-10-CM | POA: Diagnosis not present

## 2019-05-23 DIAGNOSIS — E43 Unspecified severe protein-calorie malnutrition: Secondary | ICD-10-CM | POA: Diagnosis not present

## 2019-05-26 DIAGNOSIS — E43 Unspecified severe protein-calorie malnutrition: Secondary | ICD-10-CM | POA: Diagnosis not present

## 2019-05-27 DIAGNOSIS — E43 Unspecified severe protein-calorie malnutrition: Secondary | ICD-10-CM | POA: Diagnosis not present

## 2019-05-28 ENCOUNTER — Telehealth: Payer: Self-pay | Admitting: Family Medicine

## 2019-05-28 DIAGNOSIS — I10 Essential (primary) hypertension: Secondary | ICD-10-CM

## 2019-05-28 DIAGNOSIS — E43 Unspecified severe protein-calorie malnutrition: Secondary | ICD-10-CM | POA: Diagnosis not present

## 2019-05-28 MED ORDER — AMLODIPINE BESYLATE 10 MG PO TABS: 10.0000 mg | ORAL_TABLET | Freq: Every day | ORAL | 0 refills | Status: DC

## 2019-05-28 NOTE — Telephone Encounter (Signed)
1) Medication(s) Requested (by name): -amLODipine (NORVASC) 10 MG tablet   2) Pharmacy of Choice: -[id number] - [place] ([place] - [address]   3) Special Requests: -Please send enough until follow up appt on 06/09/2018 if possible

## 2019-05-28 NOTE — Telephone Encounter (Signed)
Refill sent to cover until appointment.

## 2019-05-29 DIAGNOSIS — E43 Unspecified severe protein-calorie malnutrition: Secondary | ICD-10-CM | POA: Diagnosis not present

## 2019-05-30 DIAGNOSIS — E43 Unspecified severe protein-calorie malnutrition: Secondary | ICD-10-CM | POA: Diagnosis not present

## 2019-06-02 DIAGNOSIS — Z8673 Personal history of transient ischemic attack (TIA), and cerebral infarction without residual deficits: Secondary | ICD-10-CM | POA: Diagnosis not present

## 2019-06-02 DIAGNOSIS — E43 Unspecified severe protein-calorie malnutrition: Secondary | ICD-10-CM | POA: Diagnosis not present

## 2019-06-03 DIAGNOSIS — Z8673 Personal history of transient ischemic attack (TIA), and cerebral infarction without residual deficits: Secondary | ICD-10-CM | POA: Diagnosis not present

## 2019-06-03 DIAGNOSIS — E43 Unspecified severe protein-calorie malnutrition: Secondary | ICD-10-CM | POA: Diagnosis not present

## 2019-06-04 DIAGNOSIS — E43 Unspecified severe protein-calorie malnutrition: Secondary | ICD-10-CM | POA: Diagnosis not present

## 2019-06-04 DIAGNOSIS — Z8673 Personal history of transient ischemic attack (TIA), and cerebral infarction without residual deficits: Secondary | ICD-10-CM | POA: Diagnosis not present

## 2019-06-05 DIAGNOSIS — E43 Unspecified severe protein-calorie malnutrition: Secondary | ICD-10-CM | POA: Diagnosis not present

## 2019-06-05 DIAGNOSIS — Z8673 Personal history of transient ischemic attack (TIA), and cerebral infarction without residual deficits: Secondary | ICD-10-CM | POA: Diagnosis not present

## 2019-06-06 DIAGNOSIS — E43 Unspecified severe protein-calorie malnutrition: Secondary | ICD-10-CM | POA: Diagnosis not present

## 2019-06-06 DIAGNOSIS — Z8673 Personal history of transient ischemic attack (TIA), and cerebral infarction without residual deficits: Secondary | ICD-10-CM | POA: Diagnosis not present

## 2019-06-09 DIAGNOSIS — E43 Unspecified severe protein-calorie malnutrition: Secondary | ICD-10-CM | POA: Diagnosis not present

## 2019-06-09 DIAGNOSIS — Z8673 Personal history of transient ischemic attack (TIA), and cerebral infarction without residual deficits: Secondary | ICD-10-CM | POA: Diagnosis not present

## 2019-06-10 ENCOUNTER — Ambulatory Visit: Payer: Medicaid Other | Admitting: Family Medicine

## 2019-06-10 DIAGNOSIS — Z8673 Personal history of transient ischemic attack (TIA), and cerebral infarction without residual deficits: Secondary | ICD-10-CM | POA: Diagnosis not present

## 2019-06-10 DIAGNOSIS — E43 Unspecified severe protein-calorie malnutrition: Secondary | ICD-10-CM | POA: Diagnosis not present

## 2019-06-11 DIAGNOSIS — Z8673 Personal history of transient ischemic attack (TIA), and cerebral infarction without residual deficits: Secondary | ICD-10-CM | POA: Diagnosis not present

## 2019-06-11 DIAGNOSIS — E43 Unspecified severe protein-calorie malnutrition: Secondary | ICD-10-CM | POA: Diagnosis not present

## 2019-06-12 DIAGNOSIS — Z8673 Personal history of transient ischemic attack (TIA), and cerebral infarction without residual deficits: Secondary | ICD-10-CM | POA: Diagnosis not present

## 2019-06-12 DIAGNOSIS — E43 Unspecified severe protein-calorie malnutrition: Secondary | ICD-10-CM | POA: Diagnosis not present

## 2019-06-13 DIAGNOSIS — Z8673 Personal history of transient ischemic attack (TIA), and cerebral infarction without residual deficits: Secondary | ICD-10-CM | POA: Diagnosis not present

## 2019-06-13 DIAGNOSIS — E43 Unspecified severe protein-calorie malnutrition: Secondary | ICD-10-CM | POA: Diagnosis not present

## 2019-06-16 DIAGNOSIS — Z8673 Personal history of transient ischemic attack (TIA), and cerebral infarction without residual deficits: Secondary | ICD-10-CM | POA: Diagnosis not present

## 2019-06-16 DIAGNOSIS — E43 Unspecified severe protein-calorie malnutrition: Secondary | ICD-10-CM | POA: Diagnosis not present

## 2019-06-17 DIAGNOSIS — E43 Unspecified severe protein-calorie malnutrition: Secondary | ICD-10-CM | POA: Diagnosis not present

## 2019-06-17 DIAGNOSIS — Z8673 Personal history of transient ischemic attack (TIA), and cerebral infarction without residual deficits: Secondary | ICD-10-CM | POA: Diagnosis not present

## 2019-06-18 DIAGNOSIS — E43 Unspecified severe protein-calorie malnutrition: Secondary | ICD-10-CM | POA: Diagnosis not present

## 2019-06-18 DIAGNOSIS — Z8673 Personal history of transient ischemic attack (TIA), and cerebral infarction without residual deficits: Secondary | ICD-10-CM | POA: Diagnosis not present

## 2019-06-19 DIAGNOSIS — E43 Unspecified severe protein-calorie malnutrition: Secondary | ICD-10-CM | POA: Diagnosis not present

## 2019-06-19 DIAGNOSIS — Z8673 Personal history of transient ischemic attack (TIA), and cerebral infarction without residual deficits: Secondary | ICD-10-CM | POA: Diagnosis not present

## 2019-06-20 DIAGNOSIS — E43 Unspecified severe protein-calorie malnutrition: Secondary | ICD-10-CM | POA: Diagnosis not present

## 2019-06-20 DIAGNOSIS — Z8673 Personal history of transient ischemic attack (TIA), and cerebral infarction without residual deficits: Secondary | ICD-10-CM | POA: Diagnosis not present

## 2019-06-23 DIAGNOSIS — E43 Unspecified severe protein-calorie malnutrition: Secondary | ICD-10-CM | POA: Diagnosis not present

## 2019-06-23 DIAGNOSIS — Z8673 Personal history of transient ischemic attack (TIA), and cerebral infarction without residual deficits: Secondary | ICD-10-CM | POA: Diagnosis not present

## 2019-06-24 DIAGNOSIS — Z8673 Personal history of transient ischemic attack (TIA), and cerebral infarction without residual deficits: Secondary | ICD-10-CM | POA: Diagnosis not present

## 2019-06-24 DIAGNOSIS — E43 Unspecified severe protein-calorie malnutrition: Secondary | ICD-10-CM | POA: Diagnosis not present

## 2019-06-25 DIAGNOSIS — E43 Unspecified severe protein-calorie malnutrition: Secondary | ICD-10-CM | POA: Diagnosis not present

## 2019-06-25 DIAGNOSIS — Z8673 Personal history of transient ischemic attack (TIA), and cerebral infarction without residual deficits: Secondary | ICD-10-CM | POA: Diagnosis not present

## 2019-06-26 DIAGNOSIS — E43 Unspecified severe protein-calorie malnutrition: Secondary | ICD-10-CM | POA: Diagnosis not present

## 2019-06-26 DIAGNOSIS — Z8673 Personal history of transient ischemic attack (TIA), and cerebral infarction without residual deficits: Secondary | ICD-10-CM | POA: Diagnosis not present

## 2019-06-27 DIAGNOSIS — E43 Unspecified severe protein-calorie malnutrition: Secondary | ICD-10-CM | POA: Diagnosis not present

## 2019-06-27 DIAGNOSIS — Z8673 Personal history of transient ischemic attack (TIA), and cerebral infarction without residual deficits: Secondary | ICD-10-CM | POA: Diagnosis not present

## 2019-06-30 ENCOUNTER — Encounter: Payer: Self-pay | Admitting: Family Medicine

## 2019-06-30 ENCOUNTER — Ambulatory Visit: Payer: Medicaid Other | Attending: Family Medicine | Admitting: Family Medicine

## 2019-06-30 ENCOUNTER — Other Ambulatory Visit: Payer: Self-pay

## 2019-06-30 VITALS — BP 158/94 | HR 70 | Ht 63.0 in | Wt 98.0 lb

## 2019-06-30 DIAGNOSIS — Z1231 Encounter for screening mammogram for malignant neoplasm of breast: Secondary | ICD-10-CM

## 2019-06-30 DIAGNOSIS — I10 Essential (primary) hypertension: Secondary | ICD-10-CM

## 2019-06-30 DIAGNOSIS — E43 Unspecified severe protein-calorie malnutrition: Secondary | ICD-10-CM | POA: Diagnosis not present

## 2019-06-30 MED ORDER — AMLODIPINE BESYLATE 10 MG PO TABS: 10.0000 mg | ORAL_TABLET | Freq: Every day | ORAL | 1 refills | Status: DC

## 2019-06-30 MED ORDER — CARVEDILOL 3.125 MG PO TABS: 3.1250 mg | ORAL_TABLET | Freq: Two times a day (BID) | ORAL | 1 refills | Status: DC

## 2019-06-30 NOTE — Progress Notes (Signed)
Subjective:  Patient ID: Erica Lopez, female    DOB: 10/04/1960  Age: 59 y.o. MRN: [id number]  CC: Hypertension   HPI Erica Lopez is a 59 year old female with Medical history significant for CVA in 2014 with resulting mild cognitive deficits and no hemiparesis, Hypertension, Protein calorie malnutrition who comes in for a follow-up of her hypertension accompanied by her daughter. She was previously on Remeron to help with improved appetite and weight gain however she has not taken this in a while and currently does not need it as per daughter. She is still underweight but is satisfied with her current appetite. Her blood pressure is elevated and she endorses compliance with her antihypertensive. She has no additional concerns today.  Past Medical History:  Diagnosis Date  . Hypertension   . Stroke [facility]) 2014    History reviewed. No pertinent surgical history.  Family History  Problem Relation Age of Onset  . Cancer Other   . Hypertension Other   . Stroke Other   . Heart disease Other   . Diabetes Other     Allergies  Allergen Reactions  . Aspirin Anaphylaxis and Other (See Comments)    Bleeding event  . Lisinopril Swelling  . Other Hives, Swelling and Other (See Comments)    Blueberries, causing hives and swelling    Outpatient Medications Prior to Visit  Medication Sig Dispense Refill  . mirtazapine (REMERON) 30 MG tablet Take 1 tablet (30 mg total) by mouth at bedtime. 30 tablet 6  . amLODipine (NORVASC) 10 MG tablet Take 1 tablet (10 mg total) by mouth daily. 30 tablet 0   No facility-administered medications prior to visit.     ROS Review of Systems  Constitutional: Negative for activity change, appetite change and fatigue.  HENT: Negative for congestion, sinus pressure and sore throat.   Eyes: Negative for visual disturbance.  Respiratory: Negative for cough, chest tightness, shortness of breath and wheezing.   Cardiovascular: Negative for chest pain and  palpitations.  Gastrointestinal: Negative for abdominal distention, abdominal pain and constipation.  Endocrine: Negative for polydipsia.  Genitourinary: Negative for dysuria and frequency.  Musculoskeletal: Negative for arthralgias and back pain.  Skin: Negative for rash.  Neurological: Negative for tremors, light-headedness and numbness.  Hematological: Does not bruise/bleed easily.  Psychiatric/Behavioral: Negative for agitation and behavioral problems.    Objective:  BP (!) 158/94   Pulse 70   Ht 5\' 3"  (1.6 m)   Wt 98 lb (44.5 kg)   SpO2 100%   BMI 17.36 kg/m   BP/Weight 06/30/2019 05/27/2018 81/08/4816  Systolic BP 563 149 -  Diastolic BP 94 90 -  Wt. (Lbs) 98 99 95.4  BMI 17.36 17.54 16.9      Physical Exam Constitutional:      Appearance: She is well-developed.     Comments: Underweight  Neck:     Vascular: No JVD.  Cardiovascular:     Rate and Rhythm: Normal rate.     Heart sounds: Normal heart sounds. No murmur.  Pulmonary:     Effort: Pulmonary effort is normal.     Breath sounds: Normal breath sounds. No wheezing or rales.  Chest:     Chest wall: No tenderness.  Abdominal:     General: Bowel sounds are normal. There is no distension.     Palpations: Abdomen is soft. There is no mass.     Tenderness: There is no abdominal tenderness.  Musculoskeletal:        General: Normal range  of motion.     Right lower leg: No edema.     Left lower leg: No edema.  Neurological:     General: No focal deficit present.     Mental Status: She is alert. Mental status is at baseline.  Psychiatric:        Mood and Affect: Mood normal.     CMP Latest Ref Rng & Units 04/23/2018 04/26/2017 01/05/2017  Glucose 65 - 99 mg/dL 628(B) 96 87  BUN 6 - 24 mg/dL 18 16 14   Creatinine 0.57 - 1.00 mg/dL ) 1.51(V 6.16)  Sodium 134 - 144 mmol/L 149(H) 144 138  Potassium 3.5 - 5.2 mmol/L 3.8 4.0 4.2  Chloride 96 - 106 mmol/L 105 107 103  CO2 20 - 29 mmol/L 23 - 22  Calcium 8.7 -  10.2 mg/dL 10.3(H) - 9.4  Total Protein 6.5 - 8.1 g/dL - - -  Total Bilirubin 0.3 - 1.2 mg/dL - - -  Alkaline Phos 38 - 126 U/L - - -  AST 15 - 41 U/L - - -  ALT 14 - 54 U/L - - -    Lipid Panel  No results found for: CHOL, TRIG, HDL, CHOLHDL, VLDL, LDLCALC, LDLDIRECT  CBC    Component Value Date/Time   WBC 4.5 01/05/2017 1847   RBC 5.29 (H) 01/05/2017 1847   HGB 16.3 (H) 04/26/2017 2300   HCT 48.0 (H) 04/26/2017 2300   PLT 168 01/05/2017 1847   MCV 69.9 (L) 01/05/2017 1847   MCH 22.3 (L) 01/05/2017 1847   MCHC 31.9 01/05/2017 1847   RDW 16.9 (H) 01/05/2017 1847   LYMPHSABS 0.9 01/05/2017 1847   MONOABS 0.2 01/05/2017 1847   EOSABS 0.0 01/05/2017 1847   BASOSABS 0.0 01/05/2017 1847    No results found for: HGBA1C  Assessment & Plan:  1. Essential hypertension Uncontrolled Would love to add ACE inhibitor however she is allergic Carvedilol added to regimen Counseled on blood pressure goal of less than 130/80, low-sodium, DASH diet, medication compliance, 150 minutes of moderate intensity exercise per week. Discussed medication compliance, adverse effects. - amLODipine (NORVASC) 10 MG tablet; Take 1 tablet (10 mg total) by mouth daily.  Dispense: 90 tablet; Refill: 1 - Basic Metabolic Panel - carvedilol (COREG) 3.125 MG tablet; Take 1 tablet (3.125 mg total) by mouth 2 (two) times daily with a meal.  Dispense: 180 tablet; Refill: 1  2. Severe protein-calorie malnutrition (HCC) Currently not on Remeron Advised to increase caloric intake  3. Encounter for screening mammogram for malignant neoplasm of breast - MM 3D SCREEN BREAST BILATERAL; Future    Meds ordered this encounter  Medications  . amLODipine (NORVASC) 10 MG tablet    Sig: Take 1 tablet (10 mg total) by mouth daily.    Dispense:  90 tablet    Refill:  1  . carvedilol (COREG) 3.125 MG tablet    Sig: Take 1 tablet (3.125 mg total) by mouth 2 (two) times daily with a meal.    Dispense:  180 tablet     Refill:  1    Follow-up: Return in about 6 months (around 12/28/2019) for Chronic medical conditions.       02/27/2020, MD, FAAFP. [facility] [place], [place]   06/30/2019, 5:09 PM

## 2019-06-30 NOTE — Patient Instructions (Signed)
Once I review your test results, I will be in touch with you via 'my chart' or a phone call from my office (if you are not signed up for my chart).  

## 2019-07-02 LAB — BASIC METABOLIC PANEL
BUN/Creatinine Ratio: 18 (ref 9–23)
BUN: 19 mg/dL (ref 6–24)
CO2: 23 mmol/L (ref 20–29)
Calcium: 9.8 mg/dL (ref 8.7–10.2)
Chloride: 104 mmol/L (ref 96–106)
Creatinine, Ser: 1.04 mg/dL — ABNORMAL HIGH (ref 0.57–1.00)
GFR calc Af Amer: 68 mL/min/{1.73_m2} (ref >59)
GFR calc non Af Amer: 59 mL/min/{1.73_m2} — ABNORMAL LOW (ref >59)
Glucose: 72 mg/dL (ref 65–99)
Potassium: 4.3 mmol/L (ref 3.5–5.2)
Sodium: 143 mmol/L (ref 134–144)

## 2019-07-03 ENCOUNTER — Telehealth: Payer: Self-pay

## 2019-07-03 NOTE — Telephone Encounter (Signed)
Patient's daughter was called and given lab results for patient.

## 2019-07-03 NOTE — Telephone Encounter (Signed)
-----   Message from Hoy Register, MD sent at 07/03/2019 12:36 PM EST ----- Please inform the patient that labs are normal. Thank you.

## 2019-07-07 DIAGNOSIS — E43 Unspecified severe protein-calorie malnutrition: Secondary | ICD-10-CM | POA: Diagnosis not present

## 2019-07-07 DIAGNOSIS — Z8673 Personal history of transient ischemic attack (TIA), and cerebral infarction without residual deficits: Secondary | ICD-10-CM | POA: Diagnosis not present

## 2019-07-08 DIAGNOSIS — Z8673 Personal history of transient ischemic attack (TIA), and cerebral infarction without residual deficits: Secondary | ICD-10-CM | POA: Diagnosis not present

## 2019-07-08 DIAGNOSIS — E43 Unspecified severe protein-calorie malnutrition: Secondary | ICD-10-CM | POA: Diagnosis not present

## 2019-07-09 DIAGNOSIS — E43 Unspecified severe protein-calorie malnutrition: Secondary | ICD-10-CM | POA: Diagnosis not present

## 2019-07-09 DIAGNOSIS — Z8673 Personal history of transient ischemic attack (TIA), and cerebral infarction without residual deficits: Secondary | ICD-10-CM | POA: Diagnosis not present

## 2019-07-10 DIAGNOSIS — E43 Unspecified severe protein-calorie malnutrition: Secondary | ICD-10-CM | POA: Diagnosis not present

## 2019-07-10 DIAGNOSIS — Z8673 Personal history of transient ischemic attack (TIA), and cerebral infarction without residual deficits: Secondary | ICD-10-CM | POA: Diagnosis not present

## 2019-07-11 DIAGNOSIS — Z8673 Personal history of transient ischemic attack (TIA), and cerebral infarction without residual deficits: Secondary | ICD-10-CM | POA: Diagnosis not present

## 2019-07-11 DIAGNOSIS — E43 Unspecified severe protein-calorie malnutrition: Secondary | ICD-10-CM | POA: Diagnosis not present

## 2019-07-14 DIAGNOSIS — E43 Unspecified severe protein-calorie malnutrition: Secondary | ICD-10-CM | POA: Diagnosis not present

## 2019-07-14 DIAGNOSIS — Z8673 Personal history of transient ischemic attack (TIA), and cerebral infarction without residual deficits: Secondary | ICD-10-CM | POA: Diagnosis not present

## 2019-07-15 DIAGNOSIS — E43 Unspecified severe protein-calorie malnutrition: Secondary | ICD-10-CM | POA: Diagnosis not present

## 2019-07-15 DIAGNOSIS — Z8673 Personal history of transient ischemic attack (TIA), and cerebral infarction without residual deficits: Secondary | ICD-10-CM | POA: Diagnosis not present

## 2019-07-16 DIAGNOSIS — Z8673 Personal history of transient ischemic attack (TIA), and cerebral infarction without residual deficits: Secondary | ICD-10-CM | POA: Diagnosis not present

## 2019-07-16 DIAGNOSIS — E43 Unspecified severe protein-calorie malnutrition: Secondary | ICD-10-CM | POA: Diagnosis not present

## 2019-07-17 DIAGNOSIS — E43 Unspecified severe protein-calorie malnutrition: Secondary | ICD-10-CM | POA: Diagnosis not present

## 2019-07-17 DIAGNOSIS — Z8673 Personal history of transient ischemic attack (TIA), and cerebral infarction without residual deficits: Secondary | ICD-10-CM | POA: Diagnosis not present

## 2019-07-18 DIAGNOSIS — E43 Unspecified severe protein-calorie malnutrition: Secondary | ICD-10-CM | POA: Diagnosis not present

## 2019-07-18 DIAGNOSIS — Z8673 Personal history of transient ischemic attack (TIA), and cerebral infarction without residual deficits: Secondary | ICD-10-CM | POA: Diagnosis not present

## 2019-07-21 DIAGNOSIS — E43 Unspecified severe protein-calorie malnutrition: Secondary | ICD-10-CM | POA: Diagnosis not present

## 2019-07-21 DIAGNOSIS — Z8673 Personal history of transient ischemic attack (TIA), and cerebral infarction without residual deficits: Secondary | ICD-10-CM | POA: Diagnosis not present

## 2019-07-22 DIAGNOSIS — E43 Unspecified severe protein-calorie malnutrition: Secondary | ICD-10-CM | POA: Diagnosis not present

## 2019-07-22 DIAGNOSIS — Z8673 Personal history of transient ischemic attack (TIA), and cerebral infarction without residual deficits: Secondary | ICD-10-CM | POA: Diagnosis not present

## 2019-07-23 DIAGNOSIS — Z8673 Personal history of transient ischemic attack (TIA), and cerebral infarction without residual deficits: Secondary | ICD-10-CM | POA: Diagnosis not present

## 2019-07-23 DIAGNOSIS — E43 Unspecified severe protein-calorie malnutrition: Secondary | ICD-10-CM | POA: Diagnosis not present

## 2019-07-24 DIAGNOSIS — Z8673 Personal history of transient ischemic attack (TIA), and cerebral infarction without residual deficits: Secondary | ICD-10-CM | POA: Diagnosis not present

## 2019-07-24 DIAGNOSIS — E43 Unspecified severe protein-calorie malnutrition: Secondary | ICD-10-CM | POA: Diagnosis not present

## 2019-07-25 DIAGNOSIS — E43 Unspecified severe protein-calorie malnutrition: Secondary | ICD-10-CM | POA: Diagnosis not present

## 2019-07-25 DIAGNOSIS — Z8673 Personal history of transient ischemic attack (TIA), and cerebral infarction without residual deficits: Secondary | ICD-10-CM | POA: Diagnosis not present

## 2019-07-28 DIAGNOSIS — E43 Unspecified severe protein-calorie malnutrition: Secondary | ICD-10-CM | POA: Diagnosis not present

## 2019-07-28 DIAGNOSIS — Z8673 Personal history of transient ischemic attack (TIA), and cerebral infarction without residual deficits: Secondary | ICD-10-CM | POA: Diagnosis not present

## 2019-07-29 DIAGNOSIS — E43 Unspecified severe protein-calorie malnutrition: Secondary | ICD-10-CM | POA: Diagnosis not present

## 2019-07-29 DIAGNOSIS — Z8673 Personal history of transient ischemic attack (TIA), and cerebral infarction without residual deficits: Secondary | ICD-10-CM | POA: Diagnosis not present

## 2019-07-30 DIAGNOSIS — E43 Unspecified severe protein-calorie malnutrition: Secondary | ICD-10-CM | POA: Diagnosis not present

## 2019-07-30 DIAGNOSIS — Z8673 Personal history of transient ischemic attack (TIA), and cerebral infarction without residual deficits: Secondary | ICD-10-CM | POA: Diagnosis not present

## 2019-07-31 DIAGNOSIS — E43 Unspecified severe protein-calorie malnutrition: Secondary | ICD-10-CM | POA: Diagnosis not present

## 2019-07-31 DIAGNOSIS — Z8673 Personal history of transient ischemic attack (TIA), and cerebral infarction without residual deficits: Secondary | ICD-10-CM | POA: Diagnosis not present

## 2019-08-01 DIAGNOSIS — Z8673 Personal history of transient ischemic attack (TIA), and cerebral infarction without residual deficits: Secondary | ICD-10-CM | POA: Diagnosis not present

## 2019-08-01 DIAGNOSIS — E43 Unspecified severe protein-calorie malnutrition: Secondary | ICD-10-CM | POA: Diagnosis not present

## 2019-08-04 DIAGNOSIS — Z8673 Personal history of transient ischemic attack (TIA), and cerebral infarction without residual deficits: Secondary | ICD-10-CM | POA: Diagnosis not present

## 2019-08-04 DIAGNOSIS — E43 Unspecified severe protein-calorie malnutrition: Secondary | ICD-10-CM | POA: Diagnosis not present

## 2019-08-05 DIAGNOSIS — R32 Unspecified urinary incontinence: Secondary | ICD-10-CM | POA: Diagnosis not present

## 2019-08-05 DIAGNOSIS — E43 Unspecified severe protein-calorie malnutrition: Secondary | ICD-10-CM | POA: Diagnosis not present

## 2019-08-05 DIAGNOSIS — Z8673 Personal history of transient ischemic attack (TIA), and cerebral infarction without residual deficits: Secondary | ICD-10-CM | POA: Diagnosis not present

## 2019-08-06 DIAGNOSIS — E43 Unspecified severe protein-calorie malnutrition: Secondary | ICD-10-CM | POA: Diagnosis not present

## 2019-08-06 DIAGNOSIS — Z8673 Personal history of transient ischemic attack (TIA), and cerebral infarction without residual deficits: Secondary | ICD-10-CM | POA: Diagnosis not present

## 2019-08-07 DIAGNOSIS — Z8673 Personal history of transient ischemic attack (TIA), and cerebral infarction without residual deficits: Secondary | ICD-10-CM | POA: Diagnosis not present

## 2019-08-07 DIAGNOSIS — E43 Unspecified severe protein-calorie malnutrition: Secondary | ICD-10-CM | POA: Diagnosis not present

## 2019-08-08 DIAGNOSIS — E43 Unspecified severe protein-calorie malnutrition: Secondary | ICD-10-CM | POA: Diagnosis not present

## 2019-08-08 DIAGNOSIS — Z8673 Personal history of transient ischemic attack (TIA), and cerebral infarction without residual deficits: Secondary | ICD-10-CM | POA: Diagnosis not present

## 2019-08-11 DIAGNOSIS — E43 Unspecified severe protein-calorie malnutrition: Secondary | ICD-10-CM | POA: Diagnosis not present

## 2019-08-11 DIAGNOSIS — Z8673 Personal history of transient ischemic attack (TIA), and cerebral infarction without residual deficits: Secondary | ICD-10-CM | POA: Diagnosis not present

## 2019-08-12 DIAGNOSIS — Z8673 Personal history of transient ischemic attack (TIA), and cerebral infarction without residual deficits: Secondary | ICD-10-CM | POA: Diagnosis not present

## 2019-08-12 DIAGNOSIS — E43 Unspecified severe protein-calorie malnutrition: Secondary | ICD-10-CM | POA: Diagnosis not present

## 2019-08-13 DIAGNOSIS — E43 Unspecified severe protein-calorie malnutrition: Secondary | ICD-10-CM | POA: Diagnosis not present

## 2019-08-13 DIAGNOSIS — Z8673 Personal history of transient ischemic attack (TIA), and cerebral infarction without residual deficits: Secondary | ICD-10-CM | POA: Diagnosis not present

## 2019-08-14 DIAGNOSIS — Z8673 Personal history of transient ischemic attack (TIA), and cerebral infarction without residual deficits: Secondary | ICD-10-CM | POA: Diagnosis not present

## 2019-08-14 DIAGNOSIS — E43 Unspecified severe protein-calorie malnutrition: Secondary | ICD-10-CM | POA: Diagnosis not present

## 2019-08-15 DIAGNOSIS — Z8673 Personal history of transient ischemic attack (TIA), and cerebral infarction without residual deficits: Secondary | ICD-10-CM | POA: Diagnosis not present

## 2019-08-15 DIAGNOSIS — E43 Unspecified severe protein-calorie malnutrition: Secondary | ICD-10-CM | POA: Diagnosis not present

## 2019-08-18 DIAGNOSIS — E43 Unspecified severe protein-calorie malnutrition: Secondary | ICD-10-CM | POA: Diagnosis not present

## 2019-08-18 DIAGNOSIS — Z8673 Personal history of transient ischemic attack (TIA), and cerebral infarction without residual deficits: Secondary | ICD-10-CM | POA: Diagnosis not present

## 2019-08-19 DIAGNOSIS — E43 Unspecified severe protein-calorie malnutrition: Secondary | ICD-10-CM | POA: Diagnosis not present

## 2019-08-19 DIAGNOSIS — Z8673 Personal history of transient ischemic attack (TIA), and cerebral infarction without residual deficits: Secondary | ICD-10-CM | POA: Diagnosis not present

## 2019-08-20 DIAGNOSIS — Z8673 Personal history of transient ischemic attack (TIA), and cerebral infarction without residual deficits: Secondary | ICD-10-CM | POA: Diagnosis not present

## 2019-08-20 DIAGNOSIS — E43 Unspecified severe protein-calorie malnutrition: Secondary | ICD-10-CM | POA: Diagnosis not present

## 2019-08-21 DIAGNOSIS — Z8673 Personal history of transient ischemic attack (TIA), and cerebral infarction without residual deficits: Secondary | ICD-10-CM | POA: Diagnosis not present

## 2019-08-21 DIAGNOSIS — E43 Unspecified severe protein-calorie malnutrition: Secondary | ICD-10-CM | POA: Diagnosis not present

## 2019-08-25 DIAGNOSIS — E43 Unspecified severe protein-calorie malnutrition: Secondary | ICD-10-CM | POA: Diagnosis not present

## 2019-08-25 DIAGNOSIS — Z8673 Personal history of transient ischemic attack (TIA), and cerebral infarction without residual deficits: Secondary | ICD-10-CM | POA: Diagnosis not present

## 2019-08-26 DIAGNOSIS — E43 Unspecified severe protein-calorie malnutrition: Secondary | ICD-10-CM | POA: Diagnosis not present

## 2019-08-26 DIAGNOSIS — Z8673 Personal history of transient ischemic attack (TIA), and cerebral infarction without residual deficits: Secondary | ICD-10-CM | POA: Diagnosis not present

## 2019-08-27 DIAGNOSIS — Z8673 Personal history of transient ischemic attack (TIA), and cerebral infarction without residual deficits: Secondary | ICD-10-CM | POA: Diagnosis not present

## 2019-08-27 DIAGNOSIS — E43 Unspecified severe protein-calorie malnutrition: Secondary | ICD-10-CM | POA: Diagnosis not present

## 2019-08-28 DIAGNOSIS — Z8673 Personal history of transient ischemic attack (TIA), and cerebral infarction without residual deficits: Secondary | ICD-10-CM | POA: Diagnosis not present

## 2019-08-28 DIAGNOSIS — E43 Unspecified severe protein-calorie malnutrition: Secondary | ICD-10-CM | POA: Diagnosis not present

## 2019-08-29 DIAGNOSIS — Z8673 Personal history of transient ischemic attack (TIA), and cerebral infarction without residual deficits: Secondary | ICD-10-CM | POA: Diagnosis not present

## 2019-08-29 DIAGNOSIS — E43 Unspecified severe protein-calorie malnutrition: Secondary | ICD-10-CM | POA: Diagnosis not present

## 2019-09-01 DIAGNOSIS — Z8673 Personal history of transient ischemic attack (TIA), and cerebral infarction without residual deficits: Secondary | ICD-10-CM | POA: Diagnosis not present

## 2019-09-01 DIAGNOSIS — E43 Unspecified severe protein-calorie malnutrition: Secondary | ICD-10-CM | POA: Diagnosis not present

## 2019-09-02 DIAGNOSIS — E43 Unspecified severe protein-calorie malnutrition: Secondary | ICD-10-CM | POA: Diagnosis not present

## 2019-09-02 DIAGNOSIS — Z8673 Personal history of transient ischemic attack (TIA), and cerebral infarction without residual deficits: Secondary | ICD-10-CM | POA: Diagnosis not present

## 2019-09-03 DIAGNOSIS — E43 Unspecified severe protein-calorie malnutrition: Secondary | ICD-10-CM | POA: Diagnosis not present

## 2019-09-03 DIAGNOSIS — Z8673 Personal history of transient ischemic attack (TIA), and cerebral infarction without residual deficits: Secondary | ICD-10-CM | POA: Diagnosis not present

## 2019-09-04 DIAGNOSIS — Z8673 Personal history of transient ischemic attack (TIA), and cerebral infarction without residual deficits: Secondary | ICD-10-CM | POA: Diagnosis not present

## 2019-09-04 DIAGNOSIS — E43 Unspecified severe protein-calorie malnutrition: Secondary | ICD-10-CM | POA: Diagnosis not present

## 2019-09-05 DIAGNOSIS — E43 Unspecified severe protein-calorie malnutrition: Secondary | ICD-10-CM | POA: Diagnosis not present

## 2019-09-05 DIAGNOSIS — Z8673 Personal history of transient ischemic attack (TIA), and cerebral infarction without residual deficits: Secondary | ICD-10-CM | POA: Diagnosis not present

## 2019-09-08 DIAGNOSIS — E43 Unspecified severe protein-calorie malnutrition: Secondary | ICD-10-CM | POA: Diagnosis not present

## 2019-09-08 DIAGNOSIS — Z8673 Personal history of transient ischemic attack (TIA), and cerebral infarction without residual deficits: Secondary | ICD-10-CM | POA: Diagnosis not present

## 2019-09-09 DIAGNOSIS — E43 Unspecified severe protein-calorie malnutrition: Secondary | ICD-10-CM | POA: Diagnosis not present

## 2019-09-09 DIAGNOSIS — Z8673 Personal history of transient ischemic attack (TIA), and cerebral infarction without residual deficits: Secondary | ICD-10-CM | POA: Diagnosis not present

## 2019-09-10 DIAGNOSIS — Z8673 Personal history of transient ischemic attack (TIA), and cerebral infarction without residual deficits: Secondary | ICD-10-CM | POA: Diagnosis not present

## 2019-09-10 DIAGNOSIS — E43 Unspecified severe protein-calorie malnutrition: Secondary | ICD-10-CM | POA: Diagnosis not present

## 2019-09-11 DIAGNOSIS — Z8673 Personal history of transient ischemic attack (TIA), and cerebral infarction without residual deficits: Secondary | ICD-10-CM | POA: Diagnosis not present

## 2019-09-11 DIAGNOSIS — E43 Unspecified severe protein-calorie malnutrition: Secondary | ICD-10-CM | POA: Diagnosis not present

## 2019-09-12 DIAGNOSIS — E43 Unspecified severe protein-calorie malnutrition: Secondary | ICD-10-CM | POA: Diagnosis not present

## 2019-09-12 DIAGNOSIS — Z8673 Personal history of transient ischemic attack (TIA), and cerebral infarction without residual deficits: Secondary | ICD-10-CM | POA: Diagnosis not present

## 2019-09-15 DIAGNOSIS — Z8673 Personal history of transient ischemic attack (TIA), and cerebral infarction without residual deficits: Secondary | ICD-10-CM | POA: Diagnosis not present

## 2019-09-15 DIAGNOSIS — E43 Unspecified severe protein-calorie malnutrition: Secondary | ICD-10-CM | POA: Diagnosis not present

## 2019-09-16 DIAGNOSIS — E43 Unspecified severe protein-calorie malnutrition: Secondary | ICD-10-CM | POA: Diagnosis not present

## 2019-09-16 DIAGNOSIS — Z8673 Personal history of transient ischemic attack (TIA), and cerebral infarction without residual deficits: Secondary | ICD-10-CM | POA: Diagnosis not present

## 2019-09-17 DIAGNOSIS — E43 Unspecified severe protein-calorie malnutrition: Secondary | ICD-10-CM | POA: Diagnosis not present

## 2019-09-17 DIAGNOSIS — Z8673 Personal history of transient ischemic attack (TIA), and cerebral infarction without residual deficits: Secondary | ICD-10-CM | POA: Diagnosis not present

## 2019-09-18 DIAGNOSIS — Z8673 Personal history of transient ischemic attack (TIA), and cerebral infarction without residual deficits: Secondary | ICD-10-CM | POA: Diagnosis not present

## 2019-09-18 DIAGNOSIS — E43 Unspecified severe protein-calorie malnutrition: Secondary | ICD-10-CM | POA: Diagnosis not present

## 2019-09-19 DIAGNOSIS — E43 Unspecified severe protein-calorie malnutrition: Secondary | ICD-10-CM | POA: Diagnosis not present

## 2019-09-19 DIAGNOSIS — Z8673 Personal history of transient ischemic attack (TIA), and cerebral infarction without residual deficits: Secondary | ICD-10-CM | POA: Diagnosis not present

## 2019-09-22 DIAGNOSIS — Z8673 Personal history of transient ischemic attack (TIA), and cerebral infarction without residual deficits: Secondary | ICD-10-CM | POA: Diagnosis not present

## 2019-09-22 DIAGNOSIS — E43 Unspecified severe protein-calorie malnutrition: Secondary | ICD-10-CM | POA: Diagnosis not present

## 2019-09-23 DIAGNOSIS — Z8673 Personal history of transient ischemic attack (TIA), and cerebral infarction without residual deficits: Secondary | ICD-10-CM | POA: Diagnosis not present

## 2019-09-23 DIAGNOSIS — E43 Unspecified severe protein-calorie malnutrition: Secondary | ICD-10-CM | POA: Diagnosis not present

## 2019-09-24 DIAGNOSIS — E43 Unspecified severe protein-calorie malnutrition: Secondary | ICD-10-CM | POA: Diagnosis not present

## 2019-09-24 DIAGNOSIS — Z8673 Personal history of transient ischemic attack (TIA), and cerebral infarction without residual deficits: Secondary | ICD-10-CM | POA: Diagnosis not present

## 2019-09-25 DIAGNOSIS — Z8673 Personal history of transient ischemic attack (TIA), and cerebral infarction without residual deficits: Secondary | ICD-10-CM | POA: Diagnosis not present

## 2019-09-25 DIAGNOSIS — E43 Unspecified severe protein-calorie malnutrition: Secondary | ICD-10-CM | POA: Diagnosis not present

## 2019-09-26 DIAGNOSIS — Z8673 Personal history of transient ischemic attack (TIA), and cerebral infarction without residual deficits: Secondary | ICD-10-CM | POA: Diagnosis not present

## 2019-09-26 DIAGNOSIS — E43 Unspecified severe protein-calorie malnutrition: Secondary | ICD-10-CM | POA: Diagnosis not present

## 2019-09-29 DIAGNOSIS — Z8673 Personal history of transient ischemic attack (TIA), and cerebral infarction without residual deficits: Secondary | ICD-10-CM | POA: Diagnosis not present

## 2019-09-29 DIAGNOSIS — E43 Unspecified severe protein-calorie malnutrition: Secondary | ICD-10-CM | POA: Diagnosis not present

## 2019-09-30 DIAGNOSIS — R32 Unspecified urinary incontinence: Secondary | ICD-10-CM | POA: Diagnosis not present

## 2019-09-30 DIAGNOSIS — E43 Unspecified severe protein-calorie malnutrition: Secondary | ICD-10-CM | POA: Diagnosis not present

## 2019-09-30 DIAGNOSIS — Z8673 Personal history of transient ischemic attack (TIA), and cerebral infarction without residual deficits: Secondary | ICD-10-CM | POA: Diagnosis not present

## 2019-10-01 DIAGNOSIS — E43 Unspecified severe protein-calorie malnutrition: Secondary | ICD-10-CM | POA: Diagnosis not present

## 2019-10-01 DIAGNOSIS — Z8673 Personal history of transient ischemic attack (TIA), and cerebral infarction without residual deficits: Secondary | ICD-10-CM | POA: Diagnosis not present

## 2019-10-02 DIAGNOSIS — E43 Unspecified severe protein-calorie malnutrition: Secondary | ICD-10-CM | POA: Diagnosis not present

## 2019-10-02 DIAGNOSIS — Z8673 Personal history of transient ischemic attack (TIA), and cerebral infarction without residual deficits: Secondary | ICD-10-CM | POA: Diagnosis not present

## 2019-10-03 DIAGNOSIS — Z8673 Personal history of transient ischemic attack (TIA), and cerebral infarction without residual deficits: Secondary | ICD-10-CM | POA: Diagnosis not present

## 2019-10-03 DIAGNOSIS — E43 Unspecified severe protein-calorie malnutrition: Secondary | ICD-10-CM | POA: Diagnosis not present

## 2019-10-06 DIAGNOSIS — E43 Unspecified severe protein-calorie malnutrition: Secondary | ICD-10-CM | POA: Diagnosis not present

## 2019-10-06 DIAGNOSIS — Z8673 Personal history of transient ischemic attack (TIA), and cerebral infarction without residual deficits: Secondary | ICD-10-CM | POA: Diagnosis not present

## 2019-10-07 DIAGNOSIS — E43 Unspecified severe protein-calorie malnutrition: Secondary | ICD-10-CM | POA: Diagnosis not present

## 2019-10-07 DIAGNOSIS — Z8673 Personal history of transient ischemic attack (TIA), and cerebral infarction without residual deficits: Secondary | ICD-10-CM | POA: Diagnosis not present

## 2019-10-08 DIAGNOSIS — Z8673 Personal history of transient ischemic attack (TIA), and cerebral infarction without residual deficits: Secondary | ICD-10-CM | POA: Diagnosis not present

## 2019-10-08 DIAGNOSIS — E43 Unspecified severe protein-calorie malnutrition: Secondary | ICD-10-CM | POA: Diagnosis not present

## 2019-10-09 DIAGNOSIS — E43 Unspecified severe protein-calorie malnutrition: Secondary | ICD-10-CM | POA: Diagnosis not present

## 2019-10-09 DIAGNOSIS — Z8673 Personal history of transient ischemic attack (TIA), and cerebral infarction without residual deficits: Secondary | ICD-10-CM | POA: Diagnosis not present

## 2019-10-10 DIAGNOSIS — E43 Unspecified severe protein-calorie malnutrition: Secondary | ICD-10-CM | POA: Diagnosis not present

## 2019-10-10 DIAGNOSIS — Z8673 Personal history of transient ischemic attack (TIA), and cerebral infarction without residual deficits: Secondary | ICD-10-CM | POA: Diagnosis not present

## 2019-10-13 DIAGNOSIS — E43 Unspecified severe protein-calorie malnutrition: Secondary | ICD-10-CM | POA: Diagnosis not present

## 2019-10-13 DIAGNOSIS — Z8673 Personal history of transient ischemic attack (TIA), and cerebral infarction without residual deficits: Secondary | ICD-10-CM | POA: Diagnosis not present

## 2019-10-14 DIAGNOSIS — Z8673 Personal history of transient ischemic attack (TIA), and cerebral infarction without residual deficits: Secondary | ICD-10-CM | POA: Diagnosis not present

## 2019-10-14 DIAGNOSIS — E43 Unspecified severe protein-calorie malnutrition: Secondary | ICD-10-CM | POA: Diagnosis not present

## 2019-10-15 DIAGNOSIS — Z8673 Personal history of transient ischemic attack (TIA), and cerebral infarction without residual deficits: Secondary | ICD-10-CM | POA: Diagnosis not present

## 2019-10-15 DIAGNOSIS — E43 Unspecified severe protein-calorie malnutrition: Secondary | ICD-10-CM | POA: Diagnosis not present

## 2019-10-16 DIAGNOSIS — Z8673 Personal history of transient ischemic attack (TIA), and cerebral infarction without residual deficits: Secondary | ICD-10-CM | POA: Diagnosis not present

## 2019-10-16 DIAGNOSIS — E43 Unspecified severe protein-calorie malnutrition: Secondary | ICD-10-CM | POA: Diagnosis not present

## 2019-10-17 DIAGNOSIS — Z8673 Personal history of transient ischemic attack (TIA), and cerebral infarction without residual deficits: Secondary | ICD-10-CM | POA: Diagnosis not present

## 2019-10-17 DIAGNOSIS — E43 Unspecified severe protein-calorie malnutrition: Secondary | ICD-10-CM | POA: Diagnosis not present

## 2019-10-20 DIAGNOSIS — E43 Unspecified severe protein-calorie malnutrition: Secondary | ICD-10-CM | POA: Diagnosis not present

## 2019-10-20 DIAGNOSIS — Z8673 Personal history of transient ischemic attack (TIA), and cerebral infarction without residual deficits: Secondary | ICD-10-CM | POA: Diagnosis not present

## 2019-10-21 DIAGNOSIS — Z8673 Personal history of transient ischemic attack (TIA), and cerebral infarction without residual deficits: Secondary | ICD-10-CM | POA: Diagnosis not present

## 2019-10-21 DIAGNOSIS — E43 Unspecified severe protein-calorie malnutrition: Secondary | ICD-10-CM | POA: Diagnosis not present

## 2019-10-22 DIAGNOSIS — Z8673 Personal history of transient ischemic attack (TIA), and cerebral infarction without residual deficits: Secondary | ICD-10-CM | POA: Diagnosis not present

## 2019-10-22 DIAGNOSIS — E43 Unspecified severe protein-calorie malnutrition: Secondary | ICD-10-CM | POA: Diagnosis not present

## 2019-10-23 DIAGNOSIS — Z8673 Personal history of transient ischemic attack (TIA), and cerebral infarction without residual deficits: Secondary | ICD-10-CM | POA: Diagnosis not present

## 2019-10-23 DIAGNOSIS — E43 Unspecified severe protein-calorie malnutrition: Secondary | ICD-10-CM | POA: Diagnosis not present

## 2019-10-24 DIAGNOSIS — Z8673 Personal history of transient ischemic attack (TIA), and cerebral infarction without residual deficits: Secondary | ICD-10-CM | POA: Diagnosis not present

## 2019-10-24 DIAGNOSIS — E43 Unspecified severe protein-calorie malnutrition: Secondary | ICD-10-CM | POA: Diagnosis not present

## 2019-10-27 DIAGNOSIS — Z8673 Personal history of transient ischemic attack (TIA), and cerebral infarction without residual deficits: Secondary | ICD-10-CM | POA: Diagnosis not present

## 2019-10-27 DIAGNOSIS — E43 Unspecified severe protein-calorie malnutrition: Secondary | ICD-10-CM | POA: Diagnosis not present

## 2019-10-28 DIAGNOSIS — E43 Unspecified severe protein-calorie malnutrition: Secondary | ICD-10-CM | POA: Diagnosis not present

## 2019-10-28 DIAGNOSIS — Z8673 Personal history of transient ischemic attack (TIA), and cerebral infarction without residual deficits: Secondary | ICD-10-CM | POA: Diagnosis not present

## 2019-10-29 DIAGNOSIS — Z8673 Personal history of transient ischemic attack (TIA), and cerebral infarction without residual deficits: Secondary | ICD-10-CM | POA: Diagnosis not present

## 2019-10-29 DIAGNOSIS — E43 Unspecified severe protein-calorie malnutrition: Secondary | ICD-10-CM | POA: Diagnosis not present

## 2019-10-30 DIAGNOSIS — Z8673 Personal history of transient ischemic attack (TIA), and cerebral infarction without residual deficits: Secondary | ICD-10-CM | POA: Diagnosis not present

## 2019-10-30 DIAGNOSIS — R32 Unspecified urinary incontinence: Secondary | ICD-10-CM | POA: Diagnosis not present

## 2019-10-30 DIAGNOSIS — E43 Unspecified severe protein-calorie malnutrition: Secondary | ICD-10-CM | POA: Diagnosis not present

## 2019-10-31 DIAGNOSIS — E43 Unspecified severe protein-calorie malnutrition: Secondary | ICD-10-CM | POA: Diagnosis not present

## 2019-10-31 DIAGNOSIS — Z8673 Personal history of transient ischemic attack (TIA), and cerebral infarction without residual deficits: Secondary | ICD-10-CM | POA: Diagnosis not present

## 2019-11-03 DIAGNOSIS — Z8673 Personal history of transient ischemic attack (TIA), and cerebral infarction without residual deficits: Secondary | ICD-10-CM | POA: Diagnosis not present

## 2019-11-03 DIAGNOSIS — E43 Unspecified severe protein-calorie malnutrition: Secondary | ICD-10-CM | POA: Diagnosis not present

## 2019-11-04 DIAGNOSIS — E43 Unspecified severe protein-calorie malnutrition: Secondary | ICD-10-CM | POA: Diagnosis not present

## 2019-11-04 DIAGNOSIS — Z8673 Personal history of transient ischemic attack (TIA), and cerebral infarction without residual deficits: Secondary | ICD-10-CM | POA: Diagnosis not present

## 2019-11-05 DIAGNOSIS — E43 Unspecified severe protein-calorie malnutrition: Secondary | ICD-10-CM | POA: Diagnosis not present

## 2019-11-05 DIAGNOSIS — Z8673 Personal history of transient ischemic attack (TIA), and cerebral infarction without residual deficits: Secondary | ICD-10-CM | POA: Diagnosis not present

## 2019-11-06 DIAGNOSIS — E43 Unspecified severe protein-calorie malnutrition: Secondary | ICD-10-CM | POA: Diagnosis not present

## 2019-11-06 DIAGNOSIS — Z8673 Personal history of transient ischemic attack (TIA), and cerebral infarction without residual deficits: Secondary | ICD-10-CM | POA: Diagnosis not present

## 2019-11-07 DIAGNOSIS — E43 Unspecified severe protein-calorie malnutrition: Secondary | ICD-10-CM | POA: Diagnosis not present

## 2019-11-07 DIAGNOSIS — Z8673 Personal history of transient ischemic attack (TIA), and cerebral infarction without residual deficits: Secondary | ICD-10-CM | POA: Diagnosis not present

## 2019-11-10 DIAGNOSIS — E43 Unspecified severe protein-calorie malnutrition: Secondary | ICD-10-CM | POA: Diagnosis not present

## 2019-11-10 DIAGNOSIS — Z8673 Personal history of transient ischemic attack (TIA), and cerebral infarction without residual deficits: Secondary | ICD-10-CM | POA: Diagnosis not present

## 2019-11-11 DIAGNOSIS — Z8673 Personal history of transient ischemic attack (TIA), and cerebral infarction without residual deficits: Secondary | ICD-10-CM | POA: Diagnosis not present

## 2019-11-11 DIAGNOSIS — E43 Unspecified severe protein-calorie malnutrition: Secondary | ICD-10-CM | POA: Diagnosis not present

## 2019-11-12 DIAGNOSIS — Z8673 Personal history of transient ischemic attack (TIA), and cerebral infarction without residual deficits: Secondary | ICD-10-CM | POA: Diagnosis not present

## 2019-11-12 DIAGNOSIS — E43 Unspecified severe protein-calorie malnutrition: Secondary | ICD-10-CM | POA: Diagnosis not present

## 2019-11-13 DIAGNOSIS — E43 Unspecified severe protein-calorie malnutrition: Secondary | ICD-10-CM | POA: Diagnosis not present

## 2019-11-13 DIAGNOSIS — Z8673 Personal history of transient ischemic attack (TIA), and cerebral infarction without residual deficits: Secondary | ICD-10-CM | POA: Diagnosis not present

## 2019-11-14 DIAGNOSIS — Z8673 Personal history of transient ischemic attack (TIA), and cerebral infarction without residual deficits: Secondary | ICD-10-CM | POA: Diagnosis not present

## 2019-11-14 DIAGNOSIS — E43 Unspecified severe protein-calorie malnutrition: Secondary | ICD-10-CM | POA: Diagnosis not present

## 2019-11-17 DIAGNOSIS — Z8673 Personal history of transient ischemic attack (TIA), and cerebral infarction without residual deficits: Secondary | ICD-10-CM | POA: Diagnosis not present

## 2019-11-17 DIAGNOSIS — E43 Unspecified severe protein-calorie malnutrition: Secondary | ICD-10-CM | POA: Diagnosis not present

## 2019-11-18 DIAGNOSIS — Z8673 Personal history of transient ischemic attack (TIA), and cerebral infarction without residual deficits: Secondary | ICD-10-CM | POA: Diagnosis not present

## 2019-11-18 DIAGNOSIS — E43 Unspecified severe protein-calorie malnutrition: Secondary | ICD-10-CM | POA: Diagnosis not present

## 2019-11-19 DIAGNOSIS — Z8673 Personal history of transient ischemic attack (TIA), and cerebral infarction without residual deficits: Secondary | ICD-10-CM | POA: Diagnosis not present

## 2019-11-19 DIAGNOSIS — E43 Unspecified severe protein-calorie malnutrition: Secondary | ICD-10-CM | POA: Diagnosis not present

## 2019-11-20 DIAGNOSIS — Z8673 Personal history of transient ischemic attack (TIA), and cerebral infarction without residual deficits: Secondary | ICD-10-CM | POA: Diagnosis not present

## 2019-11-20 DIAGNOSIS — E43 Unspecified severe protein-calorie malnutrition: Secondary | ICD-10-CM | POA: Diagnosis not present

## 2019-11-21 DIAGNOSIS — Z8673 Personal history of transient ischemic attack (TIA), and cerebral infarction without residual deficits: Secondary | ICD-10-CM | POA: Diagnosis not present

## 2019-11-21 DIAGNOSIS — E43 Unspecified severe protein-calorie malnutrition: Secondary | ICD-10-CM | POA: Diagnosis not present

## 2019-11-25 DIAGNOSIS — R32 Unspecified urinary incontinence: Secondary | ICD-10-CM | POA: Diagnosis not present

## 2019-11-25 DIAGNOSIS — F0390 Unspecified dementia without behavioral disturbance: Secondary | ICD-10-CM | POA: Diagnosis not present

## 2019-12-15 DIAGNOSIS — Z8673 Personal history of transient ischemic attack (TIA), and cerebral infarction without residual deficits: Secondary | ICD-10-CM | POA: Diagnosis not present

## 2019-12-15 DIAGNOSIS — E43 Unspecified severe protein-calorie malnutrition: Secondary | ICD-10-CM | POA: Diagnosis not present

## 2019-12-16 DIAGNOSIS — E43 Unspecified severe protein-calorie malnutrition: Secondary | ICD-10-CM | POA: Diagnosis not present

## 2019-12-16 DIAGNOSIS — Z8673 Personal history of transient ischemic attack (TIA), and cerebral infarction without residual deficits: Secondary | ICD-10-CM | POA: Diagnosis not present

## 2019-12-17 DIAGNOSIS — E43 Unspecified severe protein-calorie malnutrition: Secondary | ICD-10-CM | POA: Diagnosis not present

## 2019-12-17 DIAGNOSIS — Z8673 Personal history of transient ischemic attack (TIA), and cerebral infarction without residual deficits: Secondary | ICD-10-CM | POA: Diagnosis not present

## 2019-12-18 DIAGNOSIS — E43 Unspecified severe protein-calorie malnutrition: Secondary | ICD-10-CM | POA: Diagnosis not present

## 2019-12-18 DIAGNOSIS — Z8673 Personal history of transient ischemic attack (TIA), and cerebral infarction without residual deficits: Secondary | ICD-10-CM | POA: Diagnosis not present

## 2019-12-19 DIAGNOSIS — Z8673 Personal history of transient ischemic attack (TIA), and cerebral infarction without residual deficits: Secondary | ICD-10-CM | POA: Diagnosis not present

## 2019-12-19 DIAGNOSIS — E43 Unspecified severe protein-calorie malnutrition: Secondary | ICD-10-CM | POA: Diagnosis not present

## 2019-12-22 DIAGNOSIS — Z8673 Personal history of transient ischemic attack (TIA), and cerebral infarction without residual deficits: Secondary | ICD-10-CM | POA: Diagnosis not present

## 2019-12-22 DIAGNOSIS — E43 Unspecified severe protein-calorie malnutrition: Secondary | ICD-10-CM | POA: Diagnosis not present

## 2019-12-23 DIAGNOSIS — Z8673 Personal history of transient ischemic attack (TIA), and cerebral infarction without residual deficits: Secondary | ICD-10-CM | POA: Diagnosis not present

## 2019-12-23 DIAGNOSIS — E43 Unspecified severe protein-calorie malnutrition: Secondary | ICD-10-CM | POA: Diagnosis not present

## 2019-12-24 DIAGNOSIS — Z8673 Personal history of transient ischemic attack (TIA), and cerebral infarction without residual deficits: Secondary | ICD-10-CM | POA: Diagnosis not present

## 2019-12-24 DIAGNOSIS — E43 Unspecified severe protein-calorie malnutrition: Secondary | ICD-10-CM | POA: Diagnosis not present

## 2019-12-25 DIAGNOSIS — E43 Unspecified severe protein-calorie malnutrition: Secondary | ICD-10-CM | POA: Diagnosis not present

## 2019-12-25 DIAGNOSIS — Z8673 Personal history of transient ischemic attack (TIA), and cerebral infarction without residual deficits: Secondary | ICD-10-CM | POA: Diagnosis not present

## 2019-12-26 DIAGNOSIS — Z8673 Personal history of transient ischemic attack (TIA), and cerebral infarction without residual deficits: Secondary | ICD-10-CM | POA: Diagnosis not present

## 2019-12-26 DIAGNOSIS — E43 Unspecified severe protein-calorie malnutrition: Secondary | ICD-10-CM | POA: Diagnosis not present

## 2019-12-29 ENCOUNTER — Telehealth: Payer: Self-pay

## 2019-12-29 DIAGNOSIS — E43 Unspecified severe protein-calorie malnutrition: Secondary | ICD-10-CM | POA: Diagnosis not present

## 2019-12-29 DIAGNOSIS — Z8673 Personal history of transient ischemic attack (TIA), and cerebral infarction without residual deficits: Secondary | ICD-10-CM | POA: Diagnosis not present

## 2019-12-29 NOTE — Telephone Encounter (Signed)
Please schedule for telehealth visit so this can be documented in an office note.

## 2019-12-29 NOTE — Telephone Encounter (Signed)
Patient's daughter was called and informed of televisit with PCP.

## 2019-12-29 NOTE — Telephone Encounter (Signed)
Patient needs office notes stating that she has dementia so that her incont.suppilies can be covered by insurance.

## 2019-12-30 DIAGNOSIS — F0390 Unspecified dementia without behavioral disturbance: Secondary | ICD-10-CM | POA: Diagnosis not present

## 2019-12-30 DIAGNOSIS — Z8673 Personal history of transient ischemic attack (TIA), and cerebral infarction without residual deficits: Secondary | ICD-10-CM | POA: Diagnosis not present

## 2019-12-30 DIAGNOSIS — R32 Unspecified urinary incontinence: Secondary | ICD-10-CM | POA: Diagnosis not present

## 2019-12-30 DIAGNOSIS — E43 Unspecified severe protein-calorie malnutrition: Secondary | ICD-10-CM | POA: Diagnosis not present

## 2019-12-31 DIAGNOSIS — E43 Unspecified severe protein-calorie malnutrition: Secondary | ICD-10-CM | POA: Diagnosis not present

## 2019-12-31 DIAGNOSIS — Z8673 Personal history of transient ischemic attack (TIA), and cerebral infarction without residual deficits: Secondary | ICD-10-CM | POA: Diagnosis not present

## 2020-01-01 DIAGNOSIS — Z8673 Personal history of transient ischemic attack (TIA), and cerebral infarction without residual deficits: Secondary | ICD-10-CM | POA: Diagnosis not present

## 2020-01-01 DIAGNOSIS — E43 Unspecified severe protein-calorie malnutrition: Secondary | ICD-10-CM | POA: Diagnosis not present

## 2020-01-02 DIAGNOSIS — E43 Unspecified severe protein-calorie malnutrition: Secondary | ICD-10-CM | POA: Diagnosis not present

## 2020-01-02 DIAGNOSIS — Z8673 Personal history of transient ischemic attack (TIA), and cerebral infarction without residual deficits: Secondary | ICD-10-CM | POA: Diagnosis not present

## 2020-01-05 DIAGNOSIS — Z8673 Personal history of transient ischemic attack (TIA), and cerebral infarction without residual deficits: Secondary | ICD-10-CM | POA: Diagnosis not present

## 2020-01-05 DIAGNOSIS — E43 Unspecified severe protein-calorie malnutrition: Secondary | ICD-10-CM | POA: Diagnosis not present

## 2020-01-06 DIAGNOSIS — Z8673 Personal history of transient ischemic attack (TIA), and cerebral infarction without residual deficits: Secondary | ICD-10-CM | POA: Diagnosis not present

## 2020-01-06 DIAGNOSIS — E43 Unspecified severe protein-calorie malnutrition: Secondary | ICD-10-CM | POA: Diagnosis not present

## 2020-01-07 DIAGNOSIS — Z8673 Personal history of transient ischemic attack (TIA), and cerebral infarction without residual deficits: Secondary | ICD-10-CM | POA: Diagnosis not present

## 2020-01-07 DIAGNOSIS — E43 Unspecified severe protein-calorie malnutrition: Secondary | ICD-10-CM | POA: Diagnosis not present

## 2020-01-08 ENCOUNTER — Ambulatory Visit: Payer: Medicaid Other | Attending: Family Medicine | Admitting: Family Medicine

## 2020-01-08 DIAGNOSIS — E43 Unspecified severe protein-calorie malnutrition: Secondary | ICD-10-CM | POA: Diagnosis not present

## 2020-01-08 DIAGNOSIS — Z8673 Personal history of transient ischemic attack (TIA), and cerebral infarction without residual deficits: Secondary | ICD-10-CM | POA: Diagnosis not present

## 2020-01-09 DIAGNOSIS — E43 Unspecified severe protein-calorie malnutrition: Secondary | ICD-10-CM | POA: Diagnosis not present

## 2020-01-09 DIAGNOSIS — Z8673 Personal history of transient ischemic attack (TIA), and cerebral infarction without residual deficits: Secondary | ICD-10-CM | POA: Diagnosis not present

## 2020-01-12 DIAGNOSIS — E43 Unspecified severe protein-calorie malnutrition: Secondary | ICD-10-CM | POA: Diagnosis not present

## 2020-01-12 DIAGNOSIS — Z8673 Personal history of transient ischemic attack (TIA), and cerebral infarction without residual deficits: Secondary | ICD-10-CM | POA: Diagnosis not present

## 2020-01-13 DIAGNOSIS — Z8673 Personal history of transient ischemic attack (TIA), and cerebral infarction without residual deficits: Secondary | ICD-10-CM | POA: Diagnosis not present

## 2020-01-13 DIAGNOSIS — E43 Unspecified severe protein-calorie malnutrition: Secondary | ICD-10-CM | POA: Diagnosis not present

## 2020-01-14 DIAGNOSIS — E43 Unspecified severe protein-calorie malnutrition: Secondary | ICD-10-CM | POA: Diagnosis not present

## 2020-01-14 DIAGNOSIS — Z8673 Personal history of transient ischemic attack (TIA), and cerebral infarction without residual deficits: Secondary | ICD-10-CM | POA: Diagnosis not present

## 2020-01-15 DIAGNOSIS — E43 Unspecified severe protein-calorie malnutrition: Secondary | ICD-10-CM | POA: Diagnosis not present

## 2020-01-15 DIAGNOSIS — Z8673 Personal history of transient ischemic attack (TIA), and cerebral infarction without residual deficits: Secondary | ICD-10-CM | POA: Diagnosis not present

## 2020-01-16 DIAGNOSIS — Z8673 Personal history of transient ischemic attack (TIA), and cerebral infarction without residual deficits: Secondary | ICD-10-CM | POA: Diagnosis not present

## 2020-01-16 DIAGNOSIS — E43 Unspecified severe protein-calorie malnutrition: Secondary | ICD-10-CM | POA: Diagnosis not present

## 2020-01-19 DIAGNOSIS — E43 Unspecified severe protein-calorie malnutrition: Secondary | ICD-10-CM | POA: Diagnosis not present

## 2020-01-19 DIAGNOSIS — Z8673 Personal history of transient ischemic attack (TIA), and cerebral infarction without residual deficits: Secondary | ICD-10-CM | POA: Diagnosis not present

## 2020-01-20 DIAGNOSIS — E43 Unspecified severe protein-calorie malnutrition: Secondary | ICD-10-CM | POA: Diagnosis not present

## 2020-01-20 DIAGNOSIS — Z8673 Personal history of transient ischemic attack (TIA), and cerebral infarction without residual deficits: Secondary | ICD-10-CM | POA: Diagnosis not present

## 2020-01-21 DIAGNOSIS — Z8673 Personal history of transient ischemic attack (TIA), and cerebral infarction without residual deficits: Secondary | ICD-10-CM | POA: Diagnosis not present

## 2020-01-21 DIAGNOSIS — E43 Unspecified severe protein-calorie malnutrition: Secondary | ICD-10-CM | POA: Diagnosis not present

## 2020-01-22 DIAGNOSIS — E43 Unspecified severe protein-calorie malnutrition: Secondary | ICD-10-CM | POA: Diagnosis not present

## 2020-01-22 DIAGNOSIS — Z8673 Personal history of transient ischemic attack (TIA), and cerebral infarction without residual deficits: Secondary | ICD-10-CM | POA: Diagnosis not present

## 2020-01-23 DIAGNOSIS — E43 Unspecified severe protein-calorie malnutrition: Secondary | ICD-10-CM | POA: Diagnosis not present

## 2020-01-23 DIAGNOSIS — Z8673 Personal history of transient ischemic attack (TIA), and cerebral infarction without residual deficits: Secondary | ICD-10-CM | POA: Diagnosis not present

## 2020-01-26 DIAGNOSIS — Z8673 Personal history of transient ischemic attack (TIA), and cerebral infarction without residual deficits: Secondary | ICD-10-CM | POA: Diagnosis not present

## 2020-01-26 DIAGNOSIS — E43 Unspecified severe protein-calorie malnutrition: Secondary | ICD-10-CM | POA: Diagnosis not present

## 2020-01-27 DIAGNOSIS — E43 Unspecified severe protein-calorie malnutrition: Secondary | ICD-10-CM | POA: Diagnosis not present

## 2020-01-27 DIAGNOSIS — Z8673 Personal history of transient ischemic attack (TIA), and cerebral infarction without residual deficits: Secondary | ICD-10-CM | POA: Diagnosis not present

## 2020-01-28 DIAGNOSIS — Z8673 Personal history of transient ischemic attack (TIA), and cerebral infarction without residual deficits: Secondary | ICD-10-CM | POA: Diagnosis not present

## 2020-01-28 DIAGNOSIS — E43 Unspecified severe protein-calorie malnutrition: Secondary | ICD-10-CM | POA: Diagnosis not present

## 2020-01-29 DIAGNOSIS — Z8673 Personal history of transient ischemic attack (TIA), and cerebral infarction without residual deficits: Secondary | ICD-10-CM | POA: Diagnosis not present

## 2020-01-29 DIAGNOSIS — E43 Unspecified severe protein-calorie malnutrition: Secondary | ICD-10-CM | POA: Diagnosis not present

## 2020-01-30 DIAGNOSIS — Z8673 Personal history of transient ischemic attack (TIA), and cerebral infarction without residual deficits: Secondary | ICD-10-CM | POA: Diagnosis not present

## 2020-01-30 DIAGNOSIS — E43 Unspecified severe protein-calorie malnutrition: Secondary | ICD-10-CM | POA: Diagnosis not present

## 2020-02-02 DIAGNOSIS — E43 Unspecified severe protein-calorie malnutrition: Secondary | ICD-10-CM | POA: Diagnosis not present

## 2020-02-02 DIAGNOSIS — Z8673 Personal history of transient ischemic attack (TIA), and cerebral infarction without residual deficits: Secondary | ICD-10-CM | POA: Diagnosis not present

## 2020-02-03 DIAGNOSIS — Z8673 Personal history of transient ischemic attack (TIA), and cerebral infarction without residual deficits: Secondary | ICD-10-CM | POA: Diagnosis not present

## 2020-02-03 DIAGNOSIS — E43 Unspecified severe protein-calorie malnutrition: Secondary | ICD-10-CM | POA: Diagnosis not present

## 2020-02-04 DIAGNOSIS — E43 Unspecified severe protein-calorie malnutrition: Secondary | ICD-10-CM | POA: Diagnosis not present

## 2020-02-04 DIAGNOSIS — Z8673 Personal history of transient ischemic attack (TIA), and cerebral infarction without residual deficits: Secondary | ICD-10-CM | POA: Diagnosis not present

## 2020-02-05 DIAGNOSIS — E43 Unspecified severe protein-calorie malnutrition: Secondary | ICD-10-CM | POA: Diagnosis not present

## 2020-02-05 DIAGNOSIS — Z8673 Personal history of transient ischemic attack (TIA), and cerebral infarction without residual deficits: Secondary | ICD-10-CM | POA: Diagnosis not present

## 2020-02-09 DIAGNOSIS — R32 Unspecified urinary incontinence: Secondary | ICD-10-CM | POA: Diagnosis not present

## 2020-02-09 DIAGNOSIS — F0390 Unspecified dementia without behavioral disturbance: Secondary | ICD-10-CM | POA: Diagnosis not present

## 2020-02-10 DIAGNOSIS — Z8673 Personal history of transient ischemic attack (TIA), and cerebral infarction without residual deficits: Secondary | ICD-10-CM | POA: Diagnosis not present

## 2020-02-10 DIAGNOSIS — E43 Unspecified severe protein-calorie malnutrition: Secondary | ICD-10-CM | POA: Diagnosis not present

## 2020-02-11 DIAGNOSIS — Z8673 Personal history of transient ischemic attack (TIA), and cerebral infarction without residual deficits: Secondary | ICD-10-CM | POA: Diagnosis not present

## 2020-02-11 DIAGNOSIS — E43 Unspecified severe protein-calorie malnutrition: Secondary | ICD-10-CM | POA: Diagnosis not present

## 2020-02-12 DIAGNOSIS — Z8673 Personal history of transient ischemic attack (TIA), and cerebral infarction without residual deficits: Secondary | ICD-10-CM | POA: Diagnosis not present

## 2020-02-12 DIAGNOSIS — E43 Unspecified severe protein-calorie malnutrition: Secondary | ICD-10-CM | POA: Diagnosis not present

## 2020-02-13 DIAGNOSIS — E43 Unspecified severe protein-calorie malnutrition: Secondary | ICD-10-CM | POA: Diagnosis not present

## 2020-02-13 DIAGNOSIS — Z8673 Personal history of transient ischemic attack (TIA), and cerebral infarction without residual deficits: Secondary | ICD-10-CM | POA: Diagnosis not present

## 2020-02-16 DIAGNOSIS — E43 Unspecified severe protein-calorie malnutrition: Secondary | ICD-10-CM | POA: Diagnosis not present

## 2020-02-16 DIAGNOSIS — Z8673 Personal history of transient ischemic attack (TIA), and cerebral infarction without residual deficits: Secondary | ICD-10-CM | POA: Diagnosis not present

## 2020-02-17 DIAGNOSIS — E43 Unspecified severe protein-calorie malnutrition: Secondary | ICD-10-CM | POA: Diagnosis not present

## 2020-02-17 DIAGNOSIS — Z8673 Personal history of transient ischemic attack (TIA), and cerebral infarction without residual deficits: Secondary | ICD-10-CM | POA: Diagnosis not present

## 2020-02-18 DIAGNOSIS — E43 Unspecified severe protein-calorie malnutrition: Secondary | ICD-10-CM | POA: Diagnosis not present

## 2020-02-18 DIAGNOSIS — Z8673 Personal history of transient ischemic attack (TIA), and cerebral infarction without residual deficits: Secondary | ICD-10-CM | POA: Diagnosis not present

## 2020-02-19 DIAGNOSIS — Z8673 Personal history of transient ischemic attack (TIA), and cerebral infarction without residual deficits: Secondary | ICD-10-CM | POA: Diagnosis not present

## 2020-02-19 DIAGNOSIS — E43 Unspecified severe protein-calorie malnutrition: Secondary | ICD-10-CM | POA: Diagnosis not present

## 2020-02-20 DIAGNOSIS — E43 Unspecified severe protein-calorie malnutrition: Secondary | ICD-10-CM | POA: Diagnosis not present

## 2020-02-20 DIAGNOSIS — Z8673 Personal history of transient ischemic attack (TIA), and cerebral infarction without residual deficits: Secondary | ICD-10-CM | POA: Diagnosis not present

## 2020-02-23 DIAGNOSIS — Z8673 Personal history of transient ischemic attack (TIA), and cerebral infarction without residual deficits: Secondary | ICD-10-CM | POA: Diagnosis not present

## 2020-02-23 DIAGNOSIS — E43 Unspecified severe protein-calorie malnutrition: Secondary | ICD-10-CM | POA: Diagnosis not present

## 2020-02-24 DIAGNOSIS — Z8673 Personal history of transient ischemic attack (TIA), and cerebral infarction without residual deficits: Secondary | ICD-10-CM | POA: Diagnosis not present

## 2020-02-24 DIAGNOSIS — E43 Unspecified severe protein-calorie malnutrition: Secondary | ICD-10-CM | POA: Diagnosis not present

## 2020-02-26 DIAGNOSIS — E43 Unspecified severe protein-calorie malnutrition: Secondary | ICD-10-CM | POA: Diagnosis not present

## 2020-02-26 DIAGNOSIS — Z8673 Personal history of transient ischemic attack (TIA), and cerebral infarction without residual deficits: Secondary | ICD-10-CM | POA: Diagnosis not present

## 2020-02-27 DIAGNOSIS — E43 Unspecified severe protein-calorie malnutrition: Secondary | ICD-10-CM | POA: Diagnosis not present

## 2020-02-27 DIAGNOSIS — Z8673 Personal history of transient ischemic attack (TIA), and cerebral infarction without residual deficits: Secondary | ICD-10-CM | POA: Diagnosis not present

## 2020-03-01 DIAGNOSIS — E43 Unspecified severe protein-calorie malnutrition: Secondary | ICD-10-CM | POA: Diagnosis not present

## 2020-03-01 DIAGNOSIS — Z8673 Personal history of transient ischemic attack (TIA), and cerebral infarction without residual deficits: Secondary | ICD-10-CM | POA: Diagnosis not present

## 2020-03-02 DIAGNOSIS — Z8673 Personal history of transient ischemic attack (TIA), and cerebral infarction without residual deficits: Secondary | ICD-10-CM | POA: Diagnosis not present

## 2020-03-02 DIAGNOSIS — E43 Unspecified severe protein-calorie malnutrition: Secondary | ICD-10-CM | POA: Diagnosis not present

## 2020-03-03 DIAGNOSIS — R32 Unspecified urinary incontinence: Secondary | ICD-10-CM | POA: Diagnosis not present

## 2020-03-03 DIAGNOSIS — E43 Unspecified severe protein-calorie malnutrition: Secondary | ICD-10-CM | POA: Diagnosis not present

## 2020-03-03 DIAGNOSIS — F0390 Unspecified dementia without behavioral disturbance: Secondary | ICD-10-CM | POA: Diagnosis not present

## 2020-03-03 DIAGNOSIS — Z8673 Personal history of transient ischemic attack (TIA), and cerebral infarction without residual deficits: Secondary | ICD-10-CM | POA: Diagnosis not present

## 2020-03-04 DIAGNOSIS — Z8673 Personal history of transient ischemic attack (TIA), and cerebral infarction without residual deficits: Secondary | ICD-10-CM | POA: Diagnosis not present

## 2020-03-04 DIAGNOSIS — E43 Unspecified severe protein-calorie malnutrition: Secondary | ICD-10-CM | POA: Diagnosis not present

## 2020-03-05 DIAGNOSIS — Z8673 Personal history of transient ischemic attack (TIA), and cerebral infarction without residual deficits: Secondary | ICD-10-CM | POA: Diagnosis not present

## 2020-03-05 DIAGNOSIS — E43 Unspecified severe protein-calorie malnutrition: Secondary | ICD-10-CM | POA: Diagnosis not present

## 2020-03-08 DIAGNOSIS — Z8673 Personal history of transient ischemic attack (TIA), and cerebral infarction without residual deficits: Secondary | ICD-10-CM | POA: Diagnosis not present

## 2020-03-08 DIAGNOSIS — E43 Unspecified severe protein-calorie malnutrition: Secondary | ICD-10-CM | POA: Diagnosis not present

## 2020-03-09 DIAGNOSIS — Z8673 Personal history of transient ischemic attack (TIA), and cerebral infarction without residual deficits: Secondary | ICD-10-CM | POA: Diagnosis not present

## 2020-03-09 DIAGNOSIS — E43 Unspecified severe protein-calorie malnutrition: Secondary | ICD-10-CM | POA: Diagnosis not present

## 2020-03-10 DIAGNOSIS — E43 Unspecified severe protein-calorie malnutrition: Secondary | ICD-10-CM | POA: Diagnosis not present

## 2020-03-10 DIAGNOSIS — Z8673 Personal history of transient ischemic attack (TIA), and cerebral infarction without residual deficits: Secondary | ICD-10-CM | POA: Diagnosis not present

## 2020-03-11 DIAGNOSIS — Z8673 Personal history of transient ischemic attack (TIA), and cerebral infarction without residual deficits: Secondary | ICD-10-CM | POA: Diagnosis not present

## 2020-03-11 DIAGNOSIS — E43 Unspecified severe protein-calorie malnutrition: Secondary | ICD-10-CM | POA: Diagnosis not present

## 2020-03-12 DIAGNOSIS — Z8673 Personal history of transient ischemic attack (TIA), and cerebral infarction without residual deficits: Secondary | ICD-10-CM | POA: Diagnosis not present

## 2020-03-12 DIAGNOSIS — E43 Unspecified severe protein-calorie malnutrition: Secondary | ICD-10-CM | POA: Diagnosis not present

## 2020-03-15 DIAGNOSIS — Z8673 Personal history of transient ischemic attack (TIA), and cerebral infarction without residual deficits: Secondary | ICD-10-CM | POA: Diagnosis not present

## 2020-03-15 DIAGNOSIS — E43 Unspecified severe protein-calorie malnutrition: Secondary | ICD-10-CM | POA: Diagnosis not present

## 2020-03-16 DIAGNOSIS — E43 Unspecified severe protein-calorie malnutrition: Secondary | ICD-10-CM | POA: Diagnosis not present

## 2020-03-16 DIAGNOSIS — Z8673 Personal history of transient ischemic attack (TIA), and cerebral infarction without residual deficits: Secondary | ICD-10-CM | POA: Diagnosis not present

## 2020-03-17 DIAGNOSIS — Z8673 Personal history of transient ischemic attack (TIA), and cerebral infarction without residual deficits: Secondary | ICD-10-CM | POA: Diagnosis not present

## 2020-03-17 DIAGNOSIS — E43 Unspecified severe protein-calorie malnutrition: Secondary | ICD-10-CM | POA: Diagnosis not present

## 2020-03-18 DIAGNOSIS — Z8673 Personal history of transient ischemic attack (TIA), and cerebral infarction without residual deficits: Secondary | ICD-10-CM | POA: Diagnosis not present

## 2020-03-18 DIAGNOSIS — E43 Unspecified severe protein-calorie malnutrition: Secondary | ICD-10-CM | POA: Diagnosis not present

## 2020-03-19 DIAGNOSIS — Z8673 Personal history of transient ischemic attack (TIA), and cerebral infarction without residual deficits: Secondary | ICD-10-CM | POA: Diagnosis not present

## 2020-03-19 DIAGNOSIS — E43 Unspecified severe protein-calorie malnutrition: Secondary | ICD-10-CM | POA: Diagnosis not present

## 2020-03-22 DIAGNOSIS — R69 Illness, unspecified: Secondary | ICD-10-CM | POA: Diagnosis not present

## 2020-03-23 DIAGNOSIS — R69 Illness, unspecified: Secondary | ICD-10-CM | POA: Diagnosis not present

## 2020-03-24 DIAGNOSIS — R69 Illness, unspecified: Secondary | ICD-10-CM | POA: Diagnosis not present

## 2020-03-25 DIAGNOSIS — R69 Illness, unspecified: Secondary | ICD-10-CM | POA: Diagnosis not present

## 2020-03-26 DIAGNOSIS — R69 Illness, unspecified: Secondary | ICD-10-CM | POA: Diagnosis not present

## 2020-03-29 DIAGNOSIS — R69 Illness, unspecified: Secondary | ICD-10-CM | POA: Diagnosis not present

## 2020-03-30 DIAGNOSIS — R69 Illness, unspecified: Secondary | ICD-10-CM | POA: Diagnosis not present

## 2020-03-31 DIAGNOSIS — R69 Illness, unspecified: Secondary | ICD-10-CM | POA: Diagnosis not present

## 2020-04-01 DIAGNOSIS — R69 Illness, unspecified: Secondary | ICD-10-CM | POA: Diagnosis not present

## 2020-04-02 DIAGNOSIS — R69 Illness, unspecified: Secondary | ICD-10-CM | POA: Diagnosis not present

## 2020-04-05 DIAGNOSIS — R69 Illness, unspecified: Secondary | ICD-10-CM | POA: Diagnosis not present

## 2020-04-06 DIAGNOSIS — R69 Illness, unspecified: Secondary | ICD-10-CM | POA: Diagnosis not present

## 2020-04-07 DIAGNOSIS — R69 Illness, unspecified: Secondary | ICD-10-CM | POA: Diagnosis not present

## 2020-04-08 DIAGNOSIS — R69 Illness, unspecified: Secondary | ICD-10-CM | POA: Diagnosis not present

## 2020-04-09 DIAGNOSIS — R69 Illness, unspecified: Secondary | ICD-10-CM | POA: Diagnosis not present

## 2020-04-12 DIAGNOSIS — R69 Illness, unspecified: Secondary | ICD-10-CM | POA: Diagnosis not present

## 2020-04-13 DIAGNOSIS — R69 Illness, unspecified: Secondary | ICD-10-CM | POA: Diagnosis not present

## 2020-04-14 DIAGNOSIS — R69 Illness, unspecified: Secondary | ICD-10-CM | POA: Diagnosis not present

## 2020-04-15 DIAGNOSIS — R69 Illness, unspecified: Secondary | ICD-10-CM | POA: Diagnosis not present

## 2020-04-16 DIAGNOSIS — R69 Illness, unspecified: Secondary | ICD-10-CM | POA: Diagnosis not present

## 2020-04-19 DIAGNOSIS — R69 Illness, unspecified: Secondary | ICD-10-CM | POA: Diagnosis not present

## 2020-04-20 DIAGNOSIS — R69 Illness, unspecified: Secondary | ICD-10-CM | POA: Diagnosis not present

## 2020-04-21 DIAGNOSIS — R69 Illness, unspecified: Secondary | ICD-10-CM | POA: Diagnosis not present

## 2020-04-22 DIAGNOSIS — R69 Illness, unspecified: Secondary | ICD-10-CM | POA: Diagnosis not present

## 2020-04-23 DIAGNOSIS — R69 Illness, unspecified: Secondary | ICD-10-CM | POA: Diagnosis not present

## 2020-04-25 DIAGNOSIS — R32 Unspecified urinary incontinence: Secondary | ICD-10-CM | POA: Diagnosis not present

## 2020-04-25 DIAGNOSIS — F0390 Unspecified dementia without behavioral disturbance: Secondary | ICD-10-CM | POA: Diagnosis not present

## 2020-04-26 DIAGNOSIS — R69 Illness, unspecified: Secondary | ICD-10-CM | POA: Diagnosis not present

## 2020-04-27 DIAGNOSIS — R69 Illness, unspecified: Secondary | ICD-10-CM | POA: Diagnosis not present

## 2020-04-28 DIAGNOSIS — R69 Illness, unspecified: Secondary | ICD-10-CM | POA: Diagnosis not present

## 2020-04-29 DIAGNOSIS — R69 Illness, unspecified: Secondary | ICD-10-CM | POA: Diagnosis not present

## 2020-04-30 DIAGNOSIS — R69 Illness, unspecified: Secondary | ICD-10-CM | POA: Diagnosis not present

## 2020-05-03 DIAGNOSIS — R69 Illness, unspecified: Secondary | ICD-10-CM | POA: Diagnosis not present

## 2020-05-04 DIAGNOSIS — R69 Illness, unspecified: Secondary | ICD-10-CM | POA: Diagnosis not present

## 2020-05-05 DIAGNOSIS — R69 Illness, unspecified: Secondary | ICD-10-CM | POA: Diagnosis not present

## 2020-05-06 DIAGNOSIS — R69 Illness, unspecified: Secondary | ICD-10-CM | POA: Diagnosis not present

## 2020-05-07 DIAGNOSIS — R69 Illness, unspecified: Secondary | ICD-10-CM | POA: Diagnosis not present

## 2020-05-10 DIAGNOSIS — R69 Illness, unspecified: Secondary | ICD-10-CM | POA: Diagnosis not present

## 2020-05-11 DIAGNOSIS — R69 Illness, unspecified: Secondary | ICD-10-CM | POA: Diagnosis not present

## 2020-05-12 DIAGNOSIS — R69 Illness, unspecified: Secondary | ICD-10-CM | POA: Diagnosis not present

## 2020-05-13 DIAGNOSIS — R69 Illness, unspecified: Secondary | ICD-10-CM | POA: Diagnosis not present

## 2020-05-14 DIAGNOSIS — R69 Illness, unspecified: Secondary | ICD-10-CM | POA: Diagnosis not present

## 2020-05-17 DIAGNOSIS — R69 Illness, unspecified: Secondary | ICD-10-CM | POA: Diagnosis not present

## 2020-05-19 DIAGNOSIS — R69 Illness, unspecified: Secondary | ICD-10-CM | POA: Diagnosis not present

## 2020-05-20 DIAGNOSIS — R69 Illness, unspecified: Secondary | ICD-10-CM | POA: Diagnosis not present

## 2020-05-21 ENCOUNTER — Emergency Department (HOSPITAL_COMMUNITY): Payer: Medicaid Other

## 2020-05-21 ENCOUNTER — Encounter (HOSPITAL_COMMUNITY): Payer: Self-pay

## 2020-05-21 ENCOUNTER — Other Ambulatory Visit: Payer: Self-pay

## 2020-05-21 ENCOUNTER — Emergency Department (HOSPITAL_COMMUNITY)
Admission: EM | Admit: 2020-05-21 | Discharge: 2020-05-21 | Disposition: A | Discharge status: Home / Self Care | Payer: Medicaid Other | Attending: Emergency Medicine | Admitting: Emergency Medicine

## 2020-05-21 DIAGNOSIS — Z79899 Other long term (current) drug therapy: Secondary | ICD-10-CM | POA: Diagnosis not present

## 2020-05-21 DIAGNOSIS — I10 Essential (primary) hypertension: Secondary | ICD-10-CM | POA: Diagnosis not present

## 2020-05-21 DIAGNOSIS — R69 Illness, unspecified: Secondary | ICD-10-CM | POA: Diagnosis not present

## 2020-05-21 DIAGNOSIS — R040 Epistaxis: Secondary | ICD-10-CM | POA: Diagnosis not present

## 2020-05-21 DIAGNOSIS — Z8673 Personal history of transient ischemic attack (TIA), and cerebral infarction without residual deficits: Secondary | ICD-10-CM | POA: Diagnosis not present

## 2020-05-21 DIAGNOSIS — R609 Edema, unspecified: Secondary | ICD-10-CM | POA: Diagnosis not present

## 2020-05-21 DIAGNOSIS — R509 Fever, unspecified: Secondary | ICD-10-CM | POA: Diagnosis present

## 2020-05-21 DIAGNOSIS — U071 COVID-19: Secondary | ICD-10-CM | POA: Insufficient documentation

## 2020-05-21 LAB — CBC
HCT: 36.4 % (ref 36.0–46.0)
Hemoglobin: 10.7 g/dL — ABNORMAL LOW (ref 12.0–15.0)
MCH: 21.4 pg — ABNORMAL LOW (ref 26.0–34.0)
MCHC: 29.4 g/dL — ABNORMAL LOW (ref 30.0–36.0)
MCV: 72.9 fL — ABNORMAL LOW (ref 80.0–100.0)
Platelets: 166 10*3/uL (ref 150–400)
RBC: 4.99 MIL/uL (ref 3.87–5.11)
RDW: 18.1 % — ABNORMAL HIGH (ref 11.5–15.5)
WBC: 5.2 10*3/uL (ref 4.0–10.5)
nRBC: 0 % (ref 0.0–0.2)

## 2020-05-21 LAB — TROPONIN I (HIGH SENSITIVITY): Troponin I (High Sensitivity): 7 ng/L (ref <18)

## 2020-05-21 LAB — URINALYSIS, ROUTINE W REFLEX MICROSCOPIC
Bacteria, UA: NONE SEEN
Bilirubin Urine: NEGATIVE
Glucose, UA: NEGATIVE mg/dL
Ketones, ur: NEGATIVE mg/dL
Leukocytes,Ua: NEGATIVE
Nitrite: NEGATIVE
Protein, ur: 30 mg/dL — AB
Specific Gravity, Urine: 1.016 (ref 1.005–1.030)
pH: 5 (ref 5.0–8.0)

## 2020-05-21 LAB — BASIC METABOLIC PANEL WITH GFR
Anion gap: 11 (ref 5–15)
BUN: 15 mg/dL (ref 6–20)
CO2: 26 mmol/L (ref 22–32)
Calcium: 9.3 mg/dL (ref 8.9–10.3)
Chloride: 104 mmol/L (ref 98–111)
Creatinine, Ser: 1.28 mg/dL — ABNORMAL HIGH (ref 0.44–1.00)
GFR, Estimated: 48 mL/min — ABNORMAL LOW
Glucose, Bld: 145 mg/dL — ABNORMAL HIGH (ref 70–99)
Potassium: 3.8 mmol/L (ref 3.5–5.1)
Sodium: 141 mmol/L (ref 135–145)

## 2020-05-21 LAB — RESP PANEL BY RT-PCR (FLU A&B, COVID) ARPGX2
Influenza A by PCR: NEGATIVE
Influenza B by PCR: NEGATIVE
SARS Coronavirus 2 by RT PCR: POSITIVE — AB

## 2020-05-21 LAB — BRAIN NATRIURETIC PEPTIDE: B Natriuretic Peptide: 41.5 pg/mL (ref 0.0–100.0)

## 2020-05-21 MED ORDER — FAMOTIDINE 20 MG IN NS 100 ML IVPB: 20.0000 mg | Freq: Once | INTRAVENOUS | Status: DC | PRN

## 2020-05-21 MED ORDER — EPINEPHRINE 0.3 MG/0.3ML IJ SOAJ: 0.3000 mg | Freq: Once | INTRAMUSCULAR | Status: DC | PRN

## 2020-05-21 MED ORDER — METHYLPREDNISOLONE SODIUM SUCC 125 MG IJ SOLR: 125.0000 mg | Freq: Once | INTRAMUSCULAR | Status: DC | PRN

## 2020-05-21 MED ORDER — SODIUM CHLORIDE 0.9 % IV SOLN: 1200.0000 mg | Freq: Once | INTRAVENOUS | Status: DC

## 2020-05-21 MED ORDER — AMLODIPINE BESYLATE 5 MG PO TABS
10.0000 mg | ORAL_TABLET | Freq: Once | ORAL | Status: AC
  Administered 2020-05-21: 10 mg via ORAL
  Filled 2020-05-21: qty 2

## 2020-05-21 MED ORDER — DIPHENHYDRAMINE HCL 50 MG/ML IJ SOLN: 50.0000 mg | Freq: Once | INTRAMUSCULAR | Status: DC | PRN

## 2020-05-21 MED ORDER — ALBUTEROL SULFATE HFA 108 (90 BASE) MCG/ACT IN AERS: 2.0000 | INHALATION_SPRAY | Freq: Once | RESPIRATORY_TRACT | Status: DC | PRN

## 2020-05-21 MED ORDER — SODIUM CHLORIDE 0.9 % IV SOLN
1200.0000 mg | Freq: Once | INTRAVENOUS | Status: AC
  Administered 2020-05-21: 1200 mg via INTRAVENOUS
  Filled 2020-05-21: qty 10

## 2020-05-21 MED ORDER — CARVEDILOL 3.125 MG PO TABS
3.1250 mg | ORAL_TABLET | Freq: Two times a day (BID) | ORAL | Status: DC
  Administered 2020-05-21: 3.125 mg via ORAL
  Filled 2020-05-21: qty 1

## 2020-05-21 MED ORDER — SODIUM CHLORIDE 0.9 % IV SOLN: INTRAVENOUS | Status: DC | PRN

## 2020-05-21 NOTE — ED Triage Notes (Signed)
Pt states that her nose was bleeding earlier and she thinks her BP may have been high, no bleeding now, pt also c/o of urinary frequency

## 2020-05-21 NOTE — ED Provider Notes (Signed)
[facility] EMERGENCY DEPARTMENT Provider Note  CSN: [id number] Arrival date & time: 05/21/20 0000  Chief Complaint(s) Hypertension  HPI Erica Lopez is a 59 y.o. 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  Epistaxis has resolved.  Patient is unable to provide detailed history.  I spoke with the patient's daughter who reports that the patient is at her baseline mental status. The patient's medication is managed by her and the home nurse.  States that she has not missed any doses.  Reports that her blood pressure usually runs in the 130s.  Last time it was checked was several weeks ago.  Additionally she reports that the patient has been having 5 days of bilateral lower extremity edema to the ankles.  Also reports of the patient's urine has looked dark and had his foul smell to it.  She denies any known fevers.  No emesis or diarrhea.  Remainder of history, ROS, and physical exam limited due to patient's condition (cognitive impairment from stroke). Additional information was obtained from daughter.   Level V Caveat.    HPI  Past Medical History Past Medical History:  Diagnosis Date  . Hypertension   . Stroke [facility]) 2014   Patient Active Problem List   Diagnosis Date Noted  . Incontinence of urine 05/27/2018  . Vascular dementia (HCC) 04/23/2018  . Protein calorie malnutrition (HCC) 08/10/2016  . History of stroke 07/13/2016  . Tinea pedis 07/13/2016  . Underweight 07/13/2016  . Hypertension 07/13/2016   Home Medication(s) Prior to Admission medications   Medication Sig Start Date End Date Taking? Authorizing Provider  amLODipine (NORVASC) 10 MG tablet Take 1 tablet (10 mg total) by mouth daily. 06/30/19   Hoy Register, MD  carvedilol (COREG) 3.125 MG tablet Take 1  tablet (3.125 mg total) by mouth 2 (two) times daily with a meal. 06/30/19   Hoy Register, MD  mirtazapine (REMERON) 30 MG tablet Take 1 tablet (30 mg total) by mouth at bedtime. 04/23/18   Hoy Register, MD                                                                                                                                    Past Surgical History History reviewed. No pertinent surgical history. Family History Family History  Problem Relation Age of Onset  . Cancer Other   . Hypertension Other   . Stroke Other   . Heart disease Other   . Diabetes Other     Social History Social History   Tobacco Use  . Smoking status: Never Smoker  . Smokeless tobacco: Never Used  Substance Use Topics  . Alcohol use: No  . Drug use: No   Allergies Aspirin, Lisinopril, and Other  Review of Systems Review of  Systems  Unable to perform ROS: Other    Physical Exam Vital Signs  I have reviewed the triage vital signs BP (!) 185/113   Pulse 81   Temp 98.1 F (36.7 C) (Oral)   Resp (!) 22   SpO2 100%   Physical Exam Vitals reviewed.  Constitutional:      General: She is not in acute distress.    Appearance: She is well-developed and well-nourished. She is not diaphoretic.  HENT:     Head: Normocephalic and atraumatic.     Nose: Nose normal.     Right Nostril: No epistaxis.     Left Nostril: No epistaxis.     Comments: Dried blood in both nares. Eyes:     General: No scleral icterus.       Right eye: No discharge.        Left eye: No discharge.     Extraocular Movements: EOM normal.     Conjunctiva/sclera: Conjunctivae normal.     Pupils: Pupils are equal, round, and reactive to light.  Cardiovascular:     Rate and Rhythm: Normal rate and regular rhythm.     Heart sounds: No murmur heard. No friction rub. No gallop.   Pulmonary:     Effort: Pulmonary effort is normal. No respiratory distress.     Breath sounds: Normal breath sounds. No stridor. No rales.   Abdominal:     General: There is no distension.     Palpations: Abdomen is soft.     Tenderness: There is no abdominal tenderness.  Musculoskeletal:        General: No tenderness.     Cervical back: Normal range of motion and neck supple.     Right lower leg: 1+ Pitting Edema present.     Left lower leg: 1+ Pitting Edema present.  Skin:    General: Skin is warm and dry.     Findings: No erythema or rash.  Neurological:     Mental Status: She is alert.     Comments: Oriented to person, place, and year/season   Psychiatric:        Mood and Affect: Mood and affect normal.     ED Results and Treatments Labs (all labs ordered are listed, but only abnormal results are displayed) Labs Reviewed  RESP PANEL BY RT-PCR (FLU A&B, COVID) ARPGX2 - Abnormal; Notable for the following components:      Result Value   SARS Coronavirus 2 by RT PCR POSITIVE (*)    All other components within normal limits  URINALYSIS, ROUTINE W REFLEX MICROSCOPIC - Abnormal; Notable for the following components:   APPearance HAZY (*)    Hgb urine dipstick SMALL (*)    Protein, ur 30 (*)    All other components within normal limits  CBC - Abnormal; Notable for the following components:   Hemoglobin 10.7 (*)    MCV 72.9 (*)    MCH 21.4 (*)    MCHC 29.4 (*)    RDW 18.1 (*)    All other components within normal limits  BASIC METABOLIC PANEL - Abnormal; Notable for the following components:   Glucose, Bld 145 (*)    Creatinine, Ser 1.28 (*)    GFR, Estimated 48 (*)    All other components within normal limits  BRAIN NATRIURETIC PEPTIDE  TROPONIN I (HIGH SENSITIVITY)  EKG  EKG Interpretation  Date/Time:  Friday May 21 2020 06:52:09 EST Ventricular Rate:  76 PR Interval:    QRS Duration: 89 QT Interval:  366 QTC Calculation: 412 R Axis:   36 Text Interpretation: Sinus rhythm  Borderline prolonged PR interval Probable anteroseptal infarct, old No acute changes Confirmed by Drema Pry [phone]) on 05/21/2020 7:16:50 AM      Radiology DG Chest Port 1 View  Result Date: 05/21/2020 CLINICAL DATA:  COVID positive, hypertension EXAM: PORTABLE CHEST 1 VIEW COMPARISON:  01/05/2017 FINDINGS: Lungs are clear. No pneumothorax or pleural effusion. Cardiac size is at the upper limits of normal. Pulmonary vascularity is normal. No acute bone abnormality. IMPRESSION: No active disease. Electronically Signed   By: Helyn Numbers MD   On: 05/21/2020 06:53    Pertinent labs & imaging results that were available during my care of the patient were reviewed by me and considered in my medical decision making (see chart for details).  Medications Ordered in ED Medications  0.9 %  sodium chloride infusion (has no administration in time range)  diphenhydrAMINE (BENADRYL) injection 50 mg (has no administration in time range)  famotidine (PEPCID) IVPB 20 mg in NS 100 mL IVPB (has no administration in time range)  methylPREDNISolone sodium succinate (SOLU-MEDROL) 125 mg/2 mL injection 125 mg (has no administration in time range)  albuterol (VENTOLIN HFA) 108 (90 Base) MCG/ACT inhaler 2 puff (has no administration in time range)  EPINEPHrine (EPI-PEN) injection 0.3 mg (has no administration in time range)  casirivimab-imdevimab (REGEN-COV) 1,200 mg in sodium chloride 0.9 % 110 mL IVPB (has no administration in time range)  carvedilol (COREG) tablet 3.125 mg (has no administration in time range)  amLODipine (NORVASC) tablet 10 mg (10 mg Oral Given 05/21/20 6010)                                                                                                                                    Procedures Procedures  (including critical care time)  Medical Decision Making / ED Course I have reviewed the nursing notes for this encounter and the patient's prior records (if available in EHR  or on provided paperwork).   Amyah Clawson was evaluated in Emergency Department on 05/21/2020 for the symptoms described in the history of present illness. She was evaluated in the context of the global COVID-19 pandemic, which necessitated consideration that the patient might be at risk for infection with the SARS-CoV-2 virus that causes COVID-19. Institutional protocols and algorithms that pertain to the evaluation of patients at risk for COVID-19 are in a state of rapid change based on information released by regulatory bodies including the CDC and federal and state organizations. These policies and algorithms were followed during the patient's care in the ED.  1. Epistaxis. Resolved.  2. HTN daughter reports no missed doses. She is hypertensive with systolics in the one eighties. Given an extra dose of her  amlodipine and coreg. Labs notable for close to baseline renal function. Given the patient's bilateral lower extremity edema, will assess for evidence of new heart failure. EKG without acute ischemic changes. BnP and troponin ordered. Chest x-ray without evidence of cardiomegaly or pulmonary edema  3. Fever patient noted to be febrile in triage daughter was unaware. UA without evidence of infection. Patient is Covid positive. Chest x-ray as above without evidence of pneumonia. Patient defervesced with Tylenol. She meets criteria for monoclonal antibody in the emergency department.  Discussed with daughter who is ameable to the patient receiving this.   Patient care turned over to Dr Anitra LauthPlunkett. Patient case and results discussed in detail; please see their note for further ED managment. If her work up is concerning for HF, she will require admission. I anticipate that her will be reassuring and she would be able to be discharged, thus MAB was ordered to expedite her dispo.      Final Clinical Impression(s) / ED Diagnoses Final diagnoses:  HTN (hypertension)  COVID-19 virus  infection  Nosebleed      This chart was dictated using voice recognition software.  Despite best efforts to proofread,  errors can occur which can change the documentation meaning.   Nira Connardama, Amera Banos Eduardo, MD 05/21/20 [phone]

## 2020-05-21 NOTE — ED Provider Notes (Signed)
Patient completed Mab infusion without incident.  After morning blood pressure medication blood pressure is now 157/96.  Findings discussed with patient's daughter.  Troponin and BNP without acute findings.  Did discuss following up with PCP for possible blood pressure medication adjustment if pressure remains elevated even with home meds.   Gwyneth Sprout, MD 05/21/20 [phone]

## 2020-05-22 DIAGNOSIS — R32 Unspecified urinary incontinence: Secondary | ICD-10-CM | POA: Diagnosis not present

## 2020-05-22 DIAGNOSIS — F0390 Unspecified dementia without behavioral disturbance: Secondary | ICD-10-CM | POA: Diagnosis not present

## 2020-05-24 ENCOUNTER — Telehealth: Payer: Self-pay

## 2020-05-24 NOTE — Telephone Encounter (Signed)
Transition Care Management Unsuccessful Follow-up Telephone Call  Date of discharge and from where:  05/21/2020  Attempts:  1st Attempt  Reason for unsuccessful TCM follow-up call:  Left voice message

## 2020-05-25 NOTE — Telephone Encounter (Signed)
Transition Care Management Unsuccessful Follow-up Telephone Call  Date of discharge and from where:  05/21/2020 from [facility]  Attempts:  2nd Attempt  Reason for unsuccessful TCM follow-up call:  Unable to leave message

## 2020-05-25 NOTE — Telephone Encounter (Signed)
Pt dtr called back and requested that we call pt around noon time tomorrow.

## 2020-05-27 ENCOUNTER — Other Ambulatory Visit: Payer: Self-pay | Admitting: Family Medicine

## 2020-05-27 DIAGNOSIS — I10 Essential (primary) hypertension: Secondary | ICD-10-CM

## 2020-05-27 MED ORDER — AMLODIPINE BESYLATE 10 MG PO TABS: 10.0000 mg | ORAL_TABLET | Freq: Every day | ORAL | 0 refills | Status: DC

## 2020-05-27 NOTE — Telephone Encounter (Signed)
Pt request refill  amLODipine (NORVASC) 10 MG tablet  [id number] - [place] ([place]), [place] - [address] Phone:  [phone]  Fax:  [phone]     Pt did not call pharmacy because there were no more refill.  Advised pt ok to call pharmacy next time

## 2020-05-27 NOTE — Telephone Encounter (Signed)
Patient has appointment with Georgian Co, PA on 06/17/2020 at 3:30pm.

## 2020-06-04 ENCOUNTER — Telehealth: Payer: Self-pay | Admitting: Family Medicine

## 2020-06-04 NOTE — Telephone Encounter (Signed)
Received [facility] [facility] LTSS 3051 form to be completed. Places form in PCP box.

## 2020-06-07 NOTE — Telephone Encounter (Signed)
Paperwork will be completed and pt will be called when ready.

## 2020-06-08 DIAGNOSIS — R69 Illness, unspecified: Secondary | ICD-10-CM | POA: Diagnosis not present

## 2020-06-09 DIAGNOSIS — R69 Illness, unspecified: Secondary | ICD-10-CM | POA: Diagnosis not present

## 2020-06-10 DIAGNOSIS — R69 Illness, unspecified: Secondary | ICD-10-CM | POA: Diagnosis not present

## 2020-06-11 DIAGNOSIS — R69 Illness, unspecified: Secondary | ICD-10-CM | POA: Diagnosis not present

## 2020-06-14 DIAGNOSIS — R69 Illness, unspecified: Secondary | ICD-10-CM | POA: Diagnosis not present

## 2020-06-15 DIAGNOSIS — R69 Illness, unspecified: Secondary | ICD-10-CM | POA: Diagnosis not present

## 2020-06-16 DIAGNOSIS — R69 Illness, unspecified: Secondary | ICD-10-CM | POA: Diagnosis not present

## 2020-06-17 ENCOUNTER — Ambulatory Visit: Payer: Medicaid Other | Attending: Physician Assistant | Admitting: Physician Assistant

## 2020-06-17 ENCOUNTER — Other Ambulatory Visit: Payer: Self-pay

## 2020-06-17 ENCOUNTER — Encounter: Payer: Self-pay | Admitting: Physician Assistant

## 2020-06-17 VITALS — BP 150/84 | HR 62 | Ht 63.0 in | Wt 98.0 lb

## 2020-06-17 DIAGNOSIS — Z09 Encounter for follow-up examination after completed treatment for conditions other than malignant neoplasm: Secondary | ICD-10-CM | POA: Diagnosis not present

## 2020-06-17 DIAGNOSIS — R739 Hyperglycemia, unspecified: Secondary | ICD-10-CM | POA: Diagnosis not present

## 2020-06-17 DIAGNOSIS — U071 COVID-19: Secondary | ICD-10-CM | POA: Diagnosis not present

## 2020-06-17 DIAGNOSIS — D649 Anemia, unspecified: Secondary | ICD-10-CM | POA: Diagnosis not present

## 2020-06-17 DIAGNOSIS — R69 Illness, unspecified: Secondary | ICD-10-CM | POA: Diagnosis not present

## 2020-06-17 DIAGNOSIS — F0150 Vascular dementia without behavioral disturbance: Secondary | ICD-10-CM | POA: Diagnosis not present

## 2020-06-17 DIAGNOSIS — I10 Essential (primary) hypertension: Secondary | ICD-10-CM | POA: Diagnosis not present

## 2020-06-17 MED ORDER — CARVEDILOL 3.125 MG PO TABS: 3.1250 mg | ORAL_TABLET | Freq: Two times a day (BID) | ORAL | 1 refills | Status: DC

## 2020-06-17 MED ORDER — AMLODIPINE BESYLATE 10 MG PO TABS: 10.0000 mg | ORAL_TABLET | Freq: Every day | ORAL | 3 refills | Status: DC

## 2020-06-17 NOTE — Progress Notes (Signed)
Erica Lopez, is a 60 y.o. female  TKZ:[id number]  TDD:[id number]  DOB - December 19, 1960  Subjective:  Chief Complaint and HPI: Erica Lopez is a 60 y.o. female here today  for a follow up visit after being seen in the ED for nose bleeds and inadvertently diagnosed with Covid 05/21/2020.  She is feeling great.  Her daughter is with her.  No more nose bleeds.  Appetite is good.  No fevers.  BP at home is 120-130/70-80 on current regimen.  She never developed symptoms with Covid.  Her daughter is with her.  She has an aid that helps her at home   Erica Lopez is a 60 y.o. 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  Epistaxis has resolved.  Patient is unable to provide detailed history.  I spoke with the patient's daughter who reports that the patient is at her baseline mental status. The patient's medication is managed by her and the home nurse.  States that she has not missed any doses.  Reports that her blood pressure usually runs in the 130s.  Last time it was checked was several weeks ago.  Additionally she reports that the patient has been having 5 days of bilateral lower extremity edema to the ankles.  Also reports of the patient's urine has looked dark and had his foul smell to it.   Erica Lopez was evaluated in Emergency Department on 05/21/2020 for the symptoms described in the history of present illness. She was evaluated in the context of the global COVID-19 pandemic, which necessitated consideration that the patient might be at risk for infection with the SARS-CoV-2 virus that causes COVID-19. Institutional protocols and algorithms that pertain to the evaluation of patients at risk for COVID-19 are in a state of rapid change based on information released by regulatory bodies including the CDC and federal  and state organizations. These policies and algorithms were followed during the patient's care in the ED.  1. Epistaxis. Resolved.  2. HTN daughter reports no missed doses. She is hypertensive with systolics in the one eighties. Given an extra dose of her amlodipine and coreg. Labs notable for close to baseline renal function. Given the patient's bilateral lower extremity edema, will assess for evidence of new heart failure. EKG without acute ischemic changes. BnP and troponin ordered. Chest x-ray without evidence of cardiomegaly or pulmonary edema  3. Fever patient noted to be febrile in triage daughter was unaware. UA without evidence of infection. Patient is Covid positive. Chest x-ray as above without evidence of pneumonia. Patient defervesced with Tylenol. She meets criteria for monoclonal antibody in the emergency department.  Discussed with daughter who is ameable to the patient receiving this.   Patient care turned over to Dr Anitra Lauth. Patient case and results discussed in detail; please see their note for further ED managment. If her work up is concerning for HF, she will require admission. I anticipate that her will be reassuring and she would be able to be discharged, thus MAB was ordered to expedite her dispo.   ED/Hospital notes reviewed.    ROS:   Constitutional:  No f/c, No night sweats, No unexplained weight loss. EENT:  No vision changes, No blurry vision, No hearing changes. No mouth, throat, or ear problems.  Respiratory: No cough, No SOB Cardiac: No CP, no palpitations GI:  No abd pain, No N/V/D. GU: No Urinary s/sx Musculoskeletal: No joint pain Neuro: post stroke 2014 baseline without changes Skin: No rash Endocrine:  No polydipsia. No polyuria.  Psych: Denies SI/HI  No problems updated.  ALLERGIES: Allergies  Allergen Reactions  . Aspirin Anaphylaxis and Other (See Comments)    Bleeding event  . Lisinopril Swelling  . Other Hives, Swelling  and Other (See Comments)    Blueberries, causing hives and swelling    PAST MEDICAL HISTORY: Past Medical History:  Diagnosis Date  . Hypertension   . Stroke [facility]) 2014    MEDICATIONS AT HOME: Prior to Admission medications   Medication Sig Start Date End Date Taking? Authorizing Provider  mirtazapine (REMERON) 30 MG tablet Take 1 tablet (30 mg total) by mouth at bedtime. 04/23/18  Yes Hoy Register, MD  amLODipine (NORVASC) 10 MG tablet Take 1 tablet (10 mg total) by mouth daily. 06/17/20   Anders Simmonds, PA-C  carvedilol (COREG) 3.125 MG tablet Take 1 tablet (3.125 mg total) by mouth 2 (two) times daily with a meal. 06/17/20   Hamzah Savoca, Marzella Schlein, PA-C     Objective:  EXAM:   Vitals:   06/17/20 1535  BP: (!) 150/84  Pulse: 62  Weight: 98 lb (44.5 kg)  Height: 5\' 3"  (1.6 m)    General appearance : A&OX3. NAD. Non-toxic-appearing HEENT: Atraumatic and Normocephalic.  PERRLA. EOM intact.   Chest/Lungs:  Breathing-non-labored, Good air entry bilaterally, breath sounds normal without rales, rhonchi, or wheezing  CVS: S1 S2 regular, no murmurs, gallops, rubs  Abdomen: Bowel sounds present, Non tender and not distended with no gaurding, rigidity or rebound. Extremities: Bilateral Lower Ext shows no edema, both legs are warm to touch with = pulse throughout Neurology:  Most of history through patient's daughter that is with her today Psych:  TP linear. J/I WNL. Normal speech. Appropriate eye contact and affect.  Skin:  No Rash  Data Review No results found for: HGBA1C   Assessment & Plan   1. Essential hypertension Controlled based on home readings-continue - carvedilol (COREG) 3.125 MG tablet; Take 1 tablet (3.125 mg total) by mouth 2 (two) times daily with a meal.  Dispense: 180 tablet; Refill: 1 - amLODipine (NORVASC) 10 MG tablet; Take 1 tablet (10 mg total) by mouth daily.  Dispense: 30 tablet; Refill: 3 - Comprehensive metabolic panel - CBC with  Differential/Platelet  2. Vascular dementia without behavioral disturbance (HCC) No changes  3. Hyperglycemia I have had a lengthy discussion and provided education about insulin resistance and the intake of too much sugar/refined carbohydrates.  I have advised the patient to work at a goal of eliminating sugary drinks, candy, desserts, sweets, refined sugars, processed foods, and white carbohydrates.  The patient expresses understanding.  - Hemoglobin A1c  4. COVID-19 Resolved/never had symptoms  5. Encounter for examination following treatment at hospital Doing well/back to baseline  6. Anemia, unspecified type - CBC with Differential/Platelet   Patient have been counseled extensively about nutrition and exercise  Return in about 3 months (around 09/15/2020).  The patient was given clear instructions to go to ER or return to medical center if symptoms don't improve, worsen or new problems develop. The patient verbalized understanding. The patient was told to call to get lab results if they haven't heard anything in the next week.     09/17/2020, PA-C [facility] [place], [place]   06/17/2020, 4:10 PM

## 2020-06-18 DIAGNOSIS — R69 Illness, unspecified: Secondary | ICD-10-CM | POA: Diagnosis not present

## 2020-06-18 LAB — COMPREHENSIVE METABOLIC PANEL
ALT: 28 IU/L (ref 0–32)
AST: 23 IU/L (ref 0–40)
Albumin/Globulin Ratio: 1.3 (ref 1.2–2.2)
Albumin: 4.4 g/dL (ref 3.8–4.9)
Alkaline Phosphatase: 79 IU/L (ref 44–121)
BUN/Creatinine Ratio: 17 (ref 12–28)
BUN: 22 mg/dL (ref 8–27)
Bilirubin Total: 0.2 mg/dL (ref 0.0–1.2)
CO2: 26 mmol/L (ref 20–29)
Calcium: 9.4 mg/dL (ref 8.7–10.3)
Chloride: 105 mmol/L (ref 96–106)
Creatinine, Ser: 1.26 mg/dL — ABNORMAL HIGH (ref 0.57–1.00)
GFR calc Af Amer: 54 mL/min/{1.73_m2} — ABNORMAL LOW (ref >59)
GFR calc non Af Amer: 46 mL/min/{1.73_m2} — ABNORMAL LOW (ref >59)
Globulin, Total: 3.4 g/dL (ref 1.5–4.5)
Glucose: 114 mg/dL — ABNORMAL HIGH (ref 65–99)
Potassium: 3.5 mmol/L (ref 3.5–5.2)
Sodium: 144 mmol/L (ref 134–144)
Total Protein: 7.8 g/dL (ref 6.0–8.5)

## 2020-06-18 LAB — CBC WITH DIFFERENTIAL/PLATELET
Basophils Absolute: 0 10*3/uL (ref 0.0–0.2)
Basos: 1 %
EOS (ABSOLUTE): 0 10*3/uL (ref 0.0–0.4)
Eos: 1 %
Hematocrit: 34.9 % (ref 34.0–46.6)
Hemoglobin: 10.5 g/dL — ABNORMAL LOW (ref 11.1–15.9)
Immature Grans (Abs): 0 10*3/uL (ref 0.0–0.1)
Immature Granulocytes: 0 %
Lymphocytes Absolute: 0.9 10*3/uL (ref 0.7–3.1)
Lymphs: 21 %
MCH: 21.8 pg — ABNORMAL LOW (ref 26.6–33.0)
MCHC: 30.1 g/dL — ABNORMAL LOW (ref 31.5–35.7)
MCV: 72 fL — ABNORMAL LOW (ref 79–97)
Monocytes Absolute: 0.4 10*3/uL (ref 0.1–0.9)
Monocytes: 9 %
Neutrophils Absolute: 3 10*3/uL (ref 1.4–7.0)
Neutrophils: 68 %
Platelets: 241 10*3/uL (ref 150–450)
RBC: 4.82 x10E6/uL (ref 3.77–5.28)
RDW: 18.9 % — ABNORMAL HIGH (ref 11.7–15.4)
WBC: 4.4 10*3/uL (ref 3.4–10.8)

## 2020-06-18 LAB — HEMOGLOBIN A1C
Est. average glucose Bld gHb Est-mCnc: 123 mg/dL
Hgb A1c MFr Bld: 5.9 % — ABNORMAL HIGH (ref 4.8–5.6)

## 2020-06-21 DIAGNOSIS — R69 Illness, unspecified: Secondary | ICD-10-CM | POA: Diagnosis not present

## 2020-06-22 DIAGNOSIS — R32 Unspecified urinary incontinence: Secondary | ICD-10-CM | POA: Diagnosis not present

## 2020-06-22 DIAGNOSIS — F0390 Unspecified dementia without behavioral disturbance: Secondary | ICD-10-CM | POA: Diagnosis not present

## 2020-06-23 DIAGNOSIS — F0390 Unspecified dementia without behavioral disturbance: Secondary | ICD-10-CM | POA: Diagnosis not present

## 2020-06-24 DIAGNOSIS — F0390 Unspecified dementia without behavioral disturbance: Secondary | ICD-10-CM | POA: Diagnosis not present

## 2020-06-25 DIAGNOSIS — F0390 Unspecified dementia without behavioral disturbance: Secondary | ICD-10-CM | POA: Diagnosis not present

## 2020-06-28 ENCOUNTER — Encounter: Payer: Self-pay | Admitting: *Deleted

## 2020-06-28 DIAGNOSIS — F0390 Unspecified dementia without behavioral disturbance: Secondary | ICD-10-CM | POA: Diagnosis not present

## 2020-06-29 DIAGNOSIS — F0390 Unspecified dementia without behavioral disturbance: Secondary | ICD-10-CM | POA: Diagnosis not present

## 2020-06-30 DIAGNOSIS — F0390 Unspecified dementia without behavioral disturbance: Secondary | ICD-10-CM | POA: Diagnosis not present

## 2020-07-01 DIAGNOSIS — F0390 Unspecified dementia without behavioral disturbance: Secondary | ICD-10-CM | POA: Diagnosis not present

## 2020-07-02 ENCOUNTER — Telehealth: Payer: Self-pay

## 2020-07-02 DIAGNOSIS — F0390 Unspecified dementia without behavioral disturbance: Secondary | ICD-10-CM | POA: Diagnosis not present

## 2020-07-02 NOTE — Telephone Encounter (Signed)
Pt's daughter on [place] Maxon given lab results per notes of Georgian Co, [place] on 06/23/20. Pt's daughter verbalized understanding.     Anders Simmonds, PA-C  06/23/2020 1:32 PM EST      Please call patient. Blood sugars in the prediabetes range. Eat less sugar, juices, and processed foods. Kidney function and blood count are about the same and stable. Follow up as planned. Thanks, Georgian Co, PA-C

## 2020-07-05 DIAGNOSIS — F0390 Unspecified dementia without behavioral disturbance: Secondary | ICD-10-CM | POA: Diagnosis not present

## 2020-07-06 DIAGNOSIS — F0390 Unspecified dementia without behavioral disturbance: Secondary | ICD-10-CM | POA: Diagnosis not present

## 2020-07-07 ENCOUNTER — Emergency Department (HOSPITAL_COMMUNITY)
Admission: EM | Admit: 2020-07-07 | Discharge: 2020-07-08 | Disposition: A | Discharge status: Home / Self Care | Payer: Medicaid Other | Attending: Emergency Medicine | Admitting: Emergency Medicine

## 2020-07-07 DIAGNOSIS — I10 Essential (primary) hypertension: Secondary | ICD-10-CM | POA: Insufficient documentation

## 2020-07-07 DIAGNOSIS — T50901A Poisoning by unspecified drugs, medicaments and biological substances, accidental (unintentional), initial encounter: Secondary | ICD-10-CM | POA: Insufficient documentation

## 2020-07-07 DIAGNOSIS — Z79899 Other long term (current) drug therapy: Secondary | ICD-10-CM | POA: Insufficient documentation

## 2020-07-07 DIAGNOSIS — F0390 Unspecified dementia without behavioral disturbance: Secondary | ICD-10-CM | POA: Diagnosis not present

## 2020-07-07 DIAGNOSIS — E1165 Type 2 diabetes mellitus with hyperglycemia: Secondary | ICD-10-CM | POA: Diagnosis not present

## 2020-07-07 DIAGNOSIS — R110 Nausea: Secondary | ICD-10-CM | POA: Diagnosis not present

## 2020-07-07 DIAGNOSIS — T6591XA Toxic effect of unspecified substance, accidental (unintentional), initial encounter: Secondary | ICD-10-CM

## 2020-07-07 DIAGNOSIS — F0150 Vascular dementia without behavioral disturbance: Secondary | ICD-10-CM | POA: Insufficient documentation

## 2020-07-07 DIAGNOSIS — I959 Hypotension, unspecified: Secondary | ICD-10-CM | POA: Diagnosis not present

## 2020-07-07 LAB — CBC WITH DIFFERENTIAL/PLATELET
Abs Immature Granulocytes: 0.06 10*3/uL (ref 0.00–0.07)
Basophils Absolute: 0 10*3/uL (ref 0.0–0.1)
Basophils Relative: 1 %
Eosinophils Absolute: 0 10*3/uL (ref 0.0–0.5)
Eosinophils Relative: 1 %
HCT: 38.3 % (ref 36.0–46.0)
Hemoglobin: 10.9 g/dL — ABNORMAL LOW (ref 12.0–15.0)
Immature Granulocytes: 1 %
Lymphocytes Relative: 12 %
Lymphs Abs: 1.1 10*3/uL (ref 0.7–4.0)
MCH: 21.3 pg — ABNORMAL LOW (ref 26.0–34.0)
MCHC: 28.5 g/dL — ABNORMAL LOW (ref 30.0–36.0)
MCV: 74.8 fL — ABNORMAL LOW (ref 80.0–100.0)
Monocytes Absolute: 0.5 10*3/uL (ref 0.1–1.0)
Monocytes Relative: 5 %
Neutro Abs: 7 10*3/uL (ref 1.7–7.7)
Neutrophils Relative %: 80 %
Platelets: 231 10*3/uL (ref 150–400)
RBC: 5.12 MIL/uL — ABNORMAL HIGH (ref 3.87–5.11)
RDW: 18.7 % — ABNORMAL HIGH (ref 11.5–15.5)
WBC: 8.7 10*3/uL (ref 4.0–10.5)
nRBC: 0 % (ref 0.0–0.2)

## 2020-07-07 NOTE — ED Provider Notes (Addendum)
[facility]   CSN: [id number] Arrival date & time: 07/07/20  2229     History No chief complaint on file.   Theotis BurrowMiriam Hendryx is a 60 y.o. female with PMH of HTN and CVA with persistent focal deficits including patient being nonverbal who presents the ED via EMS after toxic ingestion.  I obtained history from EMS reports that she drank Mr. Clean with Gain Moonlight Breeze Scent, mistaking it for grape soda.  Evidently she only had 1 large gulp before going upstairs and vomiting in the toilet.    On EMS arrival, she was noted to be mildly hypotensive, but has responded well to 500 cc IV NS.  Vital signs now stable and within normal limits and patient is in no acute distress.  On my examination, patient is denying any complaints at this time.  Specifically, patient denies any recent illness or infection, headache, numbness or weakness, chest pain, abdominal discomfort, current nausea symptoms, incontinence or dysuria, blurred vision, or other focal neurologic deficits.  Level 5 caveat due to patient being nonverbal.  HPI     Past Medical History:  Diagnosis Date  . Hypertension   . Stroke [facility](HCC) 2014    Patient Active Problem List   Diagnosis Date Noted  . Incontinence of urine 05/27/2018  . Vascular dementia (HCC) 04/23/2018  . Protein calorie malnutrition (HCC) 08/10/2016  . History of stroke 07/13/2016  . Tinea pedis 07/13/2016  . Underweight 07/13/2016  . Hypertension 07/13/2016    No past surgical history on file.   OB History   No obstetric history on file.     Family History  Problem Relation Age of Onset  . Cancer Other   . Hypertension Other   . Stroke Other   . Heart disease Other   . Diabetes Other     Social History   Tobacco Use  . Smoking status: Never Smoker  . Smokeless tobacco: Never Used  Substance Use Topics  . Alcohol use: No  . Drug use: No    Home Medications Prior to Admission  medications   Medication Sig Start Date End Date Taking? Authorizing Provider  amLODipine (NORVASC) 10 MG tablet Take 1 tablet (10 mg total) by mouth daily. 06/17/20  Yes Georgian CoMcClung, Angela M, PA-C  carvedilol (COREG) 3.125 MG tablet Take 1 tablet (3.125 mg total) by mouth 2 (two) times daily with a meal. 06/17/20  Yes McClung, Angela M, PA-C    Allergies    Aspirin, Lisinopril, and Other  Review of Systems   Review of Systems  All other systems reviewed and are negative.   Physical Exam Updated Vital Signs BP 125/82   Pulse 71   Temp (!) 96.5 F (35.8 C) (Rectal)   Resp 15   SpO2 100%   Physical Exam Vitals and nursing note reviewed. Exam conducted with a chaperone present.  Constitutional:      Comments: Patient can mumble words, but very limited.  HENT:     Head: Normocephalic and atraumatic.  Eyes:     General: No scleral icterus.    Extraocular Movements: Extraocular movements intact.     Conjunctiva/sclera: Conjunctivae normal.     Pupils: Pupils are equal, round, and reactive to light.  Cardiovascular:     Rate and Rhythm: Normal rate and regular rhythm.     Pulses: Normal pulses.     Heart sounds: Normal heart sounds.  Pulmonary:     Effort: Pulmonary effort is normal.  No respiratory distress.     Breath sounds: Normal breath sounds. No wheezing or rales.  Abdominal:     General: Abdomen is flat. There is no distension.     Palpations: Abdomen is soft.     Tenderness: There is no abdominal tenderness.  Musculoskeletal:        General: Normal range of motion.     Cervical back: Normal range of motion.     Right lower leg: No edema.     Left lower leg: No edema.     Comments: Moving all extremities.  Sensation grossly intact and symmetric throughout.    Skin:    General: Skin is dry.  Neurological:     General: No focal deficit present.     Mental Status: She is alert and oriented to person, place, and time.     GCS: GCS eye subscore is 4. GCS verbal subscore  is 5. GCS motor subscore is 6.     Cranial Nerves: No cranial nerve deficit.     Sensory: No sensory deficit.  Psychiatric:        Mood and Affect: Mood normal.        Behavior: Behavior normal.        Thought Content: Thought content normal.     ED Results / Procedures / Treatments   Labs (all labs ordered are listed, but only abnormal results are displayed) Labs Reviewed  CBC WITH DIFFERENTIAL/PLATELET - Abnormal; Notable for the following components:      Result Value   RBC 5.12 (*)    Hemoglobin 10.9 (*)    MCV 74.8 (*)    MCH 21.3 (*)    MCHC 28.5 (*)    RDW 18.7 (*)    All other components within normal limits  COMPREHENSIVE METABOLIC PANEL - Abnormal; Notable for the following components:   CO2 21 (*)    Glucose, Bld 212 (*)    BUN 23 (*)    Creatinine, Ser 1.37 (*)    Calcium 8.7 (*)    AST 47 (*)    GFR, Estimated 44 (*)    All other components within normal limits  URINALYSIS, ROUTINE W REFLEX MICROSCOPIC - Abnormal; Notable for the following components:   Color, Urine COLORLESS (*)    All other components within normal limits  SALICYLATE LEVEL - Abnormal; Notable for the following components:   Salicylate Lvl <7.0 (*)    All other components within normal limits  ACETAMINOPHEN LEVEL - Abnormal; Notable for the following components:   Acetaminophen (Tylenol), Serum <10 (*)    All other components within normal limits  RAPID URINE DRUG SCREEN, HOSP PERFORMED  ETHANOL    EKG EKG Interpretation  Date/Time:  Wednesday July 07 2020 22:37:56 EST Ventricular Rate:  80 PR Interval:    QRS Duration: 91 QT Interval:  375 QTC Calculation: 433 R Axis:   21 Text Interpretation: Sinus rhythm Probable anteroseptal infarct, old Artifact in lead(s) I II III aVR aVL aVF V1 V2 V3 V5 V6 No significant change since last tracing Confirmed by Gwyneth Sprout ([id number]) on 07/07/2020 10:52:58 PM   Radiology No results found.  Procedures Procedures   Medications  Ordered in ED Medications - No data to display  ED Course  I have reviewed the triage vital signs and the nursing notes.  Pertinent labs & imaging results that were available during my care of the patient were reviewed by me and considered in my medical decision making (see chart for  details).  Clinical Course as of 07/08/20 7867  Wed Jul 07, 2020  2247 I spoke with Patty from [employer] who had spoken with family and advised her to come to the ED for evaluation.  She states that the Mr. Clean is simply an irritant and is noncaustic.  No specific labs need to be obtained.  May cause N/V/D.  She states that the only reason why she advised that she come to the ED is because family reported that she seemed to be altered with ataxic gait.  She suspects that this may have been due to an underlying pathology which is why she advised them to come to the hospital.  She states that otherwise she would have encouraged them to stay home because there is nothing in particular needed with regard to her ingestion. [GG]    Clinical Course User Index [GG] Lorelee New, PA-C   MDM Rules/Calculators/A&P                          Lelani Garnett was evaluated in Emergency Department on 07/08/2020 for the symptoms described in the history of present illness. She was evaluated in the context of the global COVID-19 pandemic, which necessitated consideration that the patient might be at risk for infection with the SARS-CoV-2 virus that causes COVID-19. Institutional protocols and algorithms that pertain to the evaluation of patients at risk for COVID-19 are in a state of rapid change based on information released by regulatory bodies including the CDC and federal and state organizations. These policies and algorithms were followed during the patient's care in the ED.  I personally reviewed patient's medical chart and all notes from triage and staff during today's encounter. I have also ordered and reviewed all labs  and imaging that I felt to be medically necessary in the evaluation of this patient's complaints and with consideration of their physical exam. If needed, translation services were available and utilized.   I spoke with Patty from [employer] who had spoken with family and advised her to come to the ED for evaluation.  She states that the Mr. Clean is simply an irritant and is noncaustic.  No specific labs need to be obtained.  May cause N/V/D.  She states that the only reason why she advised that she come to the ED is because family reported that she seemed to be altered with ataxic gait.  She suspects that this may have been due to an underlying pathology which is why she advised them to come to the hospital.  She states that otherwise she would have encouraged them to stay home because there is nothing in particular needed with regard to her ingestion.  I obtained history from patient's daughter, Dirk Dress, who reports that patient has an at home nurse 5 days a week due to her resultant focal deficits.  She has a history of urinary and fecal incontinence.  She ambulates without any assistive devices, but moves particularly slow and has urge incontinence.  Evidently she was told earlier this week that she is prediabetic and had been advised to cut back on sugars.  Daughter suspects that she saw the bottle of Mr. Brigid Re and thought that it was her typical grape soda.  She also states that she recently was COVID-19 positive a few weeks ago and has diminished taste due to the virus and due to her CVA.  Daughter is confident that this is not due to depression or was  intentional.  She states that she was at her typical baseline prior to ingestion.  She states that she already looks much improved now in the room and then prior to EMS arrival at their home.  Daughter suspects that she is now back to her usual baseline, she was just concerned given EMS report of hypotension initially on the scene.  Patient's vital  signs have been stable and reassuring here in the ED.  She is denying any complaints.  Neuro exam is unremarkable.  Patient at baseline.  If laboratory work-up is unremarkable and patient is ambulatory, stable for discharge and outpatient follow-up.  Patient denies any depression or suicidal ideation.  She appears to have good mental capacity and is answering all of my questions appropriately (albeit with difficulty speaking).    EKG without significant changes when compared to prior tracings.  CBC with anemia consistent with baseline.  No leukocytosis concerning for infection.  UA without evidence of infection.  Remainder laboratory work-up also unremarkable.  Patient was ambulatory here in the ED and at her baseline.  Daughter is reassured.  ED return precautions discussed.  Patient voiced understanding and is agreeable to plan.   Final Clinical Impression(s) / ED Diagnoses Final diagnoses:  Accidental ingestion of substance, initial encounter    Rx / DC Orders ED Discharge Orders    None       Lorelee New, PA-C 07/08/20 0218    Lorelee New, PA-C 07/08/20 [id number]    Zadie Rhine, MD 07/08/20 [id number]

## 2020-07-07 NOTE — ED Provider Notes (Incomplete)
[facility]   CSN: [id number] Arrival date & time: 07/07/20  2229     History No chief complaint on file.   Erica BurrowMiriam Lopez is a 60 y.o. female with PMH of HTN and CVA with persistent focal deficits including patient being nonverbal who presents the ED via EMS after toxic ingestion.  I obtained history from EMS reports that she drank Mr. Clean with Gain Moonlight Breeze Scent, mistaking it for grape soda.  Evidently she only had 1 large gulp before going upstairs and vomiting in the toilet.    On EMS arrival, she was noted to be mildly hypotensive, but has responded well to 500 cc IV NS.  Vital signs now stable and within normal limits and patient is in no acute distress.  On my examination, patient is denying any complaints at this time.  Specifically, patient denies any recent illness or infection, headache, numbness or weakness, chest pain, abdominal discomfort, current nausea symptoms, incontinence or dysuria, blurred vision, or other focal neurologic deficits.  Level 5 caveat due to patient being nonverbal.  HPI     Past Medical History:  Diagnosis Date  . Hypertension   . Stroke [facility](HCC) 2014    Patient Active Problem List   Diagnosis Date Noted  . Incontinence of urine 05/27/2018  . Vascular dementia (HCC) 04/23/2018  . Protein calorie malnutrition (HCC) 08/10/2016  . History of stroke 07/13/2016  . Tinea pedis 07/13/2016  . Underweight 07/13/2016  . Hypertension 07/13/2016    No past surgical history on file.   OB History   No obstetric history on file.     Family History  Problem Relation Age of Onset  . Cancer Other   . Hypertension Other   . Stroke Other   . Heart disease Other   . Diabetes Other     Social History   Tobacco Use  . Smoking status: Never Smoker  . Smokeless tobacco: Never Used  Substance Use Topics  . Alcohol use: No  . Drug use: No    Home Medications Prior to Admission  medications   Medication Sig Start Date End Date Taking? Authorizing Provider  amLODipine (NORVASC) 10 MG tablet Take 1 tablet (10 mg total) by mouth daily. 06/17/20   Anders SimmondsMcClung, Angela M, PA-C  carvedilol (COREG) 3.125 MG tablet Take 1 tablet (3.125 mg total) by mouth 2 (two) times daily with a meal. 06/17/20   McClung, Marzella SchleinAngela M, PA-C  mirtazapine (REMERON) 30 MG tablet Take 1 tablet (30 mg total) by mouth at bedtime. 04/23/18   Hoy RegisterNewlin, Enobong, MD    Allergies    Aspirin, Lisinopril, and Other  Review of Systems   Review of Systems  All other systems reviewed and are negative.   Physical Exam Updated Vital Signs BP (!) 146/95 (BP Location: Right Arm)   Pulse 89   Resp 17   SpO2 100%   Physical Exam Vitals and nursing note reviewed. Exam conducted with a chaperone present.  Constitutional:      Comments: Patient can mumble words, but very limited.  HENT:     Head: Normocephalic and atraumatic.  Eyes:     General: No scleral icterus.    Extraocular Movements: Extraocular movements intact.     Conjunctiva/sclera: Conjunctivae normal.     Pupils: Pupils are equal, round, and reactive to light.  Cardiovascular:     Rate and Rhythm: Normal rate and regular rhythm.     Pulses: Normal pulses.  Heart sounds: Normal heart sounds.  Pulmonary:     Effort: Pulmonary effort is normal. No respiratory distress.     Breath sounds: Normal breath sounds. No wheezing or rales.  Abdominal:     General: Abdomen is flat. There is no distension.     Palpations: Abdomen is soft.     Tenderness: There is no abdominal tenderness.  Musculoskeletal:        General: Normal range of motion.     Cervical back: Normal range of motion.     Right lower leg: No edema.     Left lower leg: No edema.     Comments: Moving all extremities.  Sensation grossly intact and symmetric throughout.    Skin:    General: Skin is dry.  Neurological:     General: No focal deficit present.     Mental Status: She is  alert and oriented to person, place, and time.     GCS: GCS eye subscore is 4. GCS verbal subscore is 5. GCS motor subscore is 6.     Cranial Nerves: No cranial nerve deficit.     Sensory: No sensory deficit.  Psychiatric:        Mood and Affect: Mood normal.        Behavior: Behavior normal.        Thought Content: Thought content normal.     ED Results / Procedures / Treatments   Labs (all labs ordered are listed, but only abnormal results are displayed) Labs Reviewed  CBC WITH DIFFERENTIAL/PLATELET - Abnormal; Notable for the following components:      Result Value   RBC 5.12 (*)    Hemoglobin 10.9 (*)    MCV 74.8 (*)    MCH 21.3 (*)    MCHC 28.5 (*)    RDW 18.7 (*)    All other components within normal limits  COMPREHENSIVE METABOLIC PANEL  URINALYSIS, ROUTINE W REFLEX MICROSCOPIC  SALICYLATE LEVEL  ACETAMINOPHEN LEVEL  RAPID URINE DRUG SCREEN, HOSP PERFORMED  ETHANOL    EKG EKG Interpretation  Date/Time:  Wednesday July 07 2020 22:37:56 EST Ventricular Rate:  80 PR Interval:    QRS Duration: 91 QT Interval:  375 QTC Calculation: 433 R Axis:   21 Text Interpretation: Sinus rhythm Probable anteroseptal infarct, old Artifact in lead(s) I II III aVR aVL aVF V1 V2 V3 V5 V6 No significant change since last tracing Confirmed by Gwyneth Sprout ([id number]) on 07/07/2020 10:52:58 PM   Radiology No results found.  Procedures Procedures {Remember to document critical care time when appropriate:1}  Medications Ordered in ED Medications - No data to display  ED Course  I have reviewed the triage vital signs and the nursing notes.  Pertinent labs & imaging results that were available during my care of the patient were reviewed by me and considered in my medical decision making (see chart for details).  Clinical Course as of 07/07/20 2329  Wed Jul 07, 2020  2247 I spoke with Patty from [employer] who had spoken with family and advised her to come to the ED for  evaluation.  She states that the Mr. Clean is simply an irritant and is noncaustic.  No specific labs need to be obtained.  May cause N/V/D.  She states that the only reason why she advised that she come to the ED is because family reported that she seemed to be altered with ataxic gait.  She suspects that this may have been due to an underlying  pathology which is why she advised them to come to the hospital.  She states that otherwise she would have encouraged them to stay home because there is nothing in particular needed with regard to her ingestion. [GG]    Clinical Course User Index [GG] Lorelee New, PA-C   MDM Rules/Calculators/A&P                          Erica Lopez was evaluated in Emergency Department on 07/07/2020 for the symptoms described in the history of present illness. She was evaluated in the context of the global COVID-19 pandemic, which necessitated consideration that the patient might be at risk for infection with the SARS-CoV-2 virus that causes COVID-19. Institutional protocols and algorithms that pertain to the evaluation of patients at risk for COVID-19 are in a state of rapid change based on information released by regulatory bodies including the CDC and federal and state organizations. These policies and algorithms were followed during the patient's care in the ED.  I personally reviewed patient's medical chart and all notes from triage and staff during today's encounter. I have also ordered and reviewed all labs and imaging that I felt to be medically necessary in the evaluation of this patient's complaints and with consideration of their physical exam. If needed, translation services were available and utilized.   I spoke with Patty from [employer] who had spoken with family and advised her to come to the ED for evaluation.  She states that the Mr. Clean is simply an irritant and is noncaustic.  No specific labs need to be obtained.  May cause N/V/D.  She states that  the only reason why she advised that she come to the ED is because family reported that she seemed to be altered with ataxic gait.  She suspects that this may have been due to an underlying pathology which is why she advised them to come to the hospital.  She states that otherwise she would have encouraged them to stay home because there is nothing in particular needed with regard to her ingestion.  I obtained history from patient's daughter, Dirk Dress, who reports that patient has an at home nurse 5 days a week due to her resultant focal deficits.  She has a history of urinary and fecal incontinence.  She ambulates without any assistive devices, but moves particularly slow and has urge incontinence.  Evidently she was told earlier this week that she is prediabetic and had been advised to cut back on sugars.  Daughter suspects that she saw the bottle of Mr. Brigid Re and thought that it was her typical grape soda.  She also states that she recently was COVID-19 positive a few weeks ago and has diminished taste due to the virus and due to her CVA.  Daughter is confident that this is not due to depression or was intentional.  She states that she was at her typical baseline prior to ingestion.  She states that she already looks much improved now in the room and then prior to EMS arrival at their home.  Daughter suspects that she is now back to her usual baseline, she was just concerned given EMS report of hypotension initially on the scene.  Patient's vital signs have been stable and reassuring here in the ED.  She is denying any complaints.  Neuro exam is unremarkable.  Patient at baseline.  If laboratory work-up is unremarkable and patient is ambulatory, stable for discharge and outpatient  follow-up.  EKG without significant changes when compared to prior tracings.  CBC with anemia consistent with baseline.  No leukocytosis concerning for infection.  ED return precautions discussed.  Patient voiced understanding  and is agreeable to plan.   Final Clinical Impression(s) / ED Diagnoses Final diagnoses:  None    Rx / DC Orders ED Discharge Orders    None

## 2020-07-07 NOTE — ED Triage Notes (Addendum)
Pt BIB EMS from home. Was found by family sitting on the toilet by, drooling after drinking a large mouthful(s) of Mr. Clean cleaning liquid. Pt Nonverbal at baseline. Per EMS, family contacted Poison Control and were recommended to hydrate pt until cleaning liquid passes pt's system.  EMS given bolus and zofran  EMS:  BP 133/81 HR 64 CBG 260 98% RA 97.4 temp

## 2020-07-08 DIAGNOSIS — F0390 Unspecified dementia without behavioral disturbance: Secondary | ICD-10-CM | POA: Diagnosis not present

## 2020-07-08 LAB — URINALYSIS, ROUTINE W REFLEX MICROSCOPIC
Bilirubin Urine: NEGATIVE
Glucose, UA: NEGATIVE mg/dL
Hgb urine dipstick: NEGATIVE
Ketones, ur: NEGATIVE mg/dL
Leukocytes,Ua: NEGATIVE
Nitrite: NEGATIVE
Protein, ur: NEGATIVE mg/dL
Specific Gravity, Urine: 1.006 (ref 1.005–1.030)
pH: 7 (ref 5.0–8.0)

## 2020-07-08 LAB — COMPREHENSIVE METABOLIC PANEL
ALT: 38 U/L (ref 0–44)
AST: 47 U/L — ABNORMAL HIGH (ref 15–41)
Albumin: 3.6 g/dL (ref 3.5–5.0)
Alkaline Phosphatase: 49 U/L (ref 38–126)
Anion gap: 13 (ref 5–15)
BUN: 23 mg/dL — ABNORMAL HIGH (ref 6–20)
CO2: 21 mmol/L — ABNORMAL LOW (ref 22–32)
Calcium: 8.7 mg/dL — ABNORMAL LOW (ref 8.9–10.3)
Chloride: 107 mmol/L (ref 98–111)
Creatinine, Ser: 1.37 mg/dL — ABNORMAL HIGH (ref 0.44–1.00)
GFR, Estimated: 44 mL/min — ABNORMAL LOW (ref >60)
Glucose, Bld: 212 mg/dL — ABNORMAL HIGH (ref 70–99)
Potassium: 3.9 mmol/L (ref 3.5–5.1)
Sodium: 141 mmol/L (ref 135–145)
Total Bilirubin: 0.9 mg/dL (ref 0.3–1.2)
Total Protein: 7.4 g/dL (ref 6.5–8.1)

## 2020-07-08 LAB — RAPID URINE DRUG SCREEN, HOSP PERFORMED
Amphetamines: NOT DETECTED
Barbiturates: NOT DETECTED
Benzodiazepines: NOT DETECTED
Cocaine: NOT DETECTED
Opiates: NOT DETECTED
Tetrahydrocannabinol: NOT DETECTED

## 2020-07-08 LAB — ETHANOL: Alcohol, Ethyl (B): <10 mg/dL (ref <10)

## 2020-07-08 LAB — SALICYLATE LEVEL: Salicylate Lvl: <7 mg/dL — ABNORMAL LOW (ref 7.0–30.0)

## 2020-07-08 LAB — ACETAMINOPHEN LEVEL: Acetaminophen (Tylenol), Serum: <10 ug/mL — ABNORMAL LOW (ref 10–30)

## 2020-07-08 NOTE — ED Notes (Signed)
E-signature pad unavailable at time of pt discharge. This RN discussed discharge materials with pt and answered all pt questions. Pt stated understanding of discharge material. ? ?

## 2020-07-08 NOTE — Discharge Instructions (Addendum)
Please follow-up with your primary care provider for ongoing evaluation and management.  Please note that your sugars were once again elevated here in the ED.  Your blood glucose was 212.  Continue outpatient work-up for diabetes/prediabetes.  Otherwise your work-up was reassuring.  Return to the ED or seek immediate medical attention should you experience any new or worsening symptoms.

## 2020-07-09 ENCOUNTER — Telehealth: Payer: Self-pay

## 2020-07-09 DIAGNOSIS — F0390 Unspecified dementia without behavioral disturbance: Secondary | ICD-10-CM | POA: Diagnosis not present

## 2020-07-09 NOTE — Telephone Encounter (Signed)
Transition Care Management Unsuccessful Follow-up Telephone Call  Date of discharge and from where:  07/08/20 from [facility]  Attempts:  1st Attempt  Reason for unsuccessful TCM follow-up call:  Left voice message

## 2020-07-12 DIAGNOSIS — F0390 Unspecified dementia without behavioral disturbance: Secondary | ICD-10-CM | POA: Diagnosis not present

## 2020-07-13 DIAGNOSIS — F0390 Unspecified dementia without behavioral disturbance: Secondary | ICD-10-CM | POA: Diagnosis not present

## 2020-07-14 DIAGNOSIS — F0390 Unspecified dementia without behavioral disturbance: Secondary | ICD-10-CM | POA: Diagnosis not present

## 2020-07-15 DIAGNOSIS — F0390 Unspecified dementia without behavioral disturbance: Secondary | ICD-10-CM | POA: Diagnosis not present

## 2020-07-16 DIAGNOSIS — F0390 Unspecified dementia without behavioral disturbance: Secondary | ICD-10-CM | POA: Diagnosis not present

## 2020-07-19 DIAGNOSIS — F0390 Unspecified dementia without behavioral disturbance: Secondary | ICD-10-CM | POA: Diagnosis not present

## 2020-07-20 DIAGNOSIS — F0390 Unspecified dementia without behavioral disturbance: Secondary | ICD-10-CM | POA: Diagnosis not present

## 2020-07-21 DIAGNOSIS — F0390 Unspecified dementia without behavioral disturbance: Secondary | ICD-10-CM | POA: Diagnosis not present

## 2020-07-22 DIAGNOSIS — F0390 Unspecified dementia without behavioral disturbance: Secondary | ICD-10-CM | POA: Diagnosis not present

## 2020-07-23 DIAGNOSIS — F0390 Unspecified dementia without behavioral disturbance: Secondary | ICD-10-CM | POA: Diagnosis not present

## 2020-07-26 DIAGNOSIS — F0390 Unspecified dementia without behavioral disturbance: Secondary | ICD-10-CM | POA: Diagnosis not present

## 2020-08-02 DIAGNOSIS — F0390 Unspecified dementia without behavioral disturbance: Secondary | ICD-10-CM | POA: Diagnosis not present

## 2020-08-06 DIAGNOSIS — F0390 Unspecified dementia without behavioral disturbance: Secondary | ICD-10-CM | POA: Diagnosis not present

## 2020-08-09 DIAGNOSIS — F0390 Unspecified dementia without behavioral disturbance: Secondary | ICD-10-CM | POA: Diagnosis not present

## 2020-08-10 DIAGNOSIS — F0390 Unspecified dementia without behavioral disturbance: Secondary | ICD-10-CM | POA: Diagnosis not present

## 2020-08-11 DIAGNOSIS — F0390 Unspecified dementia without behavioral disturbance: Secondary | ICD-10-CM | POA: Diagnosis not present

## 2020-08-12 DIAGNOSIS — F0390 Unspecified dementia without behavioral disturbance: Secondary | ICD-10-CM | POA: Diagnosis not present

## 2020-08-13 DIAGNOSIS — F0390 Unspecified dementia without behavioral disturbance: Secondary | ICD-10-CM | POA: Diagnosis not present

## 2020-08-16 DIAGNOSIS — F0390 Unspecified dementia without behavioral disturbance: Secondary | ICD-10-CM | POA: Diagnosis not present

## 2020-08-17 DIAGNOSIS — F0390 Unspecified dementia without behavioral disturbance: Secondary | ICD-10-CM | POA: Diagnosis not present

## 2020-08-18 DIAGNOSIS — F0390 Unspecified dementia without behavioral disturbance: Secondary | ICD-10-CM | POA: Diagnosis not present

## 2020-08-19 DIAGNOSIS — F0390 Unspecified dementia without behavioral disturbance: Secondary | ICD-10-CM | POA: Diagnosis not present

## 2020-08-20 DIAGNOSIS — F0390 Unspecified dementia without behavioral disturbance: Secondary | ICD-10-CM | POA: Diagnosis not present

## 2020-08-23 DIAGNOSIS — F0390 Unspecified dementia without behavioral disturbance: Secondary | ICD-10-CM | POA: Diagnosis not present

## 2020-08-24 DIAGNOSIS — F0390 Unspecified dementia without behavioral disturbance: Secondary | ICD-10-CM | POA: Diagnosis not present

## 2020-08-25 DIAGNOSIS — F0390 Unspecified dementia without behavioral disturbance: Secondary | ICD-10-CM | POA: Diagnosis not present

## 2020-08-26 DIAGNOSIS — F0390 Unspecified dementia without behavioral disturbance: Secondary | ICD-10-CM | POA: Diagnosis not present

## 2020-08-27 DIAGNOSIS — F0390 Unspecified dementia without behavioral disturbance: Secondary | ICD-10-CM | POA: Diagnosis not present

## 2020-08-30 DIAGNOSIS — F0390 Unspecified dementia without behavioral disturbance: Secondary | ICD-10-CM | POA: Diagnosis not present

## 2020-08-31 DIAGNOSIS — F0390 Unspecified dementia without behavioral disturbance: Secondary | ICD-10-CM | POA: Diagnosis not present

## 2020-09-01 DIAGNOSIS — F0390 Unspecified dementia without behavioral disturbance: Secondary | ICD-10-CM | POA: Diagnosis not present

## 2020-09-02 DIAGNOSIS — F0390 Unspecified dementia without behavioral disturbance: Secondary | ICD-10-CM | POA: Diagnosis not present

## 2020-09-03 DIAGNOSIS — F0390 Unspecified dementia without behavioral disturbance: Secondary | ICD-10-CM | POA: Diagnosis not present

## 2020-09-03 DIAGNOSIS — R32 Unspecified urinary incontinence: Secondary | ICD-10-CM | POA: Diagnosis not present

## 2020-09-07 DIAGNOSIS — F0390 Unspecified dementia without behavioral disturbance: Secondary | ICD-10-CM | POA: Diagnosis not present

## 2020-09-08 DIAGNOSIS — F0390 Unspecified dementia without behavioral disturbance: Secondary | ICD-10-CM | POA: Diagnosis not present

## 2020-09-09 DIAGNOSIS — F0390 Unspecified dementia without behavioral disturbance: Secondary | ICD-10-CM | POA: Diagnosis not present

## 2020-09-10 DIAGNOSIS — F0390 Unspecified dementia without behavioral disturbance: Secondary | ICD-10-CM | POA: Diagnosis not present

## 2020-09-13 DIAGNOSIS — F0390 Unspecified dementia without behavioral disturbance: Secondary | ICD-10-CM | POA: Diagnosis not present

## 2020-09-15 ENCOUNTER — Ambulatory Visit: Payer: Medicaid Other | Admitting: Family Medicine

## 2020-09-15 DIAGNOSIS — F0390 Unspecified dementia without behavioral disturbance: Secondary | ICD-10-CM | POA: Diagnosis not present

## 2020-09-16 DIAGNOSIS — F0390 Unspecified dementia without behavioral disturbance: Secondary | ICD-10-CM | POA: Diagnosis not present

## 2020-09-20 DIAGNOSIS — F0390 Unspecified dementia without behavioral disturbance: Secondary | ICD-10-CM | POA: Diagnosis not present

## 2020-09-21 DIAGNOSIS — F0390 Unspecified dementia without behavioral disturbance: Secondary | ICD-10-CM | POA: Diagnosis not present

## 2020-09-22 DIAGNOSIS — F0390 Unspecified dementia without behavioral disturbance: Secondary | ICD-10-CM | POA: Diagnosis not present

## 2020-09-23 DIAGNOSIS — F0390 Unspecified dementia without behavioral disturbance: Secondary | ICD-10-CM | POA: Diagnosis not present

## 2020-09-24 DIAGNOSIS — F0390 Unspecified dementia without behavioral disturbance: Secondary | ICD-10-CM | POA: Diagnosis not present

## 2020-09-27 DIAGNOSIS — F0390 Unspecified dementia without behavioral disturbance: Secondary | ICD-10-CM | POA: Diagnosis not present

## 2020-09-28 DIAGNOSIS — F0390 Unspecified dementia without behavioral disturbance: Secondary | ICD-10-CM | POA: Diagnosis not present

## 2020-09-29 DIAGNOSIS — F0390 Unspecified dementia without behavioral disturbance: Secondary | ICD-10-CM | POA: Diagnosis not present

## 2020-09-30 DIAGNOSIS — F0390 Unspecified dementia without behavioral disturbance: Secondary | ICD-10-CM | POA: Diagnosis not present

## 2020-10-01 DIAGNOSIS — F0390 Unspecified dementia without behavioral disturbance: Secondary | ICD-10-CM | POA: Diagnosis not present

## 2020-10-04 DIAGNOSIS — F0390 Unspecified dementia without behavioral disturbance: Secondary | ICD-10-CM | POA: Diagnosis not present

## 2020-10-05 DIAGNOSIS — F0390 Unspecified dementia without behavioral disturbance: Secondary | ICD-10-CM | POA: Diagnosis not present

## 2020-10-06 DIAGNOSIS — F0390 Unspecified dementia without behavioral disturbance: Secondary | ICD-10-CM | POA: Diagnosis not present

## 2020-10-07 DIAGNOSIS — F0390 Unspecified dementia without behavioral disturbance: Secondary | ICD-10-CM | POA: Diagnosis not present

## 2020-10-08 DIAGNOSIS — F0390 Unspecified dementia without behavioral disturbance: Secondary | ICD-10-CM | POA: Diagnosis not present

## 2020-10-11 DIAGNOSIS — F0390 Unspecified dementia without behavioral disturbance: Secondary | ICD-10-CM | POA: Diagnosis not present

## 2020-10-11 DIAGNOSIS — R32 Unspecified urinary incontinence: Secondary | ICD-10-CM | POA: Diagnosis not present

## 2020-10-12 DIAGNOSIS — F0390 Unspecified dementia without behavioral disturbance: Secondary | ICD-10-CM | POA: Diagnosis not present

## 2020-10-13 DIAGNOSIS — F0390 Unspecified dementia without behavioral disturbance: Secondary | ICD-10-CM | POA: Diagnosis not present

## 2020-10-14 DIAGNOSIS — F0390 Unspecified dementia without behavioral disturbance: Secondary | ICD-10-CM | POA: Diagnosis not present

## 2020-10-15 DIAGNOSIS — F0390 Unspecified dementia without behavioral disturbance: Secondary | ICD-10-CM | POA: Diagnosis not present

## 2020-10-18 DIAGNOSIS — F0390 Unspecified dementia without behavioral disturbance: Secondary | ICD-10-CM | POA: Diagnosis not present

## 2020-10-19 DIAGNOSIS — F0390 Unspecified dementia without behavioral disturbance: Secondary | ICD-10-CM | POA: Diagnosis not present

## 2020-10-20 DIAGNOSIS — F0390 Unspecified dementia without behavioral disturbance: Secondary | ICD-10-CM | POA: Diagnosis not present

## 2020-10-21 DIAGNOSIS — F0390 Unspecified dementia without behavioral disturbance: Secondary | ICD-10-CM | POA: Diagnosis not present

## 2020-10-22 DIAGNOSIS — F0390 Unspecified dementia without behavioral disturbance: Secondary | ICD-10-CM | POA: Diagnosis not present

## 2020-10-25 DIAGNOSIS — F0390 Unspecified dementia without behavioral disturbance: Secondary | ICD-10-CM | POA: Diagnosis not present

## 2020-10-26 DIAGNOSIS — F0390 Unspecified dementia without behavioral disturbance: Secondary | ICD-10-CM | POA: Diagnosis not present

## 2020-10-27 DIAGNOSIS — F0390 Unspecified dementia without behavioral disturbance: Secondary | ICD-10-CM | POA: Diagnosis not present

## 2020-10-28 DIAGNOSIS — F0390 Unspecified dementia without behavioral disturbance: Secondary | ICD-10-CM | POA: Diagnosis not present

## 2020-10-29 DIAGNOSIS — F0390 Unspecified dementia without behavioral disturbance: Secondary | ICD-10-CM | POA: Diagnosis not present

## 2020-11-01 DIAGNOSIS — F0390 Unspecified dementia without behavioral disturbance: Secondary | ICD-10-CM | POA: Diagnosis not present

## 2020-11-02 DIAGNOSIS — F0390 Unspecified dementia without behavioral disturbance: Secondary | ICD-10-CM | POA: Diagnosis not present

## 2020-11-03 DIAGNOSIS — F0390 Unspecified dementia without behavioral disturbance: Secondary | ICD-10-CM | POA: Diagnosis not present

## 2020-11-05 ENCOUNTER — Telehealth: Payer: Self-pay | Admitting: Family Medicine

## 2020-11-05 NOTE — Telephone Encounter (Signed)
..   Medicaid Managed Care   Unsuccessful Outreach Note  11/05/2020 Name: Erica Lopez MRN: [id number] DOB: Feb 07, 1961  Referred by: Hoy Register, MD Reason for referral : High Risk Managed Medicaid (Attempted to reach Ms.Leamy's daughter to see about getting them scheduled for a phone visit with the [facility] team. I left a message on the daughters VM.)   An unsuccessful telephone outreach was attempted today. The patient was referred to the case management team for assistance with care management and care coordination.   Follow Up Plan: The care management team will reach out to the patient again over the next 7-14 days.   Weston Settle Care Guide, High Risk Medicaid Managed Care Embedded Care Coordination [facility]  [facility]

## 2020-11-08 DIAGNOSIS — F0390 Unspecified dementia without behavioral disturbance: Secondary | ICD-10-CM | POA: Diagnosis not present

## 2020-11-09 DIAGNOSIS — F0390 Unspecified dementia without behavioral disturbance: Secondary | ICD-10-CM | POA: Diagnosis not present

## 2020-11-10 DIAGNOSIS — F0390 Unspecified dementia without behavioral disturbance: Secondary | ICD-10-CM | POA: Diagnosis not present

## 2020-11-11 DIAGNOSIS — F0390 Unspecified dementia without behavioral disturbance: Secondary | ICD-10-CM | POA: Diagnosis not present

## 2020-11-12 DIAGNOSIS — F0390 Unspecified dementia without behavioral disturbance: Secondary | ICD-10-CM | POA: Diagnosis not present

## 2020-11-15 DIAGNOSIS — F0390 Unspecified dementia without behavioral disturbance: Secondary | ICD-10-CM | POA: Diagnosis not present

## 2020-11-16 DIAGNOSIS — F0390 Unspecified dementia without behavioral disturbance: Secondary | ICD-10-CM | POA: Diagnosis not present

## 2020-11-17 ENCOUNTER — Encounter: Payer: Self-pay | Admitting: Family Medicine

## 2020-11-17 ENCOUNTER — Ambulatory Visit: Payer: Medicaid Other | Attending: Family Medicine | Admitting: Family Medicine

## 2020-11-17 ENCOUNTER — Encounter (INDEPENDENT_AMBULATORY_CARE_PROVIDER_SITE_OTHER): Payer: Self-pay

## 2020-11-17 ENCOUNTER — Other Ambulatory Visit: Payer: Self-pay

## 2020-11-17 VITALS — BP 136/81 | HR 71 | Ht 63.0 in | Wt 93.8 lb

## 2020-11-17 DIAGNOSIS — I10 Essential (primary) hypertension: Secondary | ICD-10-CM | POA: Diagnosis not present

## 2020-11-17 DIAGNOSIS — Z1211 Encounter for screening for malignant neoplasm of colon: Secondary | ICD-10-CM

## 2020-11-17 DIAGNOSIS — F0390 Unspecified dementia without behavioral disturbance: Secondary | ICD-10-CM | POA: Diagnosis not present

## 2020-11-17 DIAGNOSIS — Z1331 Encounter for screening for depression: Secondary | ICD-10-CM | POA: Diagnosis not present

## 2020-11-17 MED ORDER — AMLODIPINE BESYLATE 10 MG PO TABS: 10.0000 mg | ORAL_TABLET | Freq: Every day | ORAL | 1 refills | Status: DC

## 2020-11-17 MED ORDER — CARVEDILOL 3.125 MG PO TABS: 3.1250 mg | ORAL_TABLET | Freq: Two times a day (BID) | ORAL | 1 refills | Status: DC

## 2020-11-17 NOTE — Progress Notes (Signed)
Subjective:  Patient ID: Erica Lopez, female    DOB: January 23, 1961  Age: 60 y.o. MRN: [id number]  CC: Hypertension   HPI Erica Lopez  is a 60 year old female with Medical history significant for CVA in 2014 with resulting mild cognitive deficits and no hemiparesis, Hypertension, Protein calorie malnutrition who comes in for a follow-up of her hypertension accompanied by her daughter.  Interval History: She has a huge appetite but has not gained much weight.  Last TSH was normal.  She is due for colonoscopy which she declines and also due for mammogram which she also declines.  In 06/2020 she was managed for accidental ingestion of substance but feels fine now. Daughter denies presence of behavioral abnormalities, hallucinations.  Her nurse comes to provide care for her on weekdays 3hr/day. She has no additional concerns today. Past Medical History:  Diagnosis Date   Hypertension    Stroke (HCC) 2014    No past surgical history on file.  Family History  Problem Relation Age of Onset   Cancer Other    Hypertension Other    Stroke Other    Heart disease Other    Diabetes Other     Allergies  Allergen Reactions   Aspirin Anaphylaxis and Other (See Comments)    Bleeding event   Lisinopril Swelling   Other Hives, Swelling and Other (See Comments)    Blueberries, causing hives and swelling    Outpatient Medications Prior to Visit  Medication Sig Dispense Refill   amLODipine (NORVASC) 10 MG tablet Take 1 tablet (10 mg total) by mouth daily. 30 tablet 3   carvedilol (COREG) 3.125 MG tablet Take 1 tablet (3.125 mg total) by mouth 2 (two) times daily with a meal. 180 tablet 1   No facility-administered medications prior to visit.     ROS Review of Systems  Constitutional:  Negative for activity change, appetite change and fatigue.  HENT:  Negative for congestion, sinus pressure and sore throat.   Eyes:  Negative for visual disturbance.  Respiratory:  Negative for cough, chest  tightness, shortness of breath and wheezing.   Cardiovascular:  Negative for chest pain and palpitations.  Gastrointestinal:  Negative for abdominal distention, abdominal pain and constipation.  Endocrine: Negative for polydipsia.  Genitourinary:  Negative for dysuria and frequency.  Musculoskeletal:  Negative for arthralgias and back pain.  Skin:  Negative for rash.  Neurological:  Negative for tremors, light-headedness and numbness.  Hematological:  Does not bruise/bleed easily.  Psychiatric/Behavioral:  Negative for agitation and behavioral problems.    Objective:  BP 136/81   Pulse 71   Ht 5\' 3"  (1.6 m)   Wt 93 lb 12.8 oz (42.5 kg)   SpO2 99%   BMI 16.62 kg/m   BP/Weight 11/17/2020 07/08/2020 06/17/2020  Systolic BP 136 112 150  Diastolic BP 81 79 84  Wt. (Lbs) 93.8 - 98  BMI 16.62 - 17.36      Physical Exam Constitutional:      Appearance: She is well-developed.  Neck:     Vascular: No JVD.  Cardiovascular:     Rate and Rhythm: Normal rate.     Heart sounds: Normal heart sounds. No murmur heard. Pulmonary:     Effort: Pulmonary effort is normal.     Breath sounds: Normal breath sounds. No wheezing or rales.  Chest:     Chest wall: No tenderness.  Abdominal:     General: Bowel sounds are normal. There is no distension.     Palpations:  Abdomen is soft. There is no mass.     Tenderness: There is no abdominal tenderness.  Musculoskeletal:        General: Normal range of motion.     Right lower leg: No edema.     Left lower leg: No edema.  Neurological:     Mental Status: She is alert. Mental status is at baseline.  Psychiatric:        Mood and Affect: Mood normal.    CMP Latest Ref Rng & Units 07/07/2020 06/17/2020 05/21/2020  Glucose 70 - 99 mg/dL 659(D) 357(S) 177(L)  BUN 6 - 20 mg/dL 39(Q) 22 15  Creatinine 0.44 - 1.00 mg/dL 3.00(P) 2.33(A) 0.76(A)  Sodium 135 - 145 mmol/L 141 144 141  Potassium 3.5 - 5.1 mmol/L 3.9 3.5 3.8  Chloride 98 - 111 mmol/L 107  105 104  CO2 22 - 32 mmol/L 21(L) 26 26  Calcium 8.9 - 10.3 mg/dL 2.6(J) 9.4 9.3  Total Protein 6.5 - 8.1 g/dL 7.4 7.8 -  Total Bilirubin 0.3 - 1.2 mg/dL 0.9 0.2 -  Alkaline Phos 38 - 126 U/L 49 79 -  AST 15 - 41 U/L 47(H) 23 -  ALT 0 - 44 U/L 38 28 -    Lipid Panel  No results found for: CHOL, TRIG, HDL, CHOLHDL, VLDL, LDLCALC, LDLDIRECT  CBC    Component Value Date/Time   WBC 8.7 07/07/2020 2252   RBC 5.12 (H) 07/07/2020 2252   HGB 10.9 (L) 07/07/2020 2252   HGB 10.5 (L) 06/17/2020 1616   HCT 38.3 07/07/2020 2252   HCT 34.9 06/17/2020 1616   PLT 231 07/07/2020 2252   PLT 241 06/17/2020 1616   MCV 74.8 (L) 07/07/2020 2252   MCV 72 (L) 06/17/2020 1616   MCH 21.3 (L) 07/07/2020 2252   MCHC 28.5 (L) 07/07/2020 2252   RDW 18.7 (H) 07/07/2020 2252   RDW 18.9 (H) 06/17/2020 1616   LYMPHSABS 1.1 07/07/2020 2252   LYMPHSABS 0.9 06/17/2020 1616   MONOABS 0.5 07/07/2020 2252   EOSABS 0.0 07/07/2020 2252   EOSABS 0.0 06/17/2020 1616   BASOSABS 0.0 07/07/2020 2252   BASOSABS 0.0 06/17/2020 1616    Lab Results  Component Value Date   HGBA1C 5.9 (H) 06/17/2020    Assessment & Plan:  1. Essential hypertension Controlled Counseled on blood pressure goal of less than 130/80, low-sodium, DASH diet, medication compliance, 150 minutes of moderate intensity exercise per week. Discussed medication compliance, adverse effects. - amLODipine (NORVASC) 10 MG tablet; Take 1 tablet (10 mg total) by mouth daily.  Dispense: 90 tablet; Refill: 1 - carvedilol (COREG) 3.125 MG tablet; Take 1 tablet (3.125 mg total) by mouth 2 (two) times daily with a meal.  Dispense: 180 tablet; Refill: 1  2. Screening for colon cancer Declines colonoscopy so we will order Cologuard test - Cologuard   Health Care Maintenance: Declines mammogram Meds ordered this encounter  Medications   amLODipine (NORVASC) 10 MG tablet    Sig: Take 1 tablet (10 mg total) by mouth daily.    Dispense:  90 tablet     Refill:  1    Enough until appt   carvedilol (COREG) 3.125 MG tablet    Sig: Take 1 tablet (3.125 mg total) by mouth 2 (two) times daily with a meal.    Dispense:  180 tablet    Refill:  1    Follow-up: Return in about 6 months (around 05/19/2021) for medical conditions.  Hoy Register, MD, FAAFP. [facility] [place], [place] [phone]   11/17/2020, 5:34 PM

## 2020-11-17 NOTE — Progress Notes (Signed)
Medication refill

## 2020-11-18 DIAGNOSIS — F0390 Unspecified dementia without behavioral disturbance: Secondary | ICD-10-CM | POA: Diagnosis not present

## 2020-11-19 DIAGNOSIS — F0390 Unspecified dementia without behavioral disturbance: Secondary | ICD-10-CM | POA: Diagnosis not present

## 2020-11-19 DIAGNOSIS — R32 Unspecified urinary incontinence: Secondary | ICD-10-CM | POA: Diagnosis not present

## 2020-11-22 DIAGNOSIS — F0390 Unspecified dementia without behavioral disturbance: Secondary | ICD-10-CM | POA: Diagnosis not present

## 2020-11-23 DIAGNOSIS — F0390 Unspecified dementia without behavioral disturbance: Secondary | ICD-10-CM | POA: Diagnosis not present

## 2020-11-24 DIAGNOSIS — F0390 Unspecified dementia without behavioral disturbance: Secondary | ICD-10-CM | POA: Diagnosis not present

## 2020-11-25 ENCOUNTER — Telehealth: Payer: Self-pay | Admitting: Family Medicine

## 2020-11-25 DIAGNOSIS — F0390 Unspecified dementia without behavioral disturbance: Secondary | ICD-10-CM | POA: Diagnosis not present

## 2020-11-25 NOTE — Telephone Encounter (Signed)
..   Medicaid Managed Care   Unsuccessful Outreach Note  11/25/2020 Name: Erica Lopez MRN: [id number] DOB: Jan 06, 1961  Referred by: Hoy Register, MD Reason for referral : High Risk Managed Medicaid (I called [name] daughter to get them scheduled for a phone visit with the Managed Medicaid team. I left my name and number on her VM.)   A second unsuccessful telephone outreach was attempted today. The patient was referred to the case management team for assistance with care management and care coordination.   Follow Up Plan: The care management team will reach out to the patient again over the next 7-14 days.   Weston Settle Care Guide, High Risk Medicaid Managed Care Embedded Care Coordination [facility]  [facility]

## 2020-11-26 DIAGNOSIS — F0390 Unspecified dementia without behavioral disturbance: Secondary | ICD-10-CM | POA: Diagnosis not present

## 2020-11-29 DIAGNOSIS — F0390 Unspecified dementia without behavioral disturbance: Secondary | ICD-10-CM | POA: Diagnosis not present

## 2020-11-30 DIAGNOSIS — F0390 Unspecified dementia without behavioral disturbance: Secondary | ICD-10-CM | POA: Diagnosis not present

## 2020-12-01 DIAGNOSIS — F0390 Unspecified dementia without behavioral disturbance: Secondary | ICD-10-CM | POA: Diagnosis not present

## 2020-12-02 DIAGNOSIS — F0390 Unspecified dementia without behavioral disturbance: Secondary | ICD-10-CM | POA: Diagnosis not present

## 2020-12-03 ENCOUNTER — Ambulatory Visit (HOSPITAL_COMMUNITY)
Admission: EM | Admit: 2020-12-03 | Discharge: 2020-12-03 | Disposition: A | Discharge status: Home / Self Care | Payer: Medicaid Other | Attending: Medical Oncology | Admitting: Medical Oncology

## 2020-12-03 ENCOUNTER — Encounter (HOSPITAL_COMMUNITY): Payer: Self-pay

## 2020-12-03 ENCOUNTER — Other Ambulatory Visit: Payer: Self-pay

## 2020-12-03 DIAGNOSIS — B029 Zoster without complications: Secondary | ICD-10-CM

## 2020-12-03 DIAGNOSIS — F0390 Unspecified dementia without behavioral disturbance: Secondary | ICD-10-CM | POA: Diagnosis not present

## 2020-12-03 MED ORDER — TRIAMCINOLONE ACETONIDE 0.025 % EX OINT: 1.0000 "application " | TOPICAL_OINTMENT | Freq: Two times a day (BID) | CUTANEOUS | 0 refills | Status: AC

## 2020-12-03 MED ORDER — TRIAMCINOLONE ACETONIDE 0.025 % EX OINT: 1.0000 "application " | TOPICAL_OINTMENT | Freq: Two times a day (BID) | CUTANEOUS | 0 refills | Status: DC

## 2020-12-03 NOTE — ED Triage Notes (Signed)
Pt presents with c/o a rash on the left shoulder X 4 days.

## 2020-12-03 NOTE — ED Provider Notes (Signed)
[facility]    CSN: [id number] Arrival date & time: 12/03/20  1842      History   Chief Complaint Chief Complaint  Patient presents with   Rash    HPI Erica Lopez is a 60 y.o. female. Brought in by her daughter and granddaughter  HPI  Rash: Patient states that she has had a rash of her left shoulder for the past 4 days. Rash is itchy and not painful. They have not applied anything to the rash. No other rash of body.   Past Medical History:  Diagnosis Date   Hypertension    Stroke [facility]) 2014    Patient Active Problem List   Diagnosis Date Noted   Incontinence of urine 05/27/2018   Vascular dementia (HCC) 04/23/2018   Protein calorie malnutrition (HCC) 08/10/2016   History of stroke 07/13/2016   Tinea pedis 07/13/2016   Underweight 07/13/2016   Hypertension 07/13/2016    History reviewed. No pertinent surgical history.  OB History   No obstetric history on file.      Home Medications    Prior to Admission medications   Medication Sig Start Date End Date Taking? Authorizing Provider  amLODipine (NORVASC) 10 MG tablet Take 1 tablet (10 mg total) by mouth daily. 11/17/20   Hoy Register, MD  carvedilol (COREG) 3.125 MG tablet Take 1 tablet (3.125 mg total) by mouth 2 (two) times daily with a meal. 11/17/20   Hoy Register, MD    Family History Family History  Problem Relation Age of Onset   Cancer Other    Hypertension Other    Stroke Other    Heart disease Other    Diabetes Other     Social History Social History   Tobacco Use   Smoking status: Never   Smokeless tobacco: Never  Substance Use Topics   Alcohol use: No   Drug use: No     Allergies   Aspirin, Lisinopril, and Other   Review of Systems Review of Systems  As stated above in HPI Physical Exam Triage Vital Signs ED Triage Vitals  Enc Vitals Group     BP 12/03/20 1918 (!) 145/92     Pulse Rate 12/03/20 1918 80     Resp 12/03/20 1918 18     Temp 12/03/20 1918  98.3 F (36.8 C)     Temp Source 12/03/20 1918 Oral     SpO2 12/03/20 1918 93 %     Weight --      Height --      Head Circumference --      Peak Flow --      Pain Score 12/03/20 1921 0     Pain Loc --      Pain Edu? --      Excl. in GC? --    No data found.  Updated Vital Signs BP (!) 145/92   Pulse 80   Temp 98.3 F (36.8 C) (Oral)   Resp 18   SpO2 93%   Physical Exam Vitals and nursing note reviewed.  Constitutional:      General: She is not in acute distress.    Appearance: Normal appearance. She is not ill-appearing, toxic-appearing or diaphoretic.  Skin:    General: Skin is warm.     Comments: Small cluster of slightly erythematic clear vesicles.   Neurological:     Mental Status: She is alert.     UC Treatments / Results  Labs (all labs ordered are listed, but only  abnormal results are displayed) Labs Reviewed - No data to display  EKG   Radiology No results found.  Procedures Procedures (including critical care time)  Medications Ordered in UC Medications - No data to display  Initial Impression / Assessment and Plan / UC Course  I have reviewed the triage vital signs and the nursing notes.  Pertinent labs & imaging results that were available during my care of the patient were reviewed by me and considered in my medical decision making (see chart for details).     New. Discussed shingles with patient and daughter. She is past the treatment time for antivirals which I explained. For now will give topical steroids to help with itching sensation as I have seen some patients have success with this. Discussed typical course of illness.  Final Clinical Impressions(s) / UC Diagnoses   Final diagnoses:  None   Discharge Instructions   None    ED Prescriptions   None    PDMP not reviewed this encounter.   Rushie Chestnut, [place] 12/03/20 1947

## 2020-12-06 DIAGNOSIS — F0390 Unspecified dementia without behavioral disturbance: Secondary | ICD-10-CM | POA: Diagnosis not present

## 2020-12-07 DIAGNOSIS — F0390 Unspecified dementia without behavioral disturbance: Secondary | ICD-10-CM | POA: Diagnosis not present

## 2020-12-08 ENCOUNTER — Telehealth: Payer: Self-pay | Admitting: Family Medicine

## 2020-12-08 NOTE — Telephone Encounter (Signed)
..   Medicaid Managed Care   Unsuccessful Outreach Note  12/08/2020 Name: Erica Lopez MRN: [id number] DOB: 02/01/1961  Referred by: Hoy Register, MD Reason for referral : High Risk Managed Medicaid (Attempted to reach patient's daughter to get them scheduled for a phone visit with the MM Team. I left my name and number on her VM.)   Third unsuccessful telephone outreach was attempted today. The patient was referred to the case management team for assistance with care management and care coordination. The patient's primary care provider has been notified of our unsuccessful attempts to make or maintain contact with the patient. The care management team is pleased to engage with this patient at any time in the future should he/she be interested in assistance from the care management team.   Follow Up Plan: We have been unable to make contact with the patient for follow up. The care management team is available to follow up with the patient after provider conversation with the patient regarding recommendation for care management engagement and subsequent re-referral to the care management team.   Weston Settle Care Guide, High Risk Medicaid Managed Care Embedded Care Coordination [facility]  [facility]

## 2020-12-09 DIAGNOSIS — F0390 Unspecified dementia without behavioral disturbance: Secondary | ICD-10-CM | POA: Diagnosis not present

## 2020-12-10 DIAGNOSIS — F0390 Unspecified dementia without behavioral disturbance: Secondary | ICD-10-CM | POA: Diagnosis not present

## 2020-12-13 DIAGNOSIS — F0390 Unspecified dementia without behavioral disturbance: Secondary | ICD-10-CM | POA: Diagnosis not present

## 2020-12-14 DIAGNOSIS — F0390 Unspecified dementia without behavioral disturbance: Secondary | ICD-10-CM | POA: Diagnosis not present

## 2020-12-15 DIAGNOSIS — F0390 Unspecified dementia without behavioral disturbance: Secondary | ICD-10-CM | POA: Diagnosis not present

## 2020-12-16 DIAGNOSIS — F0390 Unspecified dementia without behavioral disturbance: Secondary | ICD-10-CM | POA: Diagnosis not present

## 2020-12-17 DIAGNOSIS — F0390 Unspecified dementia without behavioral disturbance: Secondary | ICD-10-CM | POA: Diagnosis not present

## 2020-12-20 DIAGNOSIS — F0390 Unspecified dementia without behavioral disturbance: Secondary | ICD-10-CM | POA: Diagnosis not present

## 2020-12-21 DIAGNOSIS — F0390 Unspecified dementia without behavioral disturbance: Secondary | ICD-10-CM | POA: Diagnosis not present

## 2020-12-22 DIAGNOSIS — F0390 Unspecified dementia without behavioral disturbance: Secondary | ICD-10-CM | POA: Diagnosis not present

## 2020-12-23 DIAGNOSIS — F0390 Unspecified dementia without behavioral disturbance: Secondary | ICD-10-CM | POA: Diagnosis not present

## 2020-12-24 DIAGNOSIS — F0390 Unspecified dementia without behavioral disturbance: Secondary | ICD-10-CM | POA: Diagnosis not present

## 2020-12-27 DIAGNOSIS — F0390 Unspecified dementia without behavioral disturbance: Secondary | ICD-10-CM | POA: Diagnosis not present

## 2020-12-28 DIAGNOSIS — F0390 Unspecified dementia without behavioral disturbance: Secondary | ICD-10-CM | POA: Diagnosis not present

## 2020-12-29 DIAGNOSIS — F0390 Unspecified dementia without behavioral disturbance: Secondary | ICD-10-CM | POA: Diagnosis not present

## 2020-12-30 DIAGNOSIS — F0390 Unspecified dementia without behavioral disturbance: Secondary | ICD-10-CM | POA: Diagnosis not present

## 2020-12-31 DIAGNOSIS — F0390 Unspecified dementia without behavioral disturbance: Secondary | ICD-10-CM | POA: Diagnosis not present

## 2021-01-03 DIAGNOSIS — F0390 Unspecified dementia without behavioral disturbance: Secondary | ICD-10-CM | POA: Diagnosis not present

## 2021-01-04 DIAGNOSIS — F0390 Unspecified dementia without behavioral disturbance: Secondary | ICD-10-CM | POA: Diagnosis not present

## 2021-01-05 DIAGNOSIS — F0390 Unspecified dementia without behavioral disturbance: Secondary | ICD-10-CM | POA: Diagnosis not present

## 2021-01-06 DIAGNOSIS — F0390 Unspecified dementia without behavioral disturbance: Secondary | ICD-10-CM | POA: Diagnosis not present

## 2021-01-07 DIAGNOSIS — F0390 Unspecified dementia without behavioral disturbance: Secondary | ICD-10-CM | POA: Diagnosis not present

## 2021-01-10 DIAGNOSIS — F0390 Unspecified dementia without behavioral disturbance: Secondary | ICD-10-CM | POA: Diagnosis not present

## 2021-01-11 DIAGNOSIS — F0390 Unspecified dementia without behavioral disturbance: Secondary | ICD-10-CM | POA: Diagnosis not present

## 2021-01-12 DIAGNOSIS — F0390 Unspecified dementia without behavioral disturbance: Secondary | ICD-10-CM | POA: Diagnosis not present

## 2021-01-13 DIAGNOSIS — F0390 Unspecified dementia without behavioral disturbance: Secondary | ICD-10-CM | POA: Diagnosis not present

## 2021-01-14 DIAGNOSIS — F0390 Unspecified dementia without behavioral disturbance: Secondary | ICD-10-CM | POA: Diagnosis not present

## 2021-01-17 DIAGNOSIS — F0390 Unspecified dementia without behavioral disturbance: Secondary | ICD-10-CM | POA: Diagnosis not present

## 2021-01-18 DIAGNOSIS — F0390 Unspecified dementia without behavioral disturbance: Secondary | ICD-10-CM | POA: Diagnosis not present

## 2021-01-19 DIAGNOSIS — F0390 Unspecified dementia without behavioral disturbance: Secondary | ICD-10-CM | POA: Diagnosis not present

## 2021-01-20 DIAGNOSIS — F0390 Unspecified dementia without behavioral disturbance: Secondary | ICD-10-CM | POA: Diagnosis not present

## 2021-01-21 DIAGNOSIS — F0390 Unspecified dementia without behavioral disturbance: Secondary | ICD-10-CM | POA: Diagnosis not present

## 2021-01-24 DIAGNOSIS — F0390 Unspecified dementia without behavioral disturbance: Secondary | ICD-10-CM | POA: Diagnosis not present

## 2021-01-25 DIAGNOSIS — F0390 Unspecified dementia without behavioral disturbance: Secondary | ICD-10-CM | POA: Diagnosis not present

## 2021-01-26 DIAGNOSIS — F0390 Unspecified dementia without behavioral disturbance: Secondary | ICD-10-CM | POA: Diagnosis not present

## 2021-01-27 DIAGNOSIS — F0390 Unspecified dementia without behavioral disturbance: Secondary | ICD-10-CM | POA: Diagnosis not present

## 2021-01-28 DIAGNOSIS — F0390 Unspecified dementia without behavioral disturbance: Secondary | ICD-10-CM | POA: Diagnosis not present

## 2021-01-31 DIAGNOSIS — F0390 Unspecified dementia without behavioral disturbance: Secondary | ICD-10-CM | POA: Diagnosis not present

## 2021-02-01 DIAGNOSIS — F0390 Unspecified dementia without behavioral disturbance: Secondary | ICD-10-CM | POA: Diagnosis not present

## 2021-02-02 DIAGNOSIS — F0390 Unspecified dementia without behavioral disturbance: Secondary | ICD-10-CM | POA: Diagnosis not present

## 2021-02-03 DIAGNOSIS — F0390 Unspecified dementia without behavioral disturbance: Secondary | ICD-10-CM | POA: Diagnosis not present

## 2021-02-04 DIAGNOSIS — F0390 Unspecified dementia without behavioral disturbance: Secondary | ICD-10-CM | POA: Diagnosis not present

## 2021-02-07 DIAGNOSIS — F0390 Unspecified dementia without behavioral disturbance: Secondary | ICD-10-CM | POA: Diagnosis not present

## 2021-02-08 DIAGNOSIS — F0390 Unspecified dementia without behavioral disturbance: Secondary | ICD-10-CM | POA: Diagnosis not present

## 2021-02-09 DIAGNOSIS — F0390 Unspecified dementia without behavioral disturbance: Secondary | ICD-10-CM | POA: Diagnosis not present

## 2021-02-10 DIAGNOSIS — F0390 Unspecified dementia without behavioral disturbance: Secondary | ICD-10-CM | POA: Diagnosis not present

## 2021-02-11 DIAGNOSIS — F0390 Unspecified dementia without behavioral disturbance: Secondary | ICD-10-CM | POA: Diagnosis not present

## 2021-02-14 DIAGNOSIS — F0390 Unspecified dementia without behavioral disturbance: Secondary | ICD-10-CM | POA: Diagnosis not present

## 2021-02-15 DIAGNOSIS — F0390 Unspecified dementia without behavioral disturbance: Secondary | ICD-10-CM | POA: Diagnosis not present

## 2021-02-16 DIAGNOSIS — F0390 Unspecified dementia without behavioral disturbance: Secondary | ICD-10-CM | POA: Diagnosis not present

## 2021-02-17 DIAGNOSIS — F0390 Unspecified dementia without behavioral disturbance: Secondary | ICD-10-CM | POA: Diagnosis not present

## 2021-02-18 DIAGNOSIS — F0390 Unspecified dementia without behavioral disturbance: Secondary | ICD-10-CM | POA: Diagnosis not present

## 2021-02-21 DIAGNOSIS — F0390 Unspecified dementia without behavioral disturbance: Secondary | ICD-10-CM | POA: Diagnosis not present

## 2021-02-23 DIAGNOSIS — F0390 Unspecified dementia without behavioral disturbance: Secondary | ICD-10-CM | POA: Diagnosis not present

## 2021-02-24 DIAGNOSIS — F0390 Unspecified dementia without behavioral disturbance: Secondary | ICD-10-CM | POA: Diagnosis not present

## 2021-02-25 DIAGNOSIS — F0390 Unspecified dementia without behavioral disturbance: Secondary | ICD-10-CM | POA: Diagnosis not present

## 2021-02-28 DIAGNOSIS — F0390 Unspecified dementia without behavioral disturbance: Secondary | ICD-10-CM | POA: Diagnosis not present

## 2021-03-01 DIAGNOSIS — F0390 Unspecified dementia without behavioral disturbance: Secondary | ICD-10-CM | POA: Diagnosis not present

## 2021-03-02 DIAGNOSIS — F0390 Unspecified dementia without behavioral disturbance: Secondary | ICD-10-CM | POA: Diagnosis not present

## 2021-03-03 DIAGNOSIS — F0390 Unspecified dementia without behavioral disturbance: Secondary | ICD-10-CM | POA: Diagnosis not present

## 2021-03-04 DIAGNOSIS — F0390 Unspecified dementia without behavioral disturbance: Secondary | ICD-10-CM | POA: Diagnosis not present

## 2021-03-07 DIAGNOSIS — F0390 Unspecified dementia without behavioral disturbance: Secondary | ICD-10-CM | POA: Diagnosis not present

## 2021-03-08 DIAGNOSIS — F0390 Unspecified dementia without behavioral disturbance: Secondary | ICD-10-CM | POA: Diagnosis not present

## 2021-03-09 DIAGNOSIS — F0390 Unspecified dementia without behavioral disturbance: Secondary | ICD-10-CM | POA: Diagnosis not present

## 2021-03-10 DIAGNOSIS — F0390 Unspecified dementia without behavioral disturbance: Secondary | ICD-10-CM | POA: Diagnosis not present

## 2021-03-11 DIAGNOSIS — F0390 Unspecified dementia without behavioral disturbance: Secondary | ICD-10-CM | POA: Diagnosis not present

## 2021-03-14 DIAGNOSIS — F0390 Unspecified dementia without behavioral disturbance: Secondary | ICD-10-CM | POA: Diagnosis not present

## 2021-03-15 DIAGNOSIS — F0390 Unspecified dementia without behavioral disturbance: Secondary | ICD-10-CM | POA: Diagnosis not present

## 2021-03-16 DIAGNOSIS — F0390 Unspecified dementia without behavioral disturbance: Secondary | ICD-10-CM | POA: Diagnosis not present

## 2021-03-17 DIAGNOSIS — F0390 Unspecified dementia without behavioral disturbance: Secondary | ICD-10-CM | POA: Diagnosis not present

## 2021-03-18 DIAGNOSIS — F0390 Unspecified dementia without behavioral disturbance: Secondary | ICD-10-CM | POA: Diagnosis not present

## 2021-03-21 DIAGNOSIS — F0390 Unspecified dementia without behavioral disturbance: Secondary | ICD-10-CM | POA: Diagnosis not present

## 2021-03-22 DIAGNOSIS — F0390 Unspecified dementia without behavioral disturbance: Secondary | ICD-10-CM | POA: Diagnosis not present

## 2021-03-23 DIAGNOSIS — F0390 Unspecified dementia without behavioral disturbance: Secondary | ICD-10-CM | POA: Diagnosis not present

## 2021-03-24 DIAGNOSIS — F0390 Unspecified dementia without behavioral disturbance: Secondary | ICD-10-CM | POA: Diagnosis not present

## 2021-03-25 DIAGNOSIS — F0390 Unspecified dementia without behavioral disturbance: Secondary | ICD-10-CM | POA: Diagnosis not present

## 2021-03-28 DIAGNOSIS — F0390 Unspecified dementia without behavioral disturbance: Secondary | ICD-10-CM | POA: Diagnosis not present

## 2021-03-29 DIAGNOSIS — F0390 Unspecified dementia without behavioral disturbance: Secondary | ICD-10-CM | POA: Diagnosis not present

## 2021-03-30 DIAGNOSIS — F0390 Unspecified dementia without behavioral disturbance: Secondary | ICD-10-CM | POA: Diagnosis not present

## 2021-03-31 DIAGNOSIS — F0390 Unspecified dementia without behavioral disturbance: Secondary | ICD-10-CM | POA: Diagnosis not present

## 2021-04-01 DIAGNOSIS — F0390 Unspecified dementia without behavioral disturbance: Secondary | ICD-10-CM | POA: Diagnosis not present

## 2021-04-04 ENCOUNTER — Telehealth: Payer: Self-pay

## 2021-04-04 DIAGNOSIS — F0390 Unspecified dementia without behavioral disturbance: Secondary | ICD-10-CM | POA: Diagnosis not present

## 2021-04-04 NOTE — Telephone Encounter (Signed)
DME form has been received and multiple calls has been paced to patient's daughter to schedule appointment to get office notes. VM is currently full and can not accept any messages.

## 2021-04-05 DIAGNOSIS — F0390 Unspecified dementia without behavioral disturbance: Secondary | ICD-10-CM | POA: Diagnosis not present

## 2021-04-06 DIAGNOSIS — F0150 Vascular dementia without behavioral disturbance: Secondary | ICD-10-CM | POA: Diagnosis not present

## 2021-04-06 NOTE — Telephone Encounter (Signed)
Copied from CRM [id number]. Topic: General - Other >> Apr 01, 2021  2:31 PM Gaetana Michaelis A wrote: Reason for CRM: The patient's daughter has returned a missed call from the practice  Please contact again when possible   Returned called no answer and vm full. When patient or daughter returns call please send teams message to either Alycia or Galin to get patient scheduled for a virtual with Dr. Alvis Lemmings

## 2021-04-07 DIAGNOSIS — F0150 Vascular dementia without behavioral disturbance: Secondary | ICD-10-CM | POA: Diagnosis not present

## 2021-04-08 DIAGNOSIS — F0150 Vascular dementia without behavioral disturbance: Secondary | ICD-10-CM | POA: Diagnosis not present

## 2021-04-11 ENCOUNTER — Encounter: Payer: Self-pay | Admitting: Family Medicine

## 2021-04-11 ENCOUNTER — Ambulatory Visit: Payer: Medicaid Other | Attending: Family Medicine | Admitting: Family Medicine

## 2021-04-11 ENCOUNTER — Other Ambulatory Visit: Payer: Self-pay

## 2021-04-11 DIAGNOSIS — I10 Essential (primary) hypertension: Secondary | ICD-10-CM | POA: Diagnosis not present

## 2021-04-11 DIAGNOSIS — R32 Unspecified urinary incontinence: Secondary | ICD-10-CM | POA: Diagnosis not present

## 2021-04-11 DIAGNOSIS — Z1211 Encounter for screening for malignant neoplasm of colon: Secondary | ICD-10-CM

## 2021-04-11 DIAGNOSIS — Z1231 Encounter for screening mammogram for malignant neoplasm of breast: Secondary | ICD-10-CM

## 2021-04-11 DIAGNOSIS — F0150 Vascular dementia without behavioral disturbance: Secondary | ICD-10-CM | POA: Diagnosis not present

## 2021-04-11 MED ORDER — AMLODIPINE BESYLATE 10 MG PO TABS: 10.0000 mg | ORAL_TABLET | Freq: Every day | ORAL | 1 refills | Status: DC

## 2021-04-11 MED ORDER — CARVEDILOL 3.125 MG PO TABS: 3.1250 mg | ORAL_TABLET | Freq: Two times a day (BID) | ORAL | 1 refills | Status: DC

## 2021-04-11 NOTE — Progress Notes (Addendum)
Virtual Visit via Telephone Note  I connected with Erica Lopez, on 04/11/2021 at 3:57 PM by telephone due to the COVID-19 pandemic and verified that I am speaking with the correct person using two identifiers.   Consent: I discussed the limitations, risks, security and privacy concerns of performing an evaluation and management service by telephone and the availability of in person appointments. I also discussed with the patient that there may be a patient responsible charge related to this service. The patient expressed understanding and agreed to proceed.   Location of Patient: Home  Location of Provider: Clinic   Persons participating in Telemedicine visit: Kathee Tumlin Farrington-CMA Daughter Dr. Alvis Lemmings     History of Present Illness: Erica Lopez is a 60 y.o. year old female with Medical history significant for CVA in 2014 with resulting mild cognitive deficits and no hemiparesis, Hypertension, Protein calorie malnutrition.  Previously took a statin but subsequently declined and resisted continuing with statin. She seen for follow-up visit endorses compliance with her antihypertensives. Bp is 131/90 at the moment. Denies acute concerns and has no chest pain, dyspnea. Reviewed labs with the patient and her daughter today and she did have a hemoglobin of 10.9 from labs of 06/2020.  She also receives PCS services due to history of dementia but has no behavioral disturbances.  Also uses depends due to urinary incontinence. She is due for colorectal cancer screening and breast cancer screening.   Past Medical History:  Diagnosis Date   Hypertension    Stroke [facility]) 2014   Allergies  Allergen Reactions   Aspirin Anaphylaxis and Other (See Comments)    Bleeding event   Lisinopril Swelling   Other Hives, Swelling and Other (See Comments)    Blueberries, causing hives and swelling    Current Outpatient Medications on File Prior to Visit  Medication Sig Dispense  Refill   amLODipine (NORVASC) 10 MG tablet Take 1 tablet (10 mg total) by mouth daily. 90 tablet 1   carvedilol (COREG) 3.125 MG tablet Take 1 tablet (3.125 mg total) by mouth 2 (two) times daily with a meal. 180 tablet 1   triamcinolone (KENALOG) 0.025 % ointment Apply 1 application topically 2 (two) times daily. 30 g 0   No current facility-administered medications on file prior to visit.    ROS: See HPI  Observations/Objective: Blood pressure-130/90 Awake, alert, oriented x3 Not in acute distress Normal mood   CMP Latest Ref Rng & Units 07/07/2020 06/17/2020 05/21/2020  Glucose 70 - 99 mg/dL 580(D) 983(J) 825(K)  BUN 6 - 20 mg/dL 53(Z) 22 15  Creatinine 0.44 - 1.00 mg/dL 7.67(H) 4.19(F) 7.90(W)  Sodium 135 - 145 mmol/L 141 144 141  Potassium 3.5 - 5.1 mmol/L 3.9 3.5 3.8  Chloride 98 - 111 mmol/L 107 105 104  CO2 22 - 32 mmol/L 21(L) 26 26  Calcium 8.9 - 10.3 mg/dL 4.0(X) 9.4 9.3  Total Protein 6.5 - 8.1 g/dL 7.4 7.8 -  Total Bilirubin 0.3 - 1.2 mg/dL 0.9 0.2 -  Alkaline Phos 38 - 126 U/L 49 79 -  AST 15 - 41 U/L 47(H) 23 -  ALT 0 - 44 U/L 38 28 -    Lipid Panel  No results found for: CHOL, TRIG, HDL, CHOLHDL, VLDL, LDLCALC, LDLDIRECT, LABVLDL  Lab Results  Component Value Date   HGBA1C 5.9 (H) 06/17/2020    Assessment and Plan: 1. Essential hypertension Controlled Counseled on blood pressure goal of less than 130/80, low-sodium, DASH diet, medication compliance,  150 minutes of moderate intensity exercise per week. Discussed medication compliance, adverse effects. - amLODipine (NORVASC) 10 MG tablet; Take 1 tablet (10 mg total) by mouth daily.  Dispense: 90 tablet; Refill: 1 - carvedilol (COREG) 3.125 MG tablet; Take 1 tablet (3.125 mg total) by mouth 2 (two) times daily with a meal.  Dispense: 180 tablet; Refill: 1  2. Screening for colon cancer - Cologuard  3. Encounter for screening mammogram for malignant neoplasm of breast - MM 3D SCREEN BREAST BILATERAL;  Future  4.  Vascular dementia Will complete PCS form  5.  Urinary incontinence She receives incontinence supplies via aero flow  Follow Up Instructions: 3 months for follow-up of hypertension and labs   I discussed the assessment and treatment plan with the patient. The patient was provided an opportunity to ask questions and all were answered. The patient agreed with the plan and demonstrated an understanding of the instructions.   The patient was advised to call back or seek an in-person evaluation if the symptoms worsen or if the condition fails to improve as anticipated.     I provided 11 minutes total of non-face-to-face time during this encounter.   Hoy Register, MD, FAAFP. [facility] [place], [place] [phone]   04/11/2021, 3:57 PM

## 2021-04-12 DIAGNOSIS — F0150 Vascular dementia without behavioral disturbance: Secondary | ICD-10-CM | POA: Diagnosis not present

## 2021-04-13 DIAGNOSIS — F0150 Vascular dementia without behavioral disturbance: Secondary | ICD-10-CM | POA: Diagnosis not present

## 2021-04-15 DIAGNOSIS — F0150 Vascular dementia without behavioral disturbance: Secondary | ICD-10-CM | POA: Diagnosis not present

## 2021-04-18 DIAGNOSIS — F0150 Vascular dementia without behavioral disturbance: Secondary | ICD-10-CM | POA: Diagnosis not present

## 2021-04-19 DIAGNOSIS — F0150 Vascular dementia without behavioral disturbance: Secondary | ICD-10-CM | POA: Diagnosis not present

## 2021-04-20 DIAGNOSIS — F0150 Vascular dementia without behavioral disturbance: Secondary | ICD-10-CM | POA: Diagnosis not present

## 2021-04-21 DIAGNOSIS — F0150 Vascular dementia without behavioral disturbance: Secondary | ICD-10-CM | POA: Diagnosis not present

## 2021-04-22 DIAGNOSIS — F0150 Vascular dementia without behavioral disturbance: Secondary | ICD-10-CM | POA: Diagnosis not present

## 2021-04-25 DIAGNOSIS — F0150 Vascular dementia without behavioral disturbance: Secondary | ICD-10-CM | POA: Diagnosis not present

## 2021-04-27 DIAGNOSIS — F0150 Vascular dementia without behavioral disturbance: Secondary | ICD-10-CM | POA: Diagnosis not present

## 2021-04-28 DIAGNOSIS — F0150 Vascular dementia without behavioral disturbance: Secondary | ICD-10-CM | POA: Diagnosis not present

## 2021-04-29 DIAGNOSIS — F0150 Vascular dementia without behavioral disturbance: Secondary | ICD-10-CM | POA: Diagnosis not present

## 2021-05-02 DIAGNOSIS — F0150 Vascular dementia without behavioral disturbance: Secondary | ICD-10-CM | POA: Diagnosis not present

## 2021-05-03 DIAGNOSIS — F0150 Vascular dementia without behavioral disturbance: Secondary | ICD-10-CM | POA: Diagnosis not present

## 2021-05-04 DIAGNOSIS — F0150 Vascular dementia without behavioral disturbance: Secondary | ICD-10-CM | POA: Diagnosis not present

## 2021-05-04 DIAGNOSIS — Z1211 Encounter for screening for malignant neoplasm of colon: Secondary | ICD-10-CM | POA: Diagnosis not present

## 2021-05-05 DIAGNOSIS — F0150 Vascular dementia without behavioral disturbance: Secondary | ICD-10-CM | POA: Diagnosis not present

## 2021-05-06 DIAGNOSIS — F0150 Vascular dementia without behavioral disturbance: Secondary | ICD-10-CM | POA: Diagnosis not present

## 2021-05-09 DIAGNOSIS — F0150 Vascular dementia without behavioral disturbance: Secondary | ICD-10-CM | POA: Diagnosis not present

## 2021-05-10 DIAGNOSIS — F0150 Vascular dementia without behavioral disturbance: Secondary | ICD-10-CM | POA: Diagnosis not present

## 2021-05-11 DIAGNOSIS — F0150 Vascular dementia without behavioral disturbance: Secondary | ICD-10-CM | POA: Diagnosis not present

## 2021-05-12 DIAGNOSIS — F0150 Vascular dementia without behavioral disturbance: Secondary | ICD-10-CM | POA: Diagnosis not present

## 2021-05-14 LAB — COLOGUARD: COLOGUARD: NEGATIVE

## 2021-05-16 DIAGNOSIS — F0150 Vascular dementia without behavioral disturbance: Secondary | ICD-10-CM | POA: Diagnosis not present

## 2021-05-17 DIAGNOSIS — F0150 Vascular dementia without behavioral disturbance: Secondary | ICD-10-CM | POA: Diagnosis not present

## 2021-05-18 DIAGNOSIS — F0150 Vascular dementia without behavioral disturbance: Secondary | ICD-10-CM | POA: Diagnosis not present

## 2021-05-19 ENCOUNTER — Encounter: Payer: Self-pay | Admitting: Family Medicine

## 2021-05-19 ENCOUNTER — Other Ambulatory Visit: Payer: Self-pay

## 2021-05-19 ENCOUNTER — Ambulatory Visit: Payer: Medicaid Other | Attending: Family Medicine | Admitting: Family Medicine

## 2021-05-19 VITALS — BP 179/97 | HR 62 | Ht 63.0 in | Wt 95.0 lb

## 2021-05-19 DIAGNOSIS — I10 Essential (primary) hypertension: Secondary | ICD-10-CM

## 2021-05-19 DIAGNOSIS — F0150 Vascular dementia without behavioral disturbance: Secondary | ICD-10-CM | POA: Diagnosis not present

## 2021-05-19 DIAGNOSIS — Z23 Encounter for immunization: Secondary | ICD-10-CM | POA: Diagnosis not present

## 2021-05-19 MED ORDER — CARVEDILOL 3.125 MG PO TABS: 3.1250 mg | ORAL_TABLET | Freq: Two times a day (BID) | ORAL | 1 refills | Status: DC

## 2021-05-19 MED ORDER — AMLODIPINE BESYLATE 10 MG PO TABS: 10.0000 mg | ORAL_TABLET | Freq: Every day | ORAL | 1 refills | Status: DC

## 2021-05-19 NOTE — Progress Notes (Signed)
Subjective:  Patient ID: Erica Lopez, female    DOB: 1961-04-28  Age: 60 y.o. MRN: [id number]  CC: Hypertension   HPI Erica Lopez is a 60 y.o. year old female with a history of CVA in 2014 with resulting mild cognitive deficits and no hemiparesis, Hypertension, Protein calorie malnutrition.  Previously took a statin but subsequently declined and resisted continuing with statin.  Interval History: BP is elevated and she is yet to take her medications as she was in a hurry to get to this appointment.  She is accompanied by her daughter who provides most of the history. Per daughter blood pressures at home have been controlled with systolics less than 749. She denies presence of chest pain, blurry vision and has no additional concerns today. Past Medical History:  Diagnosis Date   Hypertension    Stroke ([place]) 2014    No past surgical history on file.  Family History  Problem Relation Age of Onset   Cancer Other    Hypertension Other    Stroke Other    Heart disease Other    Diabetes Other     Allergies  Allergen Reactions   Aspirin Anaphylaxis and Other (See Comments)    Bleeding event   Lisinopril Swelling   Other Hives, Swelling and Other (See Comments)    Blueberries, causing hives and swelling    Outpatient Medications Prior to Visit  Medication Sig Dispense Refill   triamcinolone (KENALOG) 0.025 % ointment Apply 1 application topically 2 (two) times daily. 30 g 0   amLODipine (NORVASC) 10 MG tablet Take 1 tablet (10 mg total) by mouth daily. 90 tablet 1   carvedilol (COREG) 3.125 MG tablet Take 1 tablet (3.125 mg total) by mouth 2 (two) times daily with a meal. 180 tablet 1   No facility-administered medications prior to visit.     ROS Review of Systems  Constitutional:  Negative for activity change, appetite change and fatigue.  HENT:  Negative for congestion, sinus pressure and sore throat.   Eyes:  Negative for visual disturbance.  Respiratory:  Negative  for cough, chest tightness, shortness of breath and wheezing.   Cardiovascular:  Negative for chest pain and palpitations.  Gastrointestinal:  Negative for abdominal distention, abdominal pain and constipation.  Endocrine: Negative for polydipsia.  Genitourinary:  Negative for dysuria and frequency.  Musculoskeletal:  Negative for arthralgias and back pain.  Skin:  Negative for rash.  Neurological:  Negative for tremors, light-headedness and numbness.  Hematological:  Does not bruise/bleed easily.  Psychiatric/Behavioral:  Negative for agitation and behavioral problems.    Objective:  BP (!) 179/97 Comment: no medication this morning   Pulse 62    Ht '5\' 3"'  (1.6 m)    Wt 95 lb (43.1 kg)    SpO2 100%    BMI 16.83 kg/m   BP/Weight 05/19/2021 12/03/2020 4/49/6759  Systolic BP 163 846 659  Diastolic BP 97 92 81  Wt. (Lbs) 95 - 93.8  BMI 16.83 - 16.62      Physical Exam Constitutional:      Appearance: She is well-developed.     Comments: Underweight  Cardiovascular:     Rate and Rhythm: Normal rate.     Heart sounds: Normal heart sounds. No murmur heard. Pulmonary:     Effort: Pulmonary effort is normal.     Breath sounds: Normal breath sounds. No wheezing or rales.  Chest:     Chest wall: No tenderness.  Abdominal:     General:  Bowel sounds are normal. There is no distension.     Palpations: Abdomen is soft. There is no mass.     Tenderness: There is no abdominal tenderness.  Musculoskeletal:        General: Normal range of motion.     Right lower leg: No edema.     Left lower leg: No edema.  Neurological:     Mental Status: She is alert and oriented to person, place, and time.  Psychiatric:        Mood and Affect: Mood normal.    CMP Latest Ref Rng & Units 07/07/2020 06/17/2020 05/21/2020  Glucose 70 - 99 mg/dL 212(H) 114(H) 145(H)  BUN 6 - 20 mg/dL 23(H) 22 15  Creatinine 0.44 - 1.00 mg/dL 1.37(H) 1.26(H) 1.28(H)  Sodium 135 - 145 mmol/L 141 144 141  Potassium 3.5 -  5.1 mmol/L 3.9 3.5 3.8  Chloride 98 - 111 mmol/L 107 105 104  CO2 22 - 32 mmol/L 21(L) 26 26  Calcium 8.9 - 10.3 mg/dL 8.7(L) 9.4 9.3  Total Protein 6.5 - 8.1 g/dL 7.4 7.8 -  Total Bilirubin 0.3 - 1.2 mg/dL 0.9 0.2 -  Alkaline Phos 38 - 126 U/L 49 79 -  AST 15 - 41 U/L 47(H) 23 -  ALT 0 - 44 U/L 38 28 -    Lipid Panel  No results found for: CHOL, TRIG, HDL, CHOLHDL, VLDL, LDLCALC, LDLDIRECT  CBC    Component Value Date/Time   WBC 8.7 07/07/2020 2252   RBC 5.12 (H) 07/07/2020 2252   HGB 10.9 (L) 07/07/2020 2252   HGB 10.5 (L) 06/17/2020 1616   HCT 38.3 07/07/2020 2252   HCT 34.9 06/17/2020 1616   PLT 231 07/07/2020 2252   PLT 241 06/17/2020 1616   MCV 74.8 (L) 07/07/2020 2252   MCV 72 (L) 06/17/2020 1616   MCH 21.3 (L) 07/07/2020 2252   MCHC 28.5 (L) 07/07/2020 2252   RDW 18.7 (H) 07/07/2020 2252   RDW 18.9 (H) 06/17/2020 1616   LYMPHSABS 1.1 07/07/2020 2252   LYMPHSABS 0.9 06/17/2020 1616   MONOABS 0.5 07/07/2020 2252   EOSABS 0.0 07/07/2020 2252   EOSABS 0.0 06/17/2020 1616   BASOSABS 0.0 07/07/2020 2252   BASOSABS 0.0 06/17/2020 1616    Lab Results  Component Value Date   HGBA1C 5.9 (H) 06/17/2020    Assessment & Plan:  1. Essential hypertension Uncontrolled due to the fact that she is yet to take her antihypertensive She has been taking this at home consistently and blood pressures have been controlled per daughter No regimen change today Counseled on blood pressure goal of less than 130/80, low-sodium, DASH diet, medication compliance, 150 minutes of moderate intensity exercise per week. Discussed medication compliance, adverse effects. - CMP14+EGFR - LP+Non-HDL Cholesterol - amLODipine (NORVASC) 10 MG tablet; Take 1 tablet (10 mg total) by mouth daily.  Dispense: 90 tablet; Refill: 1 - carvedilol (COREG) 3.125 MG tablet; Take 1 tablet (3.125 mg total) by mouth 2 (two) times daily with a meal.  Dispense: 180 tablet; Refill: 1  2. Vascular dementia without  behavioral disturbance (HCC) With cognitive impairment She needs to be on a statin but has declined in the past I will send of lipid panel and sent prescription for statin to her pharmacy after that. Risk factor modification    Meds ordered this encounter  Medications   amLODipine (NORVASC) 10 MG tablet    Sig: Take 1 tablet (10 mg total) by mouth daily.  Dispense:  90 tablet    Refill:  1   carvedilol (COREG) 3.125 MG tablet    Sig: Take 1 tablet (3.125 mg total) by mouth 2 (two) times daily with a meal.    Dispense:  180 tablet    Refill:  1    Follow-up: Return in about 3 months (around 08/17/2021) for Chronic medical conditions.       Charlott Rakes, MD, FAAFP. [facility] and [place], [place]   05/19/2021, 1:42 PM

## 2021-05-19 NOTE — Patient Instructions (Signed)

## 2021-05-20 ENCOUNTER — Telehealth: Payer: Self-pay

## 2021-05-20 ENCOUNTER — Other Ambulatory Visit: Payer: Self-pay | Admitting: Family Medicine

## 2021-05-20 DIAGNOSIS — F0150 Vascular dementia without behavioral disturbance: Secondary | ICD-10-CM | POA: Diagnosis not present

## 2021-05-20 LAB — CMP14+EGFR
ALT: 11 [IU]/L (ref 0–32)
AST: 14 [IU]/L (ref 0–40)
Albumin/Globulin Ratio: 1.2 (ref 1.2–2.2)
Albumin: 4.6 g/dL (ref 3.8–4.8)
Alkaline Phosphatase: 64 [IU]/L (ref 44–121)
BUN/Creatinine Ratio: 14 (ref 12–28)
BUN: 16 mg/dL (ref 8–27)
Bilirubin Total: 0.3 mg/dL (ref 0.0–1.2)
CO2: 20 mmol/L (ref 20–29)
Calcium: 9.4 mg/dL (ref 8.7–10.3)
Chloride: 105 mmol/L (ref 96–106)
Creatinine, Ser: 1.12 mg/dL — ABNORMAL HIGH (ref 0.57–1.00)
Globulin, Total: 3.7 g/dL (ref 1.5–4.5)
Glucose: 81 mg/dL (ref 70–99)
Potassium: 4.2 mmol/L (ref 3.5–5.2)
Sodium: 144 mmol/L (ref 134–144)
Total Protein: 8.3 g/dL (ref 6.0–8.5)
eGFR: 56 mL/min/{1.73_m2} — ABNORMAL LOW

## 2021-05-20 LAB — LP+NON-HDL CHOLESTEROL
Cholesterol, Total: 224 mg/dL — ABNORMAL HIGH (ref 100–199)
HDL: 79 mg/dL (ref >39)
LDL Chol Calc (NIH): 126 mg/dL — ABNORMAL HIGH (ref 0–99)
Total Non-HDL-Chol (LDL+VLDL): 145 mg/dL — ABNORMAL HIGH (ref 0–129)
Triglycerides: 111 mg/dL (ref 0–149)
VLDL Cholesterol Cal: 19 mg/dL (ref 5–40)

## 2021-05-20 MED ORDER — ATORVASTATIN CALCIUM 20 MG PO TABS: 20.0000 mg | ORAL_TABLET | Freq: Every day | ORAL | 6 refills | Status: DC

## 2021-05-20 NOTE — Progress Notes (Signed)
Pt was informed of results at [facility] on 05/19/21

## 2021-05-20 NOTE — Telephone Encounter (Signed)
-----   Message from Hoy Register, MD sent at 05/20/2021  9:38 AM EST ----- Cholesterol is elevated and I have sent a prescription for atorvastatin to her pharmacy and she will need to work on a low-cholesterol diet.  Other labs are stable.

## 2021-05-20 NOTE — Telephone Encounter (Signed)
Call placed to pt's daughter and VM is currently full.  Letter has been mailed to patient.

## 2021-05-23 DIAGNOSIS — F0150 Vascular dementia without behavioral disturbance: Secondary | ICD-10-CM | POA: Diagnosis not present

## 2021-05-24 DIAGNOSIS — F0150 Vascular dementia without behavioral disturbance: Secondary | ICD-10-CM | POA: Diagnosis not present

## 2021-05-25 DIAGNOSIS — F0150 Vascular dementia without behavioral disturbance: Secondary | ICD-10-CM | POA: Diagnosis not present

## 2021-05-26 DIAGNOSIS — F0150 Vascular dementia without behavioral disturbance: Secondary | ICD-10-CM | POA: Diagnosis not present

## 2021-05-30 DIAGNOSIS — F0150 Vascular dementia without behavioral disturbance: Secondary | ICD-10-CM | POA: Diagnosis not present

## 2021-05-31 DIAGNOSIS — F0150 Vascular dementia without behavioral disturbance: Secondary | ICD-10-CM | POA: Diagnosis not present

## 2021-06-01 ENCOUNTER — Telehealth: Payer: Self-pay

## 2021-06-01 DIAGNOSIS — F0150 Vascular dementia without behavioral disturbance: Secondary | ICD-10-CM | POA: Diagnosis not present

## 2021-06-01 NOTE — Telephone Encounter (Signed)
-----   Message from Hoy Register, MD sent at 05/20/2021  9:38 AM EST ----- Cholesterol is elevated and I have sent a prescription for atorvastatin to her pharmacy and she will need to work on a low-cholesterol diet.  Other labs are stable.

## 2021-06-01 NOTE — Telephone Encounter (Signed)
Mailbox is currently full. Letter has been mailed to patient.

## 2021-06-02 DIAGNOSIS — F0150 Vascular dementia without behavioral disturbance: Secondary | ICD-10-CM | POA: Diagnosis not present

## 2021-06-03 DIAGNOSIS — F0150 Vascular dementia without behavioral disturbance: Secondary | ICD-10-CM | POA: Diagnosis not present

## 2021-06-06 DIAGNOSIS — F0150 Vascular dementia without behavioral disturbance: Secondary | ICD-10-CM | POA: Diagnosis not present

## 2021-06-07 DIAGNOSIS — F0150 Vascular dementia without behavioral disturbance: Secondary | ICD-10-CM | POA: Diagnosis not present

## 2021-06-08 DIAGNOSIS — F0150 Vascular dementia without behavioral disturbance: Secondary | ICD-10-CM | POA: Diagnosis not present

## 2021-06-09 DIAGNOSIS — F0150 Vascular dementia without behavioral disturbance: Secondary | ICD-10-CM | POA: Diagnosis not present

## 2021-06-10 DIAGNOSIS — F0150 Vascular dementia without behavioral disturbance: Secondary | ICD-10-CM | POA: Diagnosis not present

## 2021-06-14 DIAGNOSIS — F0150 Vascular dementia without behavioral disturbance: Secondary | ICD-10-CM | POA: Diagnosis not present

## 2021-06-17 DIAGNOSIS — F0150 Vascular dementia without behavioral disturbance: Secondary | ICD-10-CM | POA: Diagnosis not present

## 2021-06-20 DIAGNOSIS — F0150 Vascular dementia without behavioral disturbance: Secondary | ICD-10-CM | POA: Diagnosis not present

## 2021-06-23 DIAGNOSIS — F0150 Vascular dementia without behavioral disturbance: Secondary | ICD-10-CM | POA: Diagnosis not present

## 2021-06-24 DIAGNOSIS — F0150 Vascular dementia without behavioral disturbance: Secondary | ICD-10-CM | POA: Diagnosis not present

## 2021-06-27 DIAGNOSIS — F0150 Vascular dementia without behavioral disturbance: Secondary | ICD-10-CM | POA: Diagnosis not present

## 2021-06-28 DIAGNOSIS — F0150 Vascular dementia without behavioral disturbance: Secondary | ICD-10-CM | POA: Diagnosis not present

## 2021-06-29 DIAGNOSIS — F0150 Vascular dementia without behavioral disturbance: Secondary | ICD-10-CM | POA: Diagnosis not present

## 2021-06-30 DIAGNOSIS — F0150 Vascular dementia without behavioral disturbance: Secondary | ICD-10-CM | POA: Diagnosis not present

## 2021-07-01 DIAGNOSIS — F0150 Vascular dementia without behavioral disturbance: Secondary | ICD-10-CM | POA: Diagnosis not present

## 2021-07-04 DIAGNOSIS — F0150 Vascular dementia without behavioral disturbance: Secondary | ICD-10-CM | POA: Diagnosis not present

## 2021-07-05 DIAGNOSIS — F0150 Vascular dementia without behavioral disturbance: Secondary | ICD-10-CM | POA: Diagnosis not present

## 2021-07-06 DIAGNOSIS — F0150 Vascular dementia without behavioral disturbance: Secondary | ICD-10-CM | POA: Diagnosis not present

## 2021-07-07 DIAGNOSIS — F0150 Vascular dementia without behavioral disturbance: Secondary | ICD-10-CM | POA: Diagnosis not present

## 2021-07-08 DIAGNOSIS — F0150 Vascular dementia without behavioral disturbance: Secondary | ICD-10-CM | POA: Diagnosis not present

## 2021-07-11 DIAGNOSIS — F0150 Vascular dementia without behavioral disturbance: Secondary | ICD-10-CM | POA: Diagnosis not present

## 2021-07-12 DIAGNOSIS — F0150 Vascular dementia without behavioral disturbance: Secondary | ICD-10-CM | POA: Diagnosis not present

## 2021-07-13 DIAGNOSIS — F0150 Vascular dementia without behavioral disturbance: Secondary | ICD-10-CM | POA: Diagnosis not present

## 2021-07-14 DIAGNOSIS — F0150 Vascular dementia without behavioral disturbance: Secondary | ICD-10-CM | POA: Diagnosis not present

## 2021-07-15 DIAGNOSIS — F0150 Vascular dementia without behavioral disturbance: Secondary | ICD-10-CM | POA: Diagnosis not present

## 2021-07-18 DIAGNOSIS — F0150 Vascular dementia without behavioral disturbance: Secondary | ICD-10-CM | POA: Diagnosis not present

## 2021-07-19 DIAGNOSIS — F0150 Vascular dementia without behavioral disturbance: Secondary | ICD-10-CM | POA: Diagnosis not present

## 2021-07-20 DIAGNOSIS — F0150 Vascular dementia without behavioral disturbance: Secondary | ICD-10-CM | POA: Diagnosis not present

## 2021-07-21 DIAGNOSIS — F0150 Vascular dementia without behavioral disturbance: Secondary | ICD-10-CM | POA: Diagnosis not present

## 2021-07-22 DIAGNOSIS — F0150 Vascular dementia without behavioral disturbance: Secondary | ICD-10-CM | POA: Diagnosis not present

## 2021-07-25 DIAGNOSIS — F0150 Vascular dementia without behavioral disturbance: Secondary | ICD-10-CM | POA: Diagnosis not present

## 2021-07-26 DIAGNOSIS — F0150 Vascular dementia without behavioral disturbance: Secondary | ICD-10-CM | POA: Diagnosis not present

## 2021-07-27 DIAGNOSIS — F0150 Vascular dementia without behavioral disturbance: Secondary | ICD-10-CM | POA: Diagnosis not present

## 2021-07-28 DIAGNOSIS — F0150 Vascular dementia without behavioral disturbance: Secondary | ICD-10-CM | POA: Diagnosis not present

## 2021-07-29 DIAGNOSIS — F0150 Vascular dementia without behavioral disturbance: Secondary | ICD-10-CM | POA: Diagnosis not present

## 2021-08-02 DIAGNOSIS — F0150 Vascular dementia without behavioral disturbance: Secondary | ICD-10-CM | POA: Diagnosis not present

## 2021-08-03 DIAGNOSIS — F0150 Vascular dementia without behavioral disturbance: Secondary | ICD-10-CM | POA: Diagnosis not present

## 2021-08-04 DIAGNOSIS — F0150 Vascular dementia without behavioral disturbance: Secondary | ICD-10-CM | POA: Diagnosis not present

## 2021-08-05 DIAGNOSIS — F0150 Vascular dementia without behavioral disturbance: Secondary | ICD-10-CM | POA: Diagnosis not present

## 2021-08-08 DIAGNOSIS — F0150 Vascular dementia without behavioral disturbance: Secondary | ICD-10-CM | POA: Diagnosis not present

## 2021-08-09 DIAGNOSIS — F0150 Vascular dementia without behavioral disturbance: Secondary | ICD-10-CM | POA: Diagnosis not present

## 2021-08-10 DIAGNOSIS — F0150 Vascular dementia without behavioral disturbance: Secondary | ICD-10-CM | POA: Diagnosis not present

## 2021-08-11 DIAGNOSIS — F0150 Vascular dementia without behavioral disturbance: Secondary | ICD-10-CM | POA: Diagnosis not present

## 2021-08-12 DIAGNOSIS — F0150 Vascular dementia without behavioral disturbance: Secondary | ICD-10-CM | POA: Diagnosis not present

## 2021-08-15 DIAGNOSIS — F0150 Vascular dementia without behavioral disturbance: Secondary | ICD-10-CM | POA: Diagnosis not present

## 2021-08-16 DIAGNOSIS — F0150 Vascular dementia without behavioral disturbance: Secondary | ICD-10-CM | POA: Diagnosis not present

## 2021-08-17 ENCOUNTER — Ambulatory Visit: Payer: Medicaid Other | Attending: Family Medicine | Admitting: Family Medicine

## 2021-08-17 VITALS — BP 169/97 | HR 60 | Ht 63.0 in | Wt 92.0 lb

## 2021-08-17 DIAGNOSIS — R7303 Prediabetes: Secondary | ICD-10-CM | POA: Diagnosis not present

## 2021-08-17 DIAGNOSIS — D649 Anemia, unspecified: Secondary | ICD-10-CM

## 2021-08-17 DIAGNOSIS — F0150 Vascular dementia without behavioral disturbance: Secondary | ICD-10-CM | POA: Diagnosis not present

## 2021-08-17 DIAGNOSIS — I10 Essential (primary) hypertension: Secondary | ICD-10-CM | POA: Diagnosis not present

## 2021-08-17 DIAGNOSIS — E7800 Pure hypercholesterolemia, unspecified: Secondary | ICD-10-CM

## 2021-08-17 MED ORDER — AMLODIPINE BESYLATE 10 MG PO TABS: 10.0000 mg | ORAL_TABLET | Freq: Every day | ORAL | 1 refills | Status: DC

## 2021-08-17 MED ORDER — CARVEDILOL 3.125 MG PO TABS: 3.1250 mg | ORAL_TABLET | Freq: Two times a day (BID) | ORAL | 1 refills | Status: DC

## 2021-08-17 MED ORDER — LOSARTAN POTASSIUM 25 MG PO TABS: 25.0000 mg | ORAL_TABLET | Freq: Every day | ORAL | 1 refills | Status: DC

## 2021-08-17 MED ORDER — ATORVASTATIN CALCIUM 20 MG PO TABS: 20.0000 mg | ORAL_TABLET | Freq: Every day | ORAL | 1 refills | Status: DC

## 2021-08-17 NOTE — Progress Notes (Signed)
? ?Subjective:  ?Patient ID: Erica Lopez, female    DOB: [birth year]  Age: 61 y.o. MRN: [id number] ? ?CC: Hypertension ? ? ?HPI ?Erica Lopez is a 61 y.o. 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 ?Interval History: ?Blood pressure is elevated and daughter states patient has been taking her medications as the daughter gives it to her to take. ?Blood pressure at last office visit was elevated at 179/97, 3 months ago but surprisingly was 136/81 in 10/2020. ?Daughter endorses patient is compliant with her statin. ? ?She is anemic with a hemoglobin of 10.9 from 06/2020.  Cologuard from 04/2021 was negative. ?Bleeds from her gums a lot per daughter. ?Daughter is requesting a handicap placard for her mom. ?Past Medical History:  ?Diagnosis Date  ? Hypertension   ? Stroke [facility]) 2014  ? ? ?No past surgical history on file. ? ?Family History  ?Problem Relation Age of Onset  ? Cancer Other   ? Hypertension Other   ? Stroke Other   ? Heart disease Other   ? Diabetes Other   ? ? ?Social History  ? ?Socioeconomic History  ? Marital status: Single  ?  Spouse name: Not on file  ? Number of children: Not on file  ? Years of education: Not on file  ? Highest education level: Not on file  ?Occupational History  ? Not on file  ?Tobacco Use  ? Smoking status: Never  ? Smokeless tobacco: Never  ?Substance and Sexual Activity  ? Alcohol use: No  ? Drug use: No  ? Sexual activity: Not on file  ?Other Topics Concern  ? Not on file  ?Social History Narrative  ? Not on file  ? ?Social Determinants of Health  ? ?Financial Resource Strain: Not on file  ?Food Insecurity: Not on file  ?Transportation Needs: Not on file  ?Physical Activity: Not on file  ?Stress: Not on file  ?Social Connections: Not on file  ? ? ?Allergies  ?Allergen Reactions  ? Aspirin Anaphylaxis and Other (See Comments)  ?  Bleeding event  ? Lisinopril Swelling  ? Other  Hives, Swelling and Other (See Comments)  ?  Blueberries, causing hives and swelling  ? ? ?Outpatient Medications Prior to Visit  ?Medication Sig Dispense Refill  ? triamcinolone (KENALOG) 0.025 % ointment Apply 1 application topically 2 (two) times daily. 30 g 0  ? amLODipine (NORVASC) 10 MG tablet Take 1 tablet (10 mg total) by mouth daily. 90 tablet 1  ? atorvastatin (LIPITOR) 20 MG tablet Take 1 tablet (20 mg total) by mouth daily. 30 tablet 6  ? carvedilol (COREG) 3.125 MG tablet Take 1 tablet (3.125 mg total) by mouth 2 (two) times daily with a meal. 180 tablet 1  ? ?No facility-administered medications prior to visit.  ? ? ? ?ROS ?Review of Systems  ?Constitutional:  Negative for activity change, appetite change and fatigue.  ?HENT:  Negative for congestion, sinus pressure and sore throat.   ?Eyes:  Negative for visual disturbance.  ?Respiratory:  Negative for cough, chest tightness, shortness of breath and wheezing.   ?Cardiovascular:  Negative for chest pain and palpitations.  ?Gastrointestinal:  Negative for abdominal distention, abdominal pain and constipation.  ?Endocrine: Negative for polydipsia.  ?Genitourinary:  Negative for dysuria and frequency.  ?Musculoskeletal:  Negative for arthralgias and back pain.  ?Skin:  Negative for rash.  ?Neurological:  Negative for tremors, light-headedness and  numbness.  ?Hematological:  Does not bruise/bleed easily.  ?Psychiatric/Behavioral:  Negative for agitation and behavioral problems.   ? ?Objective:  ?BP (!) 169/97   Pulse 60   Ht '5\' 3"'  (1.6 m)   Wt 92 lb (41.7 kg)   SpO2 100%   BMI 16.30 kg/m?  ? ? ?  08/17/2021  ?  2:22 PM 08/17/2021  ?  1:52 PM 05/19/2021  ? 11:06 AM  ?BP/Weight  ?Systolic BP 161 096 045  ?Diastolic BP 97 409 97  ?Wt. (Lbs)  92 95  ?BMI  16.3 kg/m2 16.83 kg/m2  ? ? ? ? ?Physical Exam ?Constitutional:   ?   Appearance: She is well-developed.  ?Cardiovascular:  ?   Rate and Rhythm: Normal rate.  ?   Heart sounds: Normal heart sounds. No  murmur heard. ?Pulmonary:  ?   Effort: Pulmonary effort is normal.  ?   Breath sounds: Normal breath sounds. No wheezing or rales.  ?Chest:  ?   Chest wall: No tenderness.  ?Abdominal:  ?   General: Bowel sounds are normal. There is no distension.  ?   Palpations: Abdomen is soft. There is no mass.  ?   Tenderness: There is no abdominal tenderness.  ?Musculoskeletal:     ?   General: Normal range of motion.  ?   Right lower leg: No edema.  ?   Left lower leg: No edema.  ?Neurological:  ?   Mental Status: She is alert. Mental status is at baseline.  ?Psychiatric:     ?   Mood and Affect: Mood normal.     ?   Judgment: Judgment is inappropriate.  ? ? ? ?  Latest Ref Rng & Units 05/19/2021  ? 11:58 AM 07/07/2020  ? 10:52 PM 06/17/2020  ?  4:16 PM  ?CMP  ?Glucose 70 - 99 mg/dL 81   212   114    ?BUN 8 - 27 mg/dL '16   23   22    ' ?Creatinine 0.57 - 1.00 mg/dL 1.12   1.37   1.26    ?Sodium 134 - 144 mmol/L 144   141   144    ?Potassium 3.5 - 5.2 mmol/L 4.2   3.9   3.5    ?Chloride 96 - 106 mmol/L 105   107   105    ?CO2 20 - 29 mmol/L '20   21   26    ' ?Calcium 8.7 - 10.3 mg/dL 9.4   8.7   9.4    ?Total Protein 6.0 - 8.5 g/dL 8.3   7.4   7.8    ?Total Bilirubin 0.0 - 1.2 mg/dL 0.3   0.9   0.2    ?Alkaline Phos 44 - 121 IU/L 64   49   79    ?AST 0 - 40 IU/L 14   47   23    ?ALT 0 - 32 IU/L 11   38   28    ? ? ?Lipid Panel  ?   ?Component Value Date/Time  ? CHOL 224 (H) 05/19/2021 1158  ? TRIG 111 05/19/2021 1158  ? HDL 79 05/19/2021 1158  ? LDLCALC 126 (H) 05/19/2021 1158  ? ? ?CBC ?   ?Component Value Date/Time  ? WBC 8.7 07/07/2020 2252  ? RBC 5.12 (H) 07/07/2020 2252  ? HGB 10.9 (L) 07/07/2020 2252  ? HGB 10.5 (L) 06/17/2020 1616  ? HCT 38.3 07/07/2020 2252  ? HCT 34.9 06/17/2020 1616  ?  PLT 231 07/07/2020 2252  ? PLT 241 06/17/2020 1616  ? MCV 74.8 (L) 07/07/2020 2252  ? MCV 72 (L) 06/17/2020 1616  ? MCH 21.3 (L) 07/07/2020 2252  ? MCHC 28.5 (L) 07/07/2020 2252  ? RDW 18.7 (H) 07/07/2020 2252  ? RDW 18.9 (H) 06/17/2020 1616   ? LYMPHSABS 1.1 07/07/2020 2252  ? LYMPHSABS 0.9 06/17/2020 1616  ? MONOABS 0.5 07/07/2020 2252  ? EOSABS 0.0 07/07/2020 2252  ? EOSABS 0.0 06/17/2020 1616  ? BASOSABS 0.0 07/07/2020 2252  ? BASOSABS 0.0 06/17/2020 1616  ? ? ?Lab Results  ?Component Value Date  ? HGBA1C 5.9 (H) 06/17/2020  ? ? ?Assessment & Plan:  ?1. Essential hypertension ?Uncontrolled ?Losartan added to regimen ?She will follow-up with the clinical pharmacist next week to reassess BP and also check potassium level due to initiation of ARB and for second dose of Shingrix ?Counseled on blood pressure goal of less than 130/80, low-sodium, DASH diet, medication compliance, 150 minutes of moderate intensity exercise per week. ?Discussed medication compliance, adverse effects. ?- losartan (COZAAR) 25 MG tablet; Take 1 tablet (25 mg total) by mouth daily.  Dispense: 90 tablet; Refill: 1 ?- amLODipine (NORVASC) 10 MG tablet; Take 1 tablet (10 mg total) by mouth daily.  Dispense: 90 tablet; Refill: 1 ?- carvedilol (COREG) 3.125 MG tablet; Take 1 tablet (3.125 mg total) by mouth 2 (two) times daily with a meal.  Dispense: 180 tablet; Refill: 1 ?- CMP14+EGFR ? ?2. Vascular dementia without behavioral disturbance ([place]) ?Stable ?Risk factor modification ? ?3. Anemia, unspecified type ?Last hemoglobin was 10.9 ?Colorectal cancer screen is negative ?We will check CBC and ?- CBC with Differential/Platelet ? ?4. Pure hypercholesterolemia ?Continue with statin ?Lipid panel at next visit ?Low-cholesterol diet ?- atorvastatin (LIPITOR) 20 MG tablet; Take 1 tablet (20 mg total) by mouth daily.  Dispense: 90 tablet; Refill: 1 ? ? ?She is requesting a handicap form but she does not meet the criteria for a handicap placard. ? ?Health Care Maintenance: Up-to-date on colorectal cancer screening, mammogram was ordered at the last visit but she is yet to undergo this.  Shingrix at next visit. ?Meds ordered this encounter  ?Medications  ? losartan (COZAAR) 25 MG tablet  ?   Sig: Take 1 tablet (25 mg total) by mouth daily.  ?  Dispense:  90 tablet  ?  Refill:  1  ? amLODipine (NORVASC) 10 MG tablet  ?  Sig: Take 1 tablet (10 mg total) by mouth daily.  ?  Dispense:  90 tablet  ?  Refi

## 2021-08-17 NOTE — Patient Instructions (Signed)
Managing Your Hypertension Hypertension, also called high blood pressure, is when the force of the blood pressing against the walls of the arteries is too strong. Arteries are blood vessels that carry blood from your heart throughout your body. Hypertension forces the heart to work harder to pump blood and may cause the arteries to become narrow or stiff. Understanding blood pressure readings Your personal target blood pressure may vary depending on your medical conditions, your age, and other factors. A blood pressure reading includes a higher number over a lower number. Ideally, your blood pressure should be below 120/80. You should know that: The first, or top, number is called the systolic pressure. It is a measure of the pressure in your arteries as your heart beats. The second, or bottom number, is called the diastolic pressure. It is a measure of the pressure in your arteries as the heart relaxes. Blood pressure is classified into four stages. Based on your blood pressure reading, your health care provider may use the following stages to determine what type of treatment you need, if any. Systolic pressure and diastolic pressure are measured in a unit called mmHg. Normal Systolic pressure: below 120. Diastolic pressure: below 80. Elevated Systolic pressure: 120-129. Diastolic pressure: below 80. Hypertension stage 1 Systolic pressure: 130-139. Diastolic pressure: 80-89. Hypertension stage 2 Systolic pressure: 140 or above. Diastolic pressure: 90 or above. How can this condition affect me? Managing your hypertension is an important responsibility. Over time, hypertension can damage the arteries and decrease blood flow to important parts of the body, including the brain, heart, and kidneys. Having untreated or uncontrolled hypertension can lead to: A heart attack. A stroke. A weakened blood vessel (aneurysm). Heart failure. Kidney damage. Eye damage. Metabolic syndrome. Memory and  concentration problems. Vascular dementia. What actions can I take to manage this condition? Hypertension can be managed by making lifestyle changes and possibly by taking medicines. Your health care provider will help you make a plan to bring your blood pressure within a normal range. Nutrition  Eat a diet that is high in fiber and potassium, and low in salt (sodium), added sugar, and fat. An example eating plan is called the Dietary Approaches to Stop Hypertension (DASH) diet. To eat this way: Eat plenty of fresh fruits and vegetables. Try to fill one-half of your plate at each meal with fruits and vegetables. Eat whole grains, such as whole-wheat pasta, brown rice, or whole-grain bread. Fill about one-fourth of your plate with whole grains. Eat low-fat dairy products. Avoid fatty cuts of meat, processed or cured meats, and poultry with skin. Fill about one-fourth of your plate with lean proteins such as fish, chicken without skin, beans, eggs, and tofu. Avoid pre-made and processed foods. These tend to be higher in sodium, added sugar, and fat. Reduce your daily sodium intake. Most people with hypertension should eat less than 1,500 mg of sodium a day. Lifestyle  Work with your health care provider to maintain a healthy body weight or to lose weight. Ask what an ideal weight is for you. Get at least 30 minutes of exercise that causes your heart to beat faster (aerobic exercise) most days of the week. Activities may include walking, swimming, or biking. Include exercise to strengthen your muscles (resistance exercise), such as weight lifting, as part of your weekly exercise routine. Try to do these types of exercises for 30 minutes at least 3 days a week. Do not use any products that contain nicotine or tobacco, such as cigarettes, e-cigarettes,   and chewing tobacco. If you need help quitting, ask your health care provider. Control any long-term (chronic) conditions you have, such as high  cholesterol or diabetes. Identify your sources of stress and find ways to manage stress. This may include meditation, deep breathing, or making time for fun activities. Alcohol use Do not drink alcohol if: Your health care provider tells you not to drink. You are pregnant, may be pregnant, or are planning to become pregnant. If you drink alcohol: Limit how much you use to: 0-1 drink a day for women. 0-2 drinks a day for men. Be aware of how much alcohol is in your drink. In the [place] one drink equals one 12 oz bottle of beer (355 mL), one 5 oz glass of wine (148 mL), or one 1 oz glass of hard liquor (44 mL). Medicines Your health care provider may prescribe medicine if lifestyle changes are not enough to get your blood pressure under control and if: Your systolic blood pressure is 130 or higher. Your diastolic blood pressure is 80 or higher. Take medicines only as told by your health care provider. Follow the directions carefully. Blood pressure medicines must be taken as told by your health care provider. The medicine does not work as well when you skip doses. Skipping doses also puts you at risk for problems. Monitoring Before you monitor your blood pressure: Do not smoke, drink caffeinated beverages, or exercise within 30 minutes before taking a measurement. Use the bathroom and empty your bladder (urinate). Sit quietly for at least 5 minutes before taking measurements. Monitor your blood pressure at home as told by your health care provider. To do this: Sit with your back straight and supported. Place your feet flat on the floor. Do not cross your legs. Support your arm on a flat surface, such as a table. Make sure your upper arm is at heart level. Each time you measure, take two or three readings one minute apart and record the results. You may also need to have your blood pressure checked regularly by your health care provider. General information Talk with your health care  provider about your diet, exercise habits, and other lifestyle factors that may be contributing to hypertension. Review all the medicines you take with your health care provider because there may be side effects or interactions. Keep all visits as told by your health care provider. Your health care provider can help you create and adjust your plan for managing your high blood pressure. Where to find more information National Heart, Lung, and Blood Institute: www.nhlbi.nih.gov American Heart Association: www.heart.org Contact a health care provider if: You think you are having a reaction to medicines you have taken. You have repeated (recurrent) headaches. You feel dizzy. You have swelling in your ankles. You have trouble with your vision. Get help right away if: You develop a severe headache or confusion. You have unusual weakness or numbness, or you feel faint. You have severe pain in your chest or abdomen. You vomit repeatedly. You have trouble breathing. These symptoms may represent a serious problem that is an emergency. Do not wait to see if the symptoms will go away. Get medical help right away. Call your local emergency services (911 in the [place] Do not drive yourself to the hospital. Summary Hypertension is when the force of blood pumping through your arteries is too strong. If this condition is not controlled, it may put you at risk for serious complications. Your personal target blood pressure may vary depending on   your medical conditions, your age, and other factors. For most people, a normal blood pressure is less than 120/80. Hypertension is managed by lifestyle changes, medicines, or both. Lifestyle changes to help manage hypertension include losing weight, eating a healthy, low-sodium diet, exercising more, stopping smoking, and limiting alcohol. This information is not intended to replace advice given to you by your health care provider. Make sure you discuss any questions  you have with your health care provider. Document Revised: 05/26/2019 Document Reviewed: 04/08/2019 Elsevier Patient Education  2022 Elsevier Inc.  

## 2021-08-18 DIAGNOSIS — F0150 Vascular dementia without behavioral disturbance: Secondary | ICD-10-CM | POA: Diagnosis not present

## 2021-08-18 LAB — CBC WITH DIFFERENTIAL/PLATELET
Basophils Absolute: 0 10*3/uL (ref 0.0–0.2)
Basos: 1 %
EOS (ABSOLUTE): 0 10*3/uL (ref 0.0–0.4)
Eos: 1 %
Hematocrit: 42.9 % (ref 34.0–46.6)
Hemoglobin: 12.6 g/dL (ref 11.1–15.9)
Immature Grans (Abs): 0 10*3/uL (ref 0.0–0.1)
Immature Granulocytes: 0 %
Lymphocytes Absolute: 1.2 10*3/uL (ref 0.7–3.1)
Lymphs: 32 %
MCH: 21.5 pg — ABNORMAL LOW (ref 26.6–33.0)
MCHC: 29.4 g/dL — ABNORMAL LOW (ref 31.5–35.7)
MCV: 73 fL — ABNORMAL LOW (ref 79–97)
Monocytes Absolute: 0.3 10*3/uL (ref 0.1–0.9)
Monocytes: 7 %
Neutrophils Absolute: 2.2 10*3/uL (ref 1.4–7.0)
Neutrophils: 59 %
Platelets: 232 10*3/uL (ref 150–450)
RBC: 5.87 x10E6/uL — ABNORMAL HIGH (ref 3.77–5.28)
RDW: 18 % — ABNORMAL HIGH (ref 11.7–15.4)
WBC: 3.6 10*3/uL (ref 3.4–10.8)

## 2021-08-18 LAB — CMP14+EGFR
ALT: 12 IU/L (ref 0–32)
AST: 13 IU/L (ref 0–40)
Albumin/Globulin Ratio: 1.2 (ref 1.2–2.2)
Albumin: 4.4 g/dL (ref 3.8–4.8)
Alkaline Phosphatase: 79 IU/L (ref 44–121)
BUN/Creatinine Ratio: 12 (ref 12–28)
BUN: 14 mg/dL (ref 8–27)
Bilirubin Total: 0.3 mg/dL (ref 0.0–1.2)
CO2: 27 mmol/L (ref 20–29)
Calcium: 10.1 mg/dL (ref 8.7–10.3)
Chloride: 108 mmol/L — ABNORMAL HIGH (ref 96–106)
Creatinine, Ser: 1.2 mg/dL — ABNORMAL HIGH (ref 0.57–1.00)
Globulin, Total: 3.6 g/dL (ref 1.5–4.5)
Glucose: 127 mg/dL — ABNORMAL HIGH (ref 70–99)
Potassium: 4.1 mmol/L (ref 3.5–5.2)
Sodium: 148 mmol/L — ABNORMAL HIGH (ref 134–144)
Total Protein: 8 g/dL (ref 6.0–8.5)
eGFR: 51 mL/min/{1.73_m2} — ABNORMAL LOW (ref >59)

## 2021-08-18 LAB — HEMOGLOBIN A1C
Est. average glucose Bld gHb Est-mCnc: 117 mg/dL
Hgb A1c MFr Bld: 5.7 % — ABNORMAL HIGH (ref 4.8–5.6)

## 2021-08-19 DIAGNOSIS — F0150 Vascular dementia without behavioral disturbance: Secondary | ICD-10-CM | POA: Diagnosis not present

## 2021-08-22 DIAGNOSIS — F0150 Vascular dementia without behavioral disturbance: Secondary | ICD-10-CM | POA: Diagnosis not present

## 2021-08-23 DIAGNOSIS — F0150 Vascular dementia without behavioral disturbance: Secondary | ICD-10-CM | POA: Diagnosis not present

## 2021-08-24 DIAGNOSIS — F0150 Vascular dementia without behavioral disturbance: Secondary | ICD-10-CM | POA: Diagnosis not present

## 2021-08-25 DIAGNOSIS — F0150 Vascular dementia without behavioral disturbance: Secondary | ICD-10-CM | POA: Diagnosis not present

## 2021-08-29 DIAGNOSIS — F0150 Vascular dementia without behavioral disturbance: Secondary | ICD-10-CM | POA: Diagnosis not present

## 2021-08-30 DIAGNOSIS — F0150 Vascular dementia without behavioral disturbance: Secondary | ICD-10-CM | POA: Diagnosis not present

## 2021-08-31 DIAGNOSIS — F0150 Vascular dementia without behavioral disturbance: Secondary | ICD-10-CM | POA: Diagnosis not present

## 2021-09-01 DIAGNOSIS — F0150 Vascular dementia without behavioral disturbance: Secondary | ICD-10-CM | POA: Diagnosis not present

## 2021-09-02 DIAGNOSIS — F0150 Vascular dementia without behavioral disturbance: Secondary | ICD-10-CM | POA: Diagnosis not present

## 2021-09-05 DIAGNOSIS — F0150 Vascular dementia without behavioral disturbance: Secondary | ICD-10-CM | POA: Diagnosis not present

## 2021-09-06 ENCOUNTER — Ambulatory Visit: Payer: Medicaid Other | Attending: Family Medicine | Admitting: Pharmacist

## 2021-09-06 VITALS — BP 122/77

## 2021-09-06 DIAGNOSIS — I10 Essential (primary) hypertension: Secondary | ICD-10-CM

## 2021-09-06 DIAGNOSIS — F0150 Vascular dementia without behavioral disturbance: Secondary | ICD-10-CM | POA: Diagnosis not present

## 2021-09-06 NOTE — Progress Notes (Signed)
? ?  S:    ?PCP: Dr. Alvis Lemmings  ? ?No chief complaint on file. ? ?Erica Lopez is a 61 y.o. female who presents for hypertension evaluation, education, and management. PMH is significant for HTN, vascular dementia, history of CVA, malnutrition. Patient was referred and last seen by Primary Care Provider, Dr. Alvis Lemmings, on 08/17/2021. Losartan was added at that visit. Pt was also instructed to come back to receive her 2nd dose of Shingrix today.  ? ?Today, patient arrives in good spirits and presents with assistance. Her daughter and granddaughter are with her. Denies dizziness, headache, blurred vision, swelling. Denies any side effects to the losartan.  ? ?Patient reports hypertension is longstanding.  ? ?Family/Social history:  ?-Fhx: HTN, stroke, heart disease, DM ?-Tobacco: never smoker  ?-Alcohol: denies use  ? ?Medication adherence reported. Patient has taken BP medications today.  ? ?Current antihypertensives include: losartan 25 mg daily ? ?Reported home BP readings: none since addition of losartan  ? ?Patient reported dietary habits:  ?-Compliant with sodium restriction, denies excessive intake of caffeine  ?-Denies eating take out or fast food  ? ?Patient-reported exercise habits:  ?-None outside of house work  ? ?O:  ?Vitals:  ? 09/06/21 1509  ?BP: 122/77  ? ?Last 3 Office BP readings: ?BP Readings from Last 3 Encounters:  ?09/06/21 122/77  ?08/17/21 (!) 169/97  ?05/19/21 (!) 179/97  ? ? ?BMET ?   ?Component Value Date/Time  ? NA 148 (H) 08/17/2021 1428  ? K 4.1 08/17/2021 1428  ? CL 108 (H) 08/17/2021 1428  ? CO2 27 08/17/2021 1428  ? GLUCOSE 127 (H) 08/17/2021 1428  ? GLUCOSE 212 (H) 07/07/2020 2252  ? BUN 14 08/17/2021 1428  ? CREATININE 1.20 (H) 08/17/2021 1428  ? CREATININE 0.98 08/10/2016 0944  ? CALCIUM 10.1 08/17/2021 1428  ? GFRNONAA 44 (L) 07/07/2020 2252  ? GFRAA 54 (L) 06/17/2020 1616  ? ? ?Renal function: ?CrCl cannot be calculated (Unknown ideal weight.). ? ?Clinical ASCVD: Yes  ? ?A/P: ?Hypertension  longstanding currently at goal on current medications. BP goal < 130/80 mmHg. Medication adherence appears appropriate.  ?-Continued losartan 25mg  daily.  ?-Patient educated on purpose, proper use, and potential adverse effects of losartan.  ?-F/u labs ordered - BMP. ?-Counseled on lifestyle modifications for blood pressure control including reduced dietary sodium, increased exercise, adequate sleep. ?-Encouraged patient to check BP at home and bring log of readings to next visit. Counseled on proper use of home BP cuff. ?-HM: pt declined Shingrix but is agreeable to getting this in 1 month.  ? ?Results reviewed and written information provided. Patient verbalized understanding of treatment plan. Total time in face-to-face counseling 20 minutes.  ? ?F/u clinic visit in 3-4 weeks. ? ?Marland Kitchen, PharmD, BCACP, CPP ?Clinical Pharmacist ?[facility] ?[phone] ? ? ?

## 2021-09-07 DIAGNOSIS — F0150 Vascular dementia without behavioral disturbance: Secondary | ICD-10-CM | POA: Diagnosis not present

## 2021-09-07 LAB — BASIC METABOLIC PANEL
BUN/Creatinine Ratio: 18 (ref 12–28)
BUN: 20 mg/dL (ref 8–27)
CO2: 21 mmol/L (ref 20–29)
Calcium: 10.1 mg/dL (ref 8.7–10.3)
Chloride: 103 mmol/L (ref 96–106)
Creatinine, Ser: 1.09 mg/dL — ABNORMAL HIGH (ref 0.57–1.00)
Glucose: 120 mg/dL — ABNORMAL HIGH (ref 70–99)
Potassium: 4.4 mmol/L (ref 3.5–5.2)
Sodium: 143 mmol/L (ref 134–144)
eGFR: 58 mL/min/{1.73_m2} — ABNORMAL LOW (ref >59)

## 2021-09-08 DIAGNOSIS — F0150 Vascular dementia without behavioral disturbance: Secondary | ICD-10-CM | POA: Diagnosis not present

## 2021-09-09 ENCOUNTER — Telehealth: Payer: Self-pay

## 2021-09-09 DIAGNOSIS — F0150 Vascular dementia without behavioral disturbance: Secondary | ICD-10-CM | POA: Diagnosis not present

## 2021-09-09 NOTE — Telephone Encounter (Signed)
Pt's daughter given results per notes of Dr. Alvis Lemmings on 09/09/21. [name] verbalized understanding. ?

## 2021-09-12 DIAGNOSIS — F0150 Vascular dementia without behavioral disturbance: Secondary | ICD-10-CM | POA: Diagnosis not present

## 2021-09-13 DIAGNOSIS — F0150 Vascular dementia without behavioral disturbance: Secondary | ICD-10-CM | POA: Diagnosis not present

## 2021-09-19 DIAGNOSIS — F0150 Vascular dementia without behavioral disturbance: Secondary | ICD-10-CM | POA: Diagnosis not present

## 2021-09-20 DIAGNOSIS — F0150 Vascular dementia without behavioral disturbance: Secondary | ICD-10-CM | POA: Diagnosis not present

## 2021-09-21 DIAGNOSIS — F0150 Vascular dementia without behavioral disturbance: Secondary | ICD-10-CM | POA: Diagnosis not present

## 2021-09-22 DIAGNOSIS — F0150 Vascular dementia without behavioral disturbance: Secondary | ICD-10-CM | POA: Diagnosis not present

## 2021-09-23 DIAGNOSIS — F0150 Vascular dementia without behavioral disturbance: Secondary | ICD-10-CM | POA: Diagnosis not present

## 2021-09-26 DIAGNOSIS — F0150 Vascular dementia without behavioral disturbance: Secondary | ICD-10-CM | POA: Diagnosis not present

## 2021-09-27 DIAGNOSIS — F0150 Vascular dementia without behavioral disturbance: Secondary | ICD-10-CM | POA: Diagnosis not present

## 2021-09-28 DIAGNOSIS — F0150 Vascular dementia without behavioral disturbance: Secondary | ICD-10-CM | POA: Diagnosis not present

## 2021-09-29 DIAGNOSIS — F0150 Vascular dementia without behavioral disturbance: Secondary | ICD-10-CM | POA: Diagnosis not present

## 2021-09-30 DIAGNOSIS — F0150 Vascular dementia without behavioral disturbance: Secondary | ICD-10-CM | POA: Diagnosis not present

## 2021-10-03 DIAGNOSIS — F0150 Vascular dementia without behavioral disturbance: Secondary | ICD-10-CM | POA: Diagnosis not present

## 2021-10-04 DIAGNOSIS — F0150 Vascular dementia without behavioral disturbance: Secondary | ICD-10-CM | POA: Diagnosis not present

## 2021-10-05 DIAGNOSIS — F0150 Vascular dementia without behavioral disturbance: Secondary | ICD-10-CM | POA: Diagnosis not present

## 2021-10-06 ENCOUNTER — Telehealth: Payer: Self-pay | Admitting: Pharmacist

## 2021-10-06 ENCOUNTER — Encounter: Payer: Self-pay | Admitting: Pharmacist

## 2021-10-06 ENCOUNTER — Ambulatory Visit: Payer: Medicaid Other | Attending: Family Medicine | Admitting: Pharmacist

## 2021-10-06 VITALS — BP 118/78

## 2021-10-06 DIAGNOSIS — I10 Essential (primary) hypertension: Secondary | ICD-10-CM

## 2021-10-06 DIAGNOSIS — F0150 Vascular dementia without behavioral disturbance: Secondary | ICD-10-CM | POA: Diagnosis not present

## 2021-10-06 NOTE — Telephone Encounter (Signed)
Please see the end of my plan from her last visit regarding this. She had asked me at her last visit and I already responded to her. In 2019 she did qualify but the good news is that she has improved to the point that she no longer needs a handicap form. Exercise is good for her and she needs to walk.

## 2021-10-06 NOTE — Telephone Encounter (Signed)
Patient was recently denied a handicap placard. Her daughter requests clarification/reasoning for the denial. She states that she was approved for this in 2019. I told her I would forward her question to her PCP.

## 2021-10-06 NOTE — Progress Notes (Signed)
   S:    PCP: Dr. Alvis Lemmings   No chief complaint on file.  Erica Lopez is a 61 y.o. female who presents for hypertension evaluation, education, and management. PMH is significant for HTN, vascular dementia, history of CVA, malnutrition. Patient was referred and last seen by Primary Care Provider, Dr. Alvis Lemmings, on 08/17/2021. Losartan was added at that visit. I saw her on 09/06/21 and her BP was good! I wanted to make sure this reading was accurate so I instructed her to return today. She declined her Shingrix vaccine and tells me that she does not want this again today.   Today, patient arrives in good spirits and presents with assistance. Her daughter is with her. She denies dizziness, headache, blurred vision, swelling. Denies any side effects to the losartan.   Patient reports hypertension is longstanding.   Family/Social history:  -Fhx: HTN, stroke, heart disease, DM -Tobacco: never smoker  -Alcohol: denies use   Medication adherence reported. Patient has taken BP medications today.   Current antihypertensives include: losartan 25 mg daily  Reported home BP readings: none since addition of losartan   Patient reported dietary habits:  -Compliant with sodium restriction, denies excessive intake of caffeine  -Denies eating take out or fast food   Patient-reported exercise habits:  -None outside of house work   O:  Vitals:   10/06/21 1501  BP: 118/78    Last 3 Office BP readings: BP Readings from Last 3 Encounters:  10/06/21 118/78  09/06/21 122/77  08/17/21 (!) 169/97    BMET    Component Value Date/Time   NA 143 09/06/2021 1525   K 4.4 09/06/2021 1525   CL 103 09/06/2021 1525   CO2 21 09/06/2021 1525   GLUCOSE 120 (H) 09/06/2021 1525   GLUCOSE 212 (H) 07/07/2020 2252   BUN 20 09/06/2021 1525   CREATININE 1.09 (H) 09/06/2021 1525   CREATININE 0.98 08/10/2016 0944   CALCIUM 10.1 09/06/2021 1525   GFRNONAA 44 (L) 07/07/2020 2252   GFRAA 54 (L) 06/17/2020 1616     Renal function: CrCl cannot be calculated (Patient's most recent lab result is older than the maximum 21 days allowed.).  Clinical ASCVD: Yes   A/P: Hypertension longstanding currently at goal on current medications. BP goal < 130/80 mmHg. Medication adherence appears appropriate.  -Continued losartan 25mg  daily.  -Patient educated on purpose, proper use, and potential adverse effects of losartan.  -F/u labs ordered - none. -Counseled on lifestyle modifications for blood pressure control including reduced dietary sodium, increased exercise, adequate sleep. -Encouraged patient to check BP at home and bring log of readings to next visit. Counseled on proper use of home BP cuff. . -HM: pt declined Shingrix.  Results reviewed and written information provided. Patient verbalized understanding of treatment plan. Total time in face-to-face counseling 20 minutes.   F/u clinic visit with PCP in July.   August, PharmD, Butch Penny, CPP Clinical Pharmacist [facility] [phone]

## 2021-10-07 DIAGNOSIS — F0150 Vascular dementia without behavioral disturbance: Secondary | ICD-10-CM | POA: Diagnosis not present

## 2021-10-10 DIAGNOSIS — F0150 Vascular dementia without behavioral disturbance: Secondary | ICD-10-CM | POA: Diagnosis not present

## 2021-10-11 DIAGNOSIS — F0150 Vascular dementia without behavioral disturbance: Secondary | ICD-10-CM | POA: Diagnosis not present

## 2021-10-12 DIAGNOSIS — F0150 Vascular dementia without behavioral disturbance: Secondary | ICD-10-CM | POA: Diagnosis not present

## 2021-10-13 DIAGNOSIS — F0150 Vascular dementia without behavioral disturbance: Secondary | ICD-10-CM | POA: Diagnosis not present

## 2021-10-14 DIAGNOSIS — F0150 Vascular dementia without behavioral disturbance: Secondary | ICD-10-CM | POA: Diagnosis not present

## 2021-10-17 DIAGNOSIS — F0150 Vascular dementia without behavioral disturbance: Secondary | ICD-10-CM | POA: Diagnosis not present

## 2021-10-18 DIAGNOSIS — F0150 Vascular dementia without behavioral disturbance: Secondary | ICD-10-CM | POA: Diagnosis not present

## 2021-10-19 DIAGNOSIS — F0150 Vascular dementia without behavioral disturbance: Secondary | ICD-10-CM | POA: Diagnosis not present

## 2021-10-20 DIAGNOSIS — F0150 Vascular dementia without behavioral disturbance: Secondary | ICD-10-CM | POA: Diagnosis not present

## 2021-10-21 DIAGNOSIS — F0150 Vascular dementia without behavioral disturbance: Secondary | ICD-10-CM | POA: Diagnosis not present

## 2021-10-24 DIAGNOSIS — F0150 Vascular dementia without behavioral disturbance: Secondary | ICD-10-CM | POA: Diagnosis not present

## 2021-10-25 DIAGNOSIS — F0150 Vascular dementia without behavioral disturbance: Secondary | ICD-10-CM | POA: Diagnosis not present

## 2021-10-26 DIAGNOSIS — F0150 Vascular dementia without behavioral disturbance: Secondary | ICD-10-CM | POA: Diagnosis not present

## 2021-10-27 DIAGNOSIS — F0150 Vascular dementia without behavioral disturbance: Secondary | ICD-10-CM | POA: Diagnosis not present

## 2021-10-28 DIAGNOSIS — F0150 Vascular dementia without behavioral disturbance: Secondary | ICD-10-CM | POA: Diagnosis not present

## 2021-10-31 DIAGNOSIS — F0150 Vascular dementia without behavioral disturbance: Secondary | ICD-10-CM | POA: Diagnosis not present

## 2021-11-01 DIAGNOSIS — F0150 Vascular dementia without behavioral disturbance: Secondary | ICD-10-CM | POA: Diagnosis not present

## 2021-11-02 DIAGNOSIS — F0150 Vascular dementia without behavioral disturbance: Secondary | ICD-10-CM | POA: Diagnosis not present

## 2021-11-03 DIAGNOSIS — F0150 Vascular dementia without behavioral disturbance: Secondary | ICD-10-CM | POA: Diagnosis not present

## 2021-11-04 DIAGNOSIS — F0150 Vascular dementia without behavioral disturbance: Secondary | ICD-10-CM | POA: Diagnosis not present

## 2021-11-07 DIAGNOSIS — F0150 Vascular dementia without behavioral disturbance: Secondary | ICD-10-CM | POA: Diagnosis not present

## 2021-11-08 DIAGNOSIS — F0150 Vascular dementia without behavioral disturbance: Secondary | ICD-10-CM | POA: Diagnosis not present

## 2021-11-09 DIAGNOSIS — F0150 Vascular dementia without behavioral disturbance: Secondary | ICD-10-CM | POA: Diagnosis not present

## 2021-11-10 DIAGNOSIS — F0150 Vascular dementia without behavioral disturbance: Secondary | ICD-10-CM | POA: Diagnosis not present

## 2021-11-11 DIAGNOSIS — F0150 Vascular dementia without behavioral disturbance: Secondary | ICD-10-CM | POA: Diagnosis not present

## 2021-11-14 DIAGNOSIS — F0150 Vascular dementia without behavioral disturbance: Secondary | ICD-10-CM | POA: Diagnosis not present

## 2021-11-15 DIAGNOSIS — F0150 Vascular dementia without behavioral disturbance: Secondary | ICD-10-CM | POA: Diagnosis not present

## 2021-11-16 DIAGNOSIS — F0150 Vascular dementia without behavioral disturbance: Secondary | ICD-10-CM | POA: Diagnosis not present

## 2021-11-17 DIAGNOSIS — F0150 Vascular dementia without behavioral disturbance: Secondary | ICD-10-CM | POA: Diagnosis not present

## 2021-11-18 DIAGNOSIS — F0150 Vascular dementia without behavioral disturbance: Secondary | ICD-10-CM | POA: Diagnosis not present

## 2021-11-21 DIAGNOSIS — F0150 Vascular dementia without behavioral disturbance: Secondary | ICD-10-CM | POA: Diagnosis not present

## 2021-11-23 DIAGNOSIS — F0150 Vascular dementia without behavioral disturbance: Secondary | ICD-10-CM | POA: Diagnosis not present

## 2021-11-24 DIAGNOSIS — F0150 Vascular dementia without behavioral disturbance: Secondary | ICD-10-CM | POA: Diagnosis not present

## 2021-11-25 DIAGNOSIS — F0150 Vascular dementia without behavioral disturbance: Secondary | ICD-10-CM | POA: Diagnosis not present

## 2021-11-28 ENCOUNTER — Ambulatory Visit: Payer: Medicaid Other | Attending: Family Medicine | Admitting: Family Medicine

## 2021-11-28 ENCOUNTER — Encounter: Payer: Self-pay | Admitting: Family Medicine

## 2021-11-28 VITALS — BP 134/83 | HR 82 | Temp 98.4°F | Ht 63.0 in | Wt 93.6 lb

## 2021-11-28 DIAGNOSIS — R0609 Other forms of dyspnea: Secondary | ICD-10-CM | POA: Diagnosis not present

## 2021-11-28 DIAGNOSIS — R600 Localized edema: Secondary | ICD-10-CM | POA: Diagnosis not present

## 2021-11-28 DIAGNOSIS — F0150 Vascular dementia without behavioral disturbance: Secondary | ICD-10-CM | POA: Diagnosis not present

## 2021-11-28 DIAGNOSIS — E7800 Pure hypercholesterolemia, unspecified: Secondary | ICD-10-CM | POA: Diagnosis not present

## 2021-11-28 DIAGNOSIS — I10 Essential (primary) hypertension: Secondary | ICD-10-CM | POA: Diagnosis not present

## 2021-11-28 MED ORDER — AMLODIPINE BESYLATE 10 MG PO TABS: 10.0000 mg | ORAL_TABLET | Freq: Every day | ORAL | 1 refills | Status: DC

## 2021-11-28 MED ORDER — LOSARTAN POTASSIUM 25 MG PO TABS: 25.0000 mg | ORAL_TABLET | Freq: Every day | ORAL | 1 refills | Status: DC

## 2021-11-28 MED ORDER — ATORVASTATIN CALCIUM 20 MG PO TABS: 20.0000 mg | ORAL_TABLET | Freq: Every day | ORAL | 1 refills | Status: DC

## 2021-11-28 NOTE — Patient Instructions (Signed)
Edema ? ?Edema is when you have too much fluid in your body or under your skin. Edema may make your legs, feet, and ankles swell. Swelling often happens in looser tissues, such as around your eyes. This is a common condition. It gets more common as you get older. ?There are many possible causes of edema. These include: ?Eating too much salt (sodium). ?Being on your feet or sitting for a long time. ?Certain medical conditions, such as: ?Pregnancy. ?Heart failure. ?Liver disease. ?Kidney disease. ?Cancer. ?Hot weather may make edema worse. Edema is usually painless. Your skin may look swollen or shiny. ?Follow these instructions at home: ?Medicines ?Take over-the-counter and prescription medicines only as told by your doctor. ?Your doctor may prescribe a medicine to help your body get rid of extra water (diuretic). Take this medicine if you are told to take it. ?Eating and drinking ?Eat a low-salt (low-sodium) diet as told by your doctor. Sometimes, eating less salt may reduce swelling. ?Depending on the cause of your swelling, you may need to limit how much fluid you drink (fluid restriction). ?General instructions ?Raise the injured area above the level of your heart while you are sitting or lying down. ?Do not sit still or stand for a long time. ?Do not wear tight clothes. Do not wear garters on your upper legs. ?Exercise your legs. This can help the swelling go down. ?Wear compression stockings as told by your doctor. It is important that these are the right size. These should be prescribed by your doctor to prevent possible injuries. ?If elastic bandages or wraps are recommended, use them as told by your doctor. ?Contact a doctor if: ?Treatment is not working. ?You have heart, liver, or kidney disease and have symptoms of edema. ?You have sudden and unexplained weight gain. ?Get help right away if: ?You have shortness of breath or chest pain. ?You cannot breathe when you lie down. ?You have pain, redness, or  warmth in the swollen areas. ?You have heart, liver, or kidney disease and get edema all of a sudden. ?You have a fever and your symptoms get worse all of a sudden. ?These symptoms may be an emergency. Get help right away. Call 911. ?Do not wait to see if the symptoms will go away. ?Do not drive yourself to the hospital. ?Summary ?Edema is when you have too much fluid in your body or under your skin. ?Edema may make your legs, feet, and ankles swell. Swelling often happens in looser tissues, such as around your eyes. ?Raise the injured area above the level of your heart while you are sitting or lying down. ?Follow your doctor's instructions about diet and how much fluid you can drink. ?This information is not intended to replace advice given to you by your health care provider. Make sure you discuss any questions you have with your health care provider. ?Document Revised: 01/10/2021 Document Reviewed: 01/10/2021 ?Elsevier Patient Education ? 2023 Elsevier Inc. ? ?

## 2021-11-28 NOTE — Progress Notes (Signed)
Discuss need for handicap placard.

## 2021-11-28 NOTE — Progress Notes (Signed)
Subjective:  Patient ID: Erica Lopez, female    DOB: July 31, 1960  Age: 61 y.o. MRN: [id number]  CC: Hypertension   HPI Sindia Kowalczyk is a 61 y.o. 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 Endorses adherence with her antihypertensives and her statin and her blood pressure is controlled. At her last visit she had requested a handicap form but I had declined stating she did not meet the criteria. Daughter would like her to receive a handicap form due to the fact that she has noticed her mom being dyspneic when walking long distances.  She does not have a history of CHF. On further questioning the patient she states she thinks she does have some dyspnea on waking up from her sleep but this has not been witnessed by anyone and she denies other symptoms of sleep apnea.  She has not noticed any pedal edema but I have noticed edema of the dorsum of her right foot.  Past Medical History:  Diagnosis Date   Hypertension    Stroke (HCC) 2014    No past surgical history on file.  Family History  Problem Relation Age of Onset   Cancer Other    Hypertension Other    Stroke Other    Heart disease Other    Diabetes Other     Social History   Socioeconomic History   Marital status: Single    Spouse name: Not on file   Number of children: Not on file   Years of education: Not on file   Highest education level: Not on file  Occupational History   Not on file  Tobacco Use   Smoking status: Never   Smokeless tobacco: Never  Substance and Sexual Activity   Alcohol use: No   Drug use: No   Sexual activity: Not on file  Other Topics Concern   Not on file  Social History Narrative   Not on file   Social Determinants of Health   Financial Resource Strain: Not on file  Food Insecurity: Not on  file  Transportation Needs: Not on file  Physical Activity: Not on file  Stress: Not on file  Social Connections: Not on file    Allergies  Allergen Reactions   Aspirin Anaphylaxis and Other (See Comments)    Bleeding event   Lisinopril Swelling   Other Hives, Swelling and Other (See Comments)    Blueberries, causing hives and swelling    Outpatient Medications Prior to Visit  Medication Sig Dispense Refill   carvedilol (COREG) 3.125 MG tablet Take 1 tablet (3.125 mg total) by mouth 2 (two) times daily with a meal. 180 tablet 1   amLODipine (NORVASC) 10 MG tablet Take 1 tablet (10 mg total) by mouth daily. 90 tablet 1   atorvastatin (LIPITOR) 20 MG tablet Take 1 tablet (20 mg total) by mouth daily. 90 tablet 1   losartan (COZAAR) 25 MG tablet Take 1 tablet (25 mg total) by mouth daily. 90 tablet 1   triamcinolone (KENALOG) 0.025 % ointment Apply 1 application topically 2 (two) times daily. (Patient not taking: Reported on 11/28/2021) 30 g 0   No facility-administered medications prior to visit.     ROS Review of Systems  Constitutional:  Negative for activity change and appetite change.  HENT:  Negative for sinus pressure and sore throat.  Respiratory:  Positive for shortness of breath. Negative for chest tightness and wheezing.   Cardiovascular:  Negative for chest pain and palpitations.  Gastrointestinal:  Negative for abdominal distention, abdominal pain and constipation.  Genitourinary: Negative.   Musculoskeletal: Negative.   Neurological:  Negative for syncope.  Psychiatric/Behavioral:  Negative for behavioral problems and dysphoric mood.     Objective:  BP 134/83   Pulse 82   Temp 98.4 F (36.9 C) (Oral)   Ht 5\' 3"  (1.6 m)   Wt 93 lb 9.6 oz (42.5 kg)   SpO2 98%   BMI 16.58 kg/m      11/28/2021    3:22 PM 10/06/2021    3:01 PM 09/06/2021    3:09 PM  BP/Weight  Systolic BP [id number]  Diastolic BP 83 78 77  Wt. (Lbs) 93.6    BMI 16.58 kg/m2         Physical Exam Constitutional:      Appearance: She is well-developed.  Cardiovascular:     Rate and Rhythm: Normal rate.     Heart sounds: Normal heart sounds. No murmur heard. Pulmonary:     Effort: Pulmonary effort is normal.     Breath sounds: Normal breath sounds. No wheezing or rales.  Chest:     Chest wall: No tenderness.  Abdominal:     General: Bowel sounds are normal. There is no distension.     Palpations: Abdomen is soft. There is no mass.     Tenderness: There is no abdominal tenderness.  Musculoskeletal:        General: Normal range of motion.     Right lower leg: Edema present.     Left lower leg: No edema.     Comments: Negative Bevelyn Buckles' sign  Neurological:     Mental Status: She is alert. Mental status is at baseline.  Psychiatric:        Mood and Affect: Mood normal.        Latest Ref Rng & Units 09/06/2021    3:25 PM 08/17/2021    2:28 PM 05/19/2021   11:58 AM  CMP  Glucose 70 - 99 mg/dL 120  127  81   BUN 8 - 27 mg/dL 20  14  16    Creatinine 0.57 - 1.00 mg/dL 1.09  1.20  1.12   Sodium 134 - 144 mmol/L 143  148  144   Potassium 3.5 - 5.2 mmol/L 4.4  4.1  4.2   Chloride 96 - 106 mmol/L 103  108  105   CO2 20 - 29 mmol/L 21  27  20    Calcium 8.7 - 10.3 mg/dL 10.1  10.1  9.4   Total Protein 6.0 - 8.5 g/dL  8.0  8.3   Total Bilirubin 0.0 - 1.2 mg/dL  0.3  0.3   Alkaline Phos 44 - 121 IU/L  79  64   AST 0 - 40 IU/L  13  14   ALT 0 - 32 IU/L  12  11     Lipid Panel     Component Value Date/Time   CHOL 224 (H) 05/19/2021 1158   TRIG 111 05/19/2021 1158   HDL 79 05/19/2021 1158   LDLCALC 126 (H) 05/19/2021 1158    CBC    Component Value Date/Time   WBC 3.6 08/17/2021 1428   WBC 8.7 07/07/2020 2252   RBC 5.87 (H) 08/17/2021 1428   RBC 5.12 (H) 07/07/2020 2252   HGB 12.6 08/17/2021 1428  HCT 42.9 08/17/2021 1428   PLT 232 08/17/2021 1428   MCV 73 (L) 08/17/2021 1428   MCH 21.5 (L) 08/17/2021 1428   MCH 21.3 (L) 07/07/2020 2252    MCHC 29.4 (L) 08/17/2021 1428   MCHC 28.5 (L) 07/07/2020 2252   RDW 18.0 (H) 08/17/2021 1428   LYMPHSABS 1.2 08/17/2021 1428   MONOABS 0.5 07/07/2020 2252   EOSABS 0.0 08/17/2021 1428   BASOSABS 0.0 08/17/2021 1428    Lab Results  Component Value Date   HGBA1C 5.7 (H) 08/17/2021    Assessment & Plan:  1. Pedal edema Unilateral edema of dorsum of right foot ?  Amlodipine contributing Encouraged to comply with a low-sodium diet, elevate feet, use compression stockings - Brain natriuretic peptide - DG Chest 2 View; Future  2. Essential hypertension Controlled Counseled on blood pressure goal of less than 130/80, low-sodium, DASH diet, medication compliance, 150 minutes of moderate intensity exercise per week. Discussed medication compliance, adverse effects. - amLODipine (NORVASC) 10 MG tablet; Take 1 tablet (10 mg total) by mouth daily.  Dispense: 90 tablet; Refill: 1 - losartan (COZAAR) 25 MG tablet; Take 1 tablet (25 mg total) by mouth daily.  Dispense: 90 tablet; Refill: 1  3. Pure hypercholesterolemia Uncontrolled - atorvastatin (LIPITOR) 20 MG tablet; Take 1 tablet (20 mg total) by mouth daily.  Dispense: 90 tablet; Refill: 1  4. Other form of dyspnea No evidence of JVD We will order BNP and chest x-ray as initial work-up to exclude CHF I have also advised her daughter to check in on the patient while she is asleep for symptoms of sleep apnea Advised that she does not meet criteria for handicap form and if work-up reveals she does have congestive heart failure then we we will have justification to complete this.    Meds ordered this encounter  Medications   amLODipine (NORVASC) 10 MG tablet    Sig: Take 1 tablet (10 mg total) by mouth daily.    Dispense:  90 tablet    Refill:  1   atorvastatin (LIPITOR) 20 MG tablet    Sig: Take 1 tablet (20 mg total) by mouth daily.    Dispense:  90 tablet    Refill:  1   losartan (COZAAR) 25 MG tablet    Sig: Take 1 tablet (25  mg total) by mouth daily.    Dispense:  90 tablet    Refill:  1    Follow-up: No follow-ups on file.       Hoy Register, MD, FAAFP. [facility] [place], [place] [phone]   11/28/2021, 4:18 PM

## 2021-11-29 DIAGNOSIS — F0150 Vascular dementia without behavioral disturbance: Secondary | ICD-10-CM | POA: Diagnosis not present

## 2021-11-29 LAB — BRAIN NATRIURETIC PEPTIDE: BNP: 15.1 pg/mL (ref 0.0–100.0)

## 2021-11-30 DIAGNOSIS — F0150 Vascular dementia without behavioral disturbance: Secondary | ICD-10-CM | POA: Diagnosis not present

## 2021-12-01 DIAGNOSIS — F0150 Vascular dementia without behavioral disturbance: Secondary | ICD-10-CM | POA: Diagnosis not present

## 2021-12-02 DIAGNOSIS — F0150 Vascular dementia without behavioral disturbance: Secondary | ICD-10-CM | POA: Diagnosis not present

## 2021-12-05 DIAGNOSIS — F0150 Vascular dementia without behavioral disturbance: Secondary | ICD-10-CM | POA: Diagnosis not present

## 2021-12-06 DIAGNOSIS — F0150 Vascular dementia without behavioral disturbance: Secondary | ICD-10-CM | POA: Diagnosis not present

## 2021-12-07 DIAGNOSIS — F0150 Vascular dementia without behavioral disturbance: Secondary | ICD-10-CM | POA: Diagnosis not present

## 2021-12-08 DIAGNOSIS — F0150 Vascular dementia without behavioral disturbance: Secondary | ICD-10-CM | POA: Diagnosis not present

## 2021-12-09 DIAGNOSIS — F0150 Vascular dementia without behavioral disturbance: Secondary | ICD-10-CM | POA: Diagnosis not present

## 2021-12-12 ENCOUNTER — Ambulatory Visit: Payer: Self-pay

## 2021-12-12 DIAGNOSIS — F0150 Vascular dementia without behavioral disturbance: Secondary | ICD-10-CM | POA: Diagnosis not present

## 2021-12-12 NOTE — Telephone Encounter (Signed)
Summary: back pain   Patient experiencing back pain for a few days, no available appointments that fits daughter schedule, caller states patient unable to come in today or Thursday in the morning due to caller work schedule.      Called pt - unable to  [facility] mailbox is full.  Forwarded to clinic.

## 2021-12-12 NOTE — Telephone Encounter (Signed)
Summary: back pain   Patient experiencing back pain for a few days, no available appointments that fits daughter schedule, caller states patient unable to come in today or Thursday in the morning due to caller work schedule.     Called pt  - Unable to [facility]  - mailbox is full.

## 2021-12-12 NOTE — Telephone Encounter (Signed)
Summary: back pain   Patient experiencing back pain for a few days, no available appointments that fits daughter schedule, caller states patient unable to come in today or Thursday in the morning due to caller work schedule.     Attempted to call pt but unable to LM- "mailbox full."

## 2021-12-13 DIAGNOSIS — F0150 Vascular dementia without behavioral disturbance: Secondary | ICD-10-CM | POA: Diagnosis not present

## 2021-12-14 DIAGNOSIS — F0150 Vascular dementia without behavioral disturbance: Secondary | ICD-10-CM | POA: Diagnosis not present

## 2021-12-15 DIAGNOSIS — F0150 Vascular dementia without behavioral disturbance: Secondary | ICD-10-CM | POA: Diagnosis not present

## 2021-12-15 NOTE — Telephone Encounter (Signed)
Left message on voicemail to return call.

## 2021-12-16 DIAGNOSIS — F0150 Vascular dementia without behavioral disturbance: Secondary | ICD-10-CM | POA: Diagnosis not present

## 2021-12-19 DIAGNOSIS — F0150 Vascular dementia without behavioral disturbance: Secondary | ICD-10-CM | POA: Diagnosis not present

## 2021-12-20 DIAGNOSIS — F0390 Unspecified dementia without behavioral disturbance: Secondary | ICD-10-CM | POA: Diagnosis not present

## 2021-12-20 DIAGNOSIS — R32 Unspecified urinary incontinence: Secondary | ICD-10-CM | POA: Diagnosis not present

## 2021-12-20 DIAGNOSIS — F0150 Vascular dementia without behavioral disturbance: Secondary | ICD-10-CM | POA: Diagnosis not present

## 2021-12-21 DIAGNOSIS — F0150 Vascular dementia without behavioral disturbance: Secondary | ICD-10-CM | POA: Diagnosis not present

## 2021-12-22 DIAGNOSIS — F0150 Vascular dementia without behavioral disturbance: Secondary | ICD-10-CM | POA: Diagnosis not present

## 2021-12-23 DIAGNOSIS — F0150 Vascular dementia without behavioral disturbance: Secondary | ICD-10-CM | POA: Diagnosis not present

## 2021-12-26 DIAGNOSIS — F0150 Vascular dementia without behavioral disturbance: Secondary | ICD-10-CM | POA: Diagnosis not present

## 2022-01-02 DIAGNOSIS — F0150 Vascular dementia without behavioral disturbance: Secondary | ICD-10-CM | POA: Diagnosis not present

## 2022-01-03 ENCOUNTER — Ambulatory Visit: Payer: Medicaid Other | Admitting: Pharmacist

## 2022-01-03 DIAGNOSIS — F0150 Vascular dementia without behavioral disturbance: Secondary | ICD-10-CM | POA: Diagnosis not present

## 2022-01-04 DIAGNOSIS — F0150 Vascular dementia without behavioral disturbance: Secondary | ICD-10-CM | POA: Diagnosis not present

## 2022-01-05 DIAGNOSIS — F0150 Vascular dementia without behavioral disturbance: Secondary | ICD-10-CM | POA: Diagnosis not present

## 2022-01-06 ENCOUNTER — Ambulatory Visit: Payer: Medicaid Other | Attending: Family Medicine | Admitting: Pharmacist

## 2022-01-06 VITALS — BP 164/93

## 2022-01-06 DIAGNOSIS — F0150 Vascular dementia without behavioral disturbance: Secondary | ICD-10-CM | POA: Diagnosis not present

## 2022-01-06 DIAGNOSIS — I10 Essential (primary) hypertension: Secondary | ICD-10-CM | POA: Diagnosis not present

## 2022-01-06 NOTE — Progress Notes (Signed)
   S:    PCP: Dr. Alvis Lemmings   No chief complaint on file.  Erica Lopez is a 61 y.o. female who presents for hypertension evaluation, education, and management. PMH is significant for HTN, vascular dementia, history of CVA, malnutrition. Patient was referred and last seen by Primary Care Provider, Dr. Alvis Lemmings, on 11/28/2021.   Today, patient arrives in good spirits and presents with assistance. Her daughter is with her. She denies dizziness, headache, blurred vision, swelling. Denies any side effects to the losartan.   Patient reports hypertension is longstanding.   Family/Social history:  -Fhx: HTN, stroke, heart disease, DM -Tobacco: never smoker  -Alcohol: denies use   Medication adherence reported. Patient has taken BP medications today.   Current antihypertensives include: amlodipine 10 mg daily, carvedilol 3.125 mg BID, losartan 25 mg daily  Reported home BP readings: none  Patient reported dietary habits:  -Compliant with sodium restriction, denies excessive intake of caffeine  -Denies eating take out or fast food   Patient-reported exercise habits:  -None outside of house work   O:  Vitals:   01/06/22 1611  BP: (!) 164/93    Last 3 Office BP readings: BP Readings from Last 3 Encounters:  01/06/22 (!) 164/93  11/28/21 134/83  10/06/21 118/78    BMET    Component Value Date/Time   NA 143 09/06/2021 1525   K 4.4 09/06/2021 1525   CL 103 09/06/2021 1525   CO2 21 09/06/2021 1525   GLUCOSE 120 (H) 09/06/2021 1525   GLUCOSE 212 (H) 07/07/2020 2252   BUN 20 09/06/2021 1525   CREATININE 1.09 (H) 09/06/2021 1525   CREATININE 0.98 08/10/2016 0944   CALCIUM 10.1 09/06/2021 1525   GFRNONAA 44 (L) 07/07/2020 2252   GFRAA 54 (L) 06/17/2020 1616    Renal function: CrCl cannot be calculated (Patient's most recent lab result is older than the maximum 21 days allowed.).  Clinical ASCVD: Yes   A/P: Hypertension longstanding currently above goal on current medications.  BP goal < 130/80 mmHg. Medication adherence appears appropriate. She had good BP readings with me in April and May of this year. BP was good 11/28/2021 with PCP. Will hold off on any changes at this time.  -Continued current changes. -Patient educated on purpose, proper use, and potential adverse effects of losartan.  -F/u labs ordered - none. -Counseled on lifestyle modifications for blood pressure control including reduced dietary sodium, increased exercise, adequate sleep. -Encouraged patient to check BP at home and bring log of readings to next visit. Counseled on proper use of home BP cuff.   Results reviewed and written information provided. Patient verbalized understanding of treatment plan. Total time in face-to-face counseling 20 minutes.   F/u clinic visit with me in 6 weeks.   Butch Penny, PharmD, [place], CPP Clinical Pharmacist [facility] [phone]

## 2022-01-09 DIAGNOSIS — F0150 Vascular dementia without behavioral disturbance: Secondary | ICD-10-CM | POA: Diagnosis not present

## 2022-01-10 DIAGNOSIS — F0150 Vascular dementia without behavioral disturbance: Secondary | ICD-10-CM | POA: Diagnosis not present

## 2022-01-11 DIAGNOSIS — F0150 Vascular dementia without behavioral disturbance: Secondary | ICD-10-CM | POA: Diagnosis not present

## 2022-01-12 DIAGNOSIS — F0150 Vascular dementia without behavioral disturbance: Secondary | ICD-10-CM | POA: Diagnosis not present

## 2022-01-13 DIAGNOSIS — F0150 Vascular dementia without behavioral disturbance: Secondary | ICD-10-CM | POA: Diagnosis not present

## 2022-01-16 DIAGNOSIS — F0150 Vascular dementia without behavioral disturbance: Secondary | ICD-10-CM | POA: Diagnosis not present

## 2022-01-17 DIAGNOSIS — F0150 Vascular dementia without behavioral disturbance: Secondary | ICD-10-CM | POA: Diagnosis not present

## 2022-01-18 DIAGNOSIS — F0150 Vascular dementia without behavioral disturbance: Secondary | ICD-10-CM | POA: Diagnosis not present

## 2022-01-19 DIAGNOSIS — F0150 Vascular dementia without behavioral disturbance: Secondary | ICD-10-CM | POA: Diagnosis not present

## 2022-01-20 DIAGNOSIS — F0150 Vascular dementia without behavioral disturbance: Secondary | ICD-10-CM | POA: Diagnosis not present

## 2022-01-23 DIAGNOSIS — F0150 Vascular dementia without behavioral disturbance: Secondary | ICD-10-CM | POA: Diagnosis not present

## 2022-01-24 DIAGNOSIS — F0150 Vascular dementia without behavioral disturbance: Secondary | ICD-10-CM | POA: Diagnosis not present

## 2022-01-25 DIAGNOSIS — F0150 Vascular dementia without behavioral disturbance: Secondary | ICD-10-CM | POA: Diagnosis not present

## 2022-01-26 DIAGNOSIS — F0150 Vascular dementia without behavioral disturbance: Secondary | ICD-10-CM | POA: Diagnosis not present

## 2022-01-27 DIAGNOSIS — F0150 Vascular dementia without behavioral disturbance: Secondary | ICD-10-CM | POA: Diagnosis not present

## 2022-01-30 DIAGNOSIS — F0150 Vascular dementia without behavioral disturbance: Secondary | ICD-10-CM | POA: Diagnosis not present

## 2022-01-31 DIAGNOSIS — F0150 Vascular dementia without behavioral disturbance: Secondary | ICD-10-CM | POA: Diagnosis not present

## 2022-02-01 DIAGNOSIS — F0150 Vascular dementia without behavioral disturbance: Secondary | ICD-10-CM | POA: Diagnosis not present

## 2022-02-02 DIAGNOSIS — F0150 Vascular dementia without behavioral disturbance: Secondary | ICD-10-CM | POA: Diagnosis not present

## 2022-02-03 DIAGNOSIS — F0150 Vascular dementia without behavioral disturbance: Secondary | ICD-10-CM | POA: Diagnosis not present

## 2022-02-06 DIAGNOSIS — F0150 Vascular dementia without behavioral disturbance: Secondary | ICD-10-CM | POA: Diagnosis not present

## 2022-02-07 DIAGNOSIS — F0150 Vascular dementia without behavioral disturbance: Secondary | ICD-10-CM | POA: Diagnosis not present

## 2022-02-08 DIAGNOSIS — F0150 Vascular dementia without behavioral disturbance: Secondary | ICD-10-CM | POA: Diagnosis not present

## 2022-02-09 DIAGNOSIS — F0150 Vascular dementia without behavioral disturbance: Secondary | ICD-10-CM | POA: Diagnosis not present

## 2022-02-10 DIAGNOSIS — F0150 Vascular dementia without behavioral disturbance: Secondary | ICD-10-CM | POA: Diagnosis not present

## 2022-02-13 ENCOUNTER — Ambulatory Visit: Payer: Medicaid Other | Admitting: Pharmacist

## 2022-02-13 DIAGNOSIS — F0150 Vascular dementia without behavioral disturbance: Secondary | ICD-10-CM | POA: Diagnosis not present

## 2022-02-14 DIAGNOSIS — F0150 Vascular dementia without behavioral disturbance: Secondary | ICD-10-CM | POA: Diagnosis not present

## 2022-02-15 DIAGNOSIS — F0150 Vascular dementia without behavioral disturbance: Secondary | ICD-10-CM | POA: Diagnosis not present

## 2022-02-16 DIAGNOSIS — F0150 Vascular dementia without behavioral disturbance: Secondary | ICD-10-CM | POA: Diagnosis not present

## 2022-02-17 DIAGNOSIS — F0150 Vascular dementia without behavioral disturbance: Secondary | ICD-10-CM | POA: Diagnosis not present

## 2022-02-20 DIAGNOSIS — F0150 Vascular dementia without behavioral disturbance: Secondary | ICD-10-CM | POA: Diagnosis not present

## 2022-02-21 DIAGNOSIS — F0150 Vascular dementia without behavioral disturbance: Secondary | ICD-10-CM | POA: Diagnosis not present

## 2022-02-23 DIAGNOSIS — F0150 Vascular dementia without behavioral disturbance: Secondary | ICD-10-CM | POA: Diagnosis not present

## 2022-02-27 DIAGNOSIS — F0150 Vascular dementia without behavioral disturbance: Secondary | ICD-10-CM | POA: Diagnosis not present

## 2022-02-28 ENCOUNTER — Ambulatory Visit: Payer: Medicaid Other | Attending: Family Medicine | Admitting: Family Medicine

## 2022-02-28 ENCOUNTER — Encounter: Payer: Self-pay | Admitting: Family Medicine

## 2022-02-28 VITALS — BP 119/81 | HR 86 | Temp 98.7°F | Ht 63.0 in | Wt 90.0 lb

## 2022-02-28 DIAGNOSIS — E7800 Pure hypercholesterolemia, unspecified: Secondary | ICD-10-CM

## 2022-02-28 DIAGNOSIS — Z8673 Personal history of transient ischemic attack (TIA), and cerebral infarction without residual deficits: Secondary | ICD-10-CM | POA: Diagnosis not present

## 2022-02-28 DIAGNOSIS — I10 Essential (primary) hypertension: Secondary | ICD-10-CM | POA: Diagnosis not present

## 2022-02-28 DIAGNOSIS — F0150 Vascular dementia without behavioral disturbance: Secondary | ICD-10-CM

## 2022-02-28 MED ORDER — CARVEDILOL 3.125 MG PO TABS: 3.1250 mg | ORAL_TABLET | Freq: Two times a day (BID) | ORAL | 1 refills | Status: DC

## 2022-02-28 MED ORDER — ATORVASTATIN CALCIUM 20 MG PO TABS: 20.0000 mg | ORAL_TABLET | Freq: Every day | ORAL | 1 refills | Status: DC

## 2022-02-28 MED ORDER — LOSARTAN POTASSIUM 25 MG PO TABS: 25.0000 mg | ORAL_TABLET | Freq: Every day | ORAL | 1 refills | Status: DC

## 2022-02-28 MED ORDER — AMLODIPINE BESYLATE 10 MG PO TABS: 10.0000 mg | ORAL_TABLET | Freq: Every day | ORAL | 1 refills | Status: DC

## 2022-02-28 NOTE — Progress Notes (Signed)
Subjective:  Patient ID: Erica Lopez, female    DOB: 05-22-1961  Age: 61 y.o. MRN: [id number]  CC: Hypertension  HPI Erica Lopez is a 61 y.o. 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  Interval History:  She goes for walks with her [employer] worker and recently got a puppy that stimulates her and get her to go walking.  Her puppy is very active and keeps her engaged.  She calls the puppy her 'grandbaby'. They will be moving houses and the Landlord does not like animals. Daughter is wondering if she can obtain a letter to state the dog is a service dog as her mother will be allowed to keep the dog if this is stated. They are willing to work on training the puppy is a service dog but will need to wait 6 months.  She endorses adherence with her antihypertensive and statin blood pressure is controlled. Denies additional concerns today. Past Medical History:  Diagnosis Date   Hypertension    Stroke (HCC) 2014    No past surgical history on file.  Family History  Problem Relation Age of Onset   Cancer Other    Hypertension Other    Stroke Other    Heart disease Other    Diabetes Other     Social History   Socioeconomic History   Marital status: Single    Spouse name: Not on file   Number of children: Not on file   Years of education: Not on file   Highest education level: Not on file  Occupational History   Not on file  Tobacco Use   Smoking status: Never   Smokeless tobacco: Never  Substance and Sexual Activity   Alcohol use: No   Drug use: No   Sexual activity: Not on file  Other Topics Concern   Not on file  Social History Narrative   Not on file   Social Determinants of Health   Financial Resource Strain: Not on file  Food Insecurity: Not on file  Transportation Needs: Not on file  Physical  Activity: Not on file  Stress: Not on file  Social Connections: Not on file    Allergies  Allergen Reactions   Aspirin Anaphylaxis and Other (See Comments)    Bleeding event   Lisinopril Swelling   Other Hives, Swelling and Other (See Comments)    Blueberries, causing hives and swelling    Outpatient Medications Prior to Visit  Medication Sig Dispense Refill   triamcinolone (KENALOG) 0.025 % ointment Apply 1 application topically 2 (two) times daily. 30 g 0   amLODipine (NORVASC) 10 MG tablet Take 1 tablet (10 mg total) by mouth daily. 90 tablet 1   atorvastatin (LIPITOR) 20 MG tablet Take 1 tablet (20 mg total) by mouth daily. 90 tablet 1   carvedilol (COREG) 3.125 MG tablet Take 1 tablet (3.125 mg total) by mouth 2 (two) times daily with a meal. 180 tablet 1   losartan (COZAAR) 25 MG tablet Take 1 tablet (25 mg total) by mouth daily. 90 tablet 1   No facility-administered medications prior to visit.     ROS Review of Systems  Constitutional:  Negative for activity change and appetite change.  HENT:  Negative for sinus pressure and sore throat.   Respiratory:  Negative for chest tightness, shortness of breath and wheezing.   Cardiovascular:  Negative for chest pain and palpitations.  Gastrointestinal:  Negative for abdominal distention, abdominal pain and constipation.  Genitourinary: Negative.   Musculoskeletal: Negative.   Psychiatric/Behavioral:  Negative for behavioral problems and dysphoric mood.     Objective:  BP 119/81   Pulse 86   Temp 98.7 F (37.1 C) (Oral)   Ht 5\' 3"  (1.6 m)   Wt 90 lb (40.8 kg)   SpO2 99%   BMI 15.94 kg/m      02/28/2022    2:55 PM 01/06/2022    4:11 PM 11/28/2021    3:22 PM  BP/Weight  Systolic BP 119 164 134  Diastolic BP 81 93 83  Wt. (Lbs) 90  93.6  BMI 15.94 kg/m2  16.58 kg/m2      Physical Exam Constitutional:      Appearance: She is well-developed.     Comments: Underweight  Cardiovascular:     Rate and Rhythm:  Normal rate.     Heart sounds: Normal heart sounds. No murmur heard. Pulmonary:     Effort: Pulmonary effort is normal.     Breath sounds: Normal breath sounds. No wheezing or rales.  Chest:     Chest wall: No tenderness.  Abdominal:     General: Bowel sounds are normal. There is no distension.     Palpations: Abdomen is soft. There is no mass.     Tenderness: There is no abdominal tenderness.  Musculoskeletal:        General: Normal range of motion.     Right lower leg: No edema.     Left lower leg: No edema.  Neurological:     Mental Status: She is alert. Mental status is at baseline.  Psychiatric:        Mood and Affect: Mood normal.        Latest Ref Rng & Units 09/06/2021    3:25 PM 08/17/2021    2:28 PM 05/19/2021   11:58 AM  CMP  Glucose 70 - 99 mg/dL 05/21/2021  888  81   BUN 8 - 27 mg/dL 20  14  16    Creatinine 0.57 - 1.00 mg/dL 280   0.34   Sodium 134 - 144 mmol/L 143  148  144   Potassium 3.5 - 5.2 mmol/L 4.4  4.1  4.2   Chloride 96 - 106 mmol/L 103  108  105   CO2 20 - 29 mmol/L 21  27  20    Calcium 8.7 - 10.3 mg/dL 9.17  9.15  9.4   Total Protein 6.0 - 8.5 g/dL  8.0  8.3   Total Bilirubin 0.0 - 1.2 mg/dL  0.3  0.3   Alkaline Phos 44 - 121 IU/L  79  64   AST 0 - 40 IU/L  13  14   ALT 0 - 32 IU/L  12  11     Lipid Panel     Component Value Date/Time   CHOL 224 (H) 05/19/2021 1158   TRIG 111 05/19/2021 1158   HDL 79 05/19/2021 1158   LDLCALC 126 (H) 05/19/2021 1158    CBC    Component Value Date/Time   WBC 3.6 08/17/2021 1428   WBC 8.7 07/07/2020 2252   RBC 5.87 (H) 08/17/2021 1428   RBC 5.12 (H) 07/07/2020 2252   HGB 12.6 08/17/2021 1428   HCT 42.9 08/17/2021 1428   PLT 232 08/17/2021 1428   MCV 73 (L) 08/17/2021 1428   MCH 21.5 (L) 08/17/2021 1428  MCH 21.3 (L) 07/07/2020 2252   MCHC 29.4 (L) 08/17/2021 1428   MCHC 28.5 (L) 07/07/2020 2252   RDW 18.0 (H) 08/17/2021 1428   LYMPHSABS 1.2 08/17/2021 1428   MONOABS 0.5 07/07/2020 2252    EOSABS 0.0 08/17/2021 1428   BASOSABS 0.0 08/17/2021 1428    Lab Results  Component Value Date   HGBA1C 5.7 (H) 08/17/2021    Assessment & Plan:  1. Essential hypertension Controlled Continue current regimen Counseled on blood pressure goal of less than 130/80, low-sodium, DASH diet, medication compliance, 150 minutes of moderate intensity exercise per week. Discussed medication compliance, adverse effects. - amLODipine (NORVASC) 10 MG tablet; Take 1 tablet (10 mg total) by mouth daily.  Dispense: 90 tablet; Refill: 1 - carvedilol (COREG) 3.125 MG tablet; Take 1 tablet (3.125 mg total) by mouth 2 (two) times daily with a meal.  Dispense: 180 tablet; Refill: 1 - losartan (COZAAR) 25 MG tablet; Take 1 tablet (25 mg total) by mouth daily.  Dispense: 90 tablet; Refill: 1  2. Pure hypercholesterolemia Uncontrolled from last set of labs Low-cholesterol diet We will plan on a lipid panel at next visit. - Basic Metabolic Panel - atorvastatin (LIPITOR) 20 MG tablet; Take 1 tablet (20 mg total) by mouth daily.  Dispense: 90 tablet; Refill: 1  3. Vascular dementia without behavioral disturbance ([place]) Acquisition of a puppy does provide health benefits with regards to her disability I have provided the letter for her so she can keep her puppy Daughter will be working on getting the puppy trained as a Engineer, water ordered this encounter  Medications   amLODipine (NORVASC) 10 MG tablet    Sig: Take 1 tablet (10 mg total) by mouth daily.    Dispense:  90 tablet    Refill:  1   atorvastatin (LIPITOR) 20 MG tablet    Sig: Take 1 tablet (20 mg total) by mouth daily.    Dispense:  90 tablet    Refill:  1   carvedilol (COREG) 3.125 MG tablet    Sig: Take 1 tablet (3.125 mg total) by mouth 2 (two) times daily with a meal.    Dispense:  180 tablet    Refill:  1   losartan (COZAAR) 25 MG tablet    Sig: Take 1 tablet (25 mg total) by mouth daily.    Dispense:  90 tablet    Refill:  1     Follow-up: Return in about 6 months (around 08/30/2022) for Chronic medical conditions.       Charlott Rakes, MD, FAAFP. [facility], [place]   02/28/2022, 3:35 PM

## 2022-02-28 NOTE — Patient Instructions (Signed)
Exercising to Stay Healthy To become healthy and stay healthy, it is recommended that you do moderate-intensity and vigorous-intensity exercise. You can tell that you are exercising at a moderate intensity if your heart starts beating faster and you start breathing faster but can still hold a conversation. You can tell that you are exercising at a vigorous intensity if you are breathing much harder and faster and cannot hold a conversation while exercising. How can exercise benefit me? Exercising regularly is important. It has many health benefits, such as: Improving overall fitness, flexibility, and endurance. Increasing bone density. Helping with weight control. Decreasing body fat. Increasing muscle strength and endurance. Reducing stress and tension, anxiety, depression, or anger. Improving overall health. What guidelines should I follow while exercising? Before you start a new exercise program, talk with your health care provider. Do not exercise so much that you hurt yourself, feel dizzy, or get very short of breath. Wear comfortable clothes and wear shoes with good support. Drink plenty of water while you exercise to prevent dehydration or heat stroke. Work out until your breathing and your heartbeat get faster (moderate intensity). How often should I exercise? Choose an activity that you enjoy, and set realistic goals. Your health care provider can help you make an activity plan that is individually designed and works best for you. Exercise regularly as told by your health care provider. This may include: Doing strength training two times a week, such as: Lifting weights. Using resistance bands. Push-ups. Sit-ups. Yoga. Doing a certain intensity of exercise for a given amount of time. Choose from these options: A total of 150 minutes of moderate-intensity exercise every week. A total of 75 minutes of vigorous-intensity exercise every week. A mix of moderate-intensity and  vigorous-intensity exercise every week. Children, pregnant women, people who have not exercised regularly, people who are overweight, and older adults may need to talk with a health care provider about what activities are safe to perform. If you have a medical condition, be sure to talk with your health care provider before you start a new exercise program. What are some exercise ideas? Moderate-intensity exercise ideas include: Walking 1 mile (1.6 km) in about 15 minutes. Biking. Hiking. Golfing. Dancing. Water aerobics. Vigorous-intensity exercise ideas include: Walking 4.5 miles (7.2 km) or more in about 1 hour. Jogging or running 5 miles (8 km) in about 1 hour. Biking 10 miles (16.1 km) or more in about 1 hour. Lap swimming. Roller-skating or in-line skating. Cross-country skiing. Vigorous competitive sports, such as football, basketball, and soccer. Jumping rope. Aerobic dancing. What are some everyday activities that can help me get exercise? Yard work, such as: Pushing a lawn mower. Raking and bagging leaves. Washing your car. Pushing a stroller. Shoveling snow. Gardening. Washing windows or floors. How can I be more active in my day-to-day activities? Use stairs instead of an elevator. Take a walk during your lunch break. If you drive, park your car farther away from your work or school. If you take public transportation, get off one stop early and walk the rest of the way. Stand up or walk around during all of your indoor phone calls. Get up, stretch, and walk around every 30 minutes throughout the day. Enjoy exercise with a friend. Support to continue exercising will help you keep a regular routine of activity. Where to find more information You can find more information about exercising to stay healthy from: U.S. Department of Health and Human Services: www.hhs.gov Centers for Disease Control and Prevention (  CDC): www.cdc.gov Summary Exercising regularly is  important. It will improve your overall fitness, flexibility, and endurance. Regular exercise will also improve your overall health. It can help you control your weight, reduce stress, and improve your bone density. Do not exercise so much that you hurt yourself, feel dizzy, or get very short of breath. Before you start a new exercise program, talk with your health care provider. This information is not intended to replace advice given to you by your health care provider. Make sure you discuss any questions you have with your health care provider. Document Revised: 09/03/2020 Document Reviewed: 09/03/2020 Elsevier Patient Education  2023 Elsevier Inc.  

## 2022-03-01 DIAGNOSIS — F0150 Vascular dementia without behavioral disturbance: Secondary | ICD-10-CM | POA: Diagnosis not present

## 2022-03-01 LAB — BASIC METABOLIC PANEL
BUN/Creatinine Ratio: 17 (ref 12–28)
BUN: 22 mg/dL (ref 8–27)
CO2: 19 mmol/L — ABNORMAL LOW (ref 20–29)
Calcium: 9.8 mg/dL (ref 8.7–10.3)
Chloride: 105 mmol/L (ref 96–106)
Creatinine, Ser: 1.33 mg/dL — ABNORMAL HIGH (ref 0.57–1.00)
Glucose: 101 mg/dL — ABNORMAL HIGH (ref 70–99)
Potassium: 4.3 mmol/L (ref 3.5–5.2)
Sodium: 141 mmol/L (ref 134–144)
eGFR: 45 mL/min/{1.73_m2} — ABNORMAL LOW (ref >59)

## 2022-03-02 DIAGNOSIS — F0150 Vascular dementia without behavioral disturbance: Secondary | ICD-10-CM | POA: Diagnosis not present

## 2022-03-03 DIAGNOSIS — F0150 Vascular dementia without behavioral disturbance: Secondary | ICD-10-CM | POA: Diagnosis not present

## 2022-03-06 DIAGNOSIS — F0150 Vascular dementia without behavioral disturbance: Secondary | ICD-10-CM | POA: Diagnosis not present

## 2022-03-07 DIAGNOSIS — F0150 Vascular dementia without behavioral disturbance: Secondary | ICD-10-CM | POA: Diagnosis not present

## 2022-03-08 DIAGNOSIS — F0150 Vascular dementia without behavioral disturbance: Secondary | ICD-10-CM | POA: Diagnosis not present

## 2022-03-09 DIAGNOSIS — F0150 Vascular dementia without behavioral disturbance: Secondary | ICD-10-CM | POA: Diagnosis not present

## 2022-03-10 DIAGNOSIS — F0150 Vascular dementia without behavioral disturbance: Secondary | ICD-10-CM | POA: Diagnosis not present

## 2022-03-13 DIAGNOSIS — F0150 Vascular dementia without behavioral disturbance: Secondary | ICD-10-CM | POA: Diagnosis not present

## 2022-03-14 DIAGNOSIS — R32 Unspecified urinary incontinence: Secondary | ICD-10-CM | POA: Diagnosis not present

## 2022-03-14 DIAGNOSIS — F0390 Unspecified dementia without behavioral disturbance: Secondary | ICD-10-CM | POA: Diagnosis not present

## 2022-03-14 DIAGNOSIS — F0150 Vascular dementia without behavioral disturbance: Secondary | ICD-10-CM | POA: Diagnosis not present

## 2022-03-15 DIAGNOSIS — F0150 Vascular dementia without behavioral disturbance: Secondary | ICD-10-CM | POA: Diagnosis not present

## 2022-03-16 DIAGNOSIS — F0150 Vascular dementia without behavioral disturbance: Secondary | ICD-10-CM | POA: Diagnosis not present

## 2022-03-17 DIAGNOSIS — F0150 Vascular dementia without behavioral disturbance: Secondary | ICD-10-CM | POA: Diagnosis not present

## 2022-03-20 DIAGNOSIS — F0150 Vascular dementia without behavioral disturbance: Secondary | ICD-10-CM | POA: Diagnosis not present

## 2022-03-21 DIAGNOSIS — F0150 Vascular dementia without behavioral disturbance: Secondary | ICD-10-CM | POA: Diagnosis not present

## 2022-03-22 DIAGNOSIS — F0150 Vascular dementia without behavioral disturbance: Secondary | ICD-10-CM | POA: Diagnosis not present

## 2022-03-23 DIAGNOSIS — F0150 Vascular dementia without behavioral disturbance: Secondary | ICD-10-CM | POA: Diagnosis not present

## 2022-03-24 DIAGNOSIS — F0150 Vascular dementia without behavioral disturbance: Secondary | ICD-10-CM | POA: Diagnosis not present

## 2022-03-27 DIAGNOSIS — F0150 Vascular dementia without behavioral disturbance: Secondary | ICD-10-CM | POA: Diagnosis not present

## 2022-03-28 DIAGNOSIS — F0150 Vascular dementia without behavioral disturbance: Secondary | ICD-10-CM | POA: Diagnosis not present

## 2022-03-29 DIAGNOSIS — F0150 Vascular dementia without behavioral disturbance: Secondary | ICD-10-CM | POA: Diagnosis not present

## 2022-03-30 DIAGNOSIS — F0150 Vascular dementia without behavioral disturbance: Secondary | ICD-10-CM | POA: Diagnosis not present

## 2022-03-31 DIAGNOSIS — F0150 Vascular dementia without behavioral disturbance: Secondary | ICD-10-CM | POA: Diagnosis not present

## 2022-04-03 DIAGNOSIS — F0150 Vascular dementia without behavioral disturbance: Secondary | ICD-10-CM | POA: Diagnosis not present

## 2022-04-04 ENCOUNTER — Telehealth: Payer: Self-pay | Admitting: Family Medicine

## 2022-04-04 DIAGNOSIS — F0150 Vascular dementia without behavioral disturbance: Secondary | ICD-10-CM | POA: Diagnosis not present

## 2022-04-04 NOTE — Telephone Encounter (Signed)
Amber from [facility], is requesting a bedside commode and shower chair for patient.

## 2022-04-04 NOTE — Telephone Encounter (Signed)
Ok to write for patient.

## 2022-04-05 ENCOUNTER — Other Ambulatory Visit: Payer: Self-pay

## 2022-04-05 DIAGNOSIS — F0150 Vascular dementia without behavioral disturbance: Secondary | ICD-10-CM | POA: Diagnosis not present

## 2022-04-05 MED ORDER — MISC. DEVICES MISC: 0 refills | Status: DC

## 2022-04-05 NOTE — Telephone Encounter (Signed)
Yes.  Thank you.

## 2022-04-05 NOTE — Telephone Encounter (Signed)
Order ha been faxed to [facility].

## 2022-04-06 DIAGNOSIS — F0150 Vascular dementia without behavioral disturbance: Secondary | ICD-10-CM | POA: Diagnosis not present

## 2022-04-07 DIAGNOSIS — F0150 Vascular dementia without behavioral disturbance: Secondary | ICD-10-CM | POA: Diagnosis not present

## 2022-04-10 DIAGNOSIS — F0150 Vascular dementia without behavioral disturbance: Secondary | ICD-10-CM | POA: Diagnosis not present

## 2022-04-11 DIAGNOSIS — F0150 Vascular dementia without behavioral disturbance: Secondary | ICD-10-CM | POA: Diagnosis not present

## 2022-04-12 DIAGNOSIS — F0150 Vascular dementia without behavioral disturbance: Secondary | ICD-10-CM | POA: Diagnosis not present

## 2022-04-14 DIAGNOSIS — F0150 Vascular dementia without behavioral disturbance: Secondary | ICD-10-CM | POA: Diagnosis not present

## 2022-04-17 DIAGNOSIS — F0150 Vascular dementia without behavioral disturbance: Secondary | ICD-10-CM | POA: Diagnosis not present

## 2022-04-18 DIAGNOSIS — F0150 Vascular dementia without behavioral disturbance: Secondary | ICD-10-CM | POA: Diagnosis not present

## 2022-04-19 DIAGNOSIS — F0150 Vascular dementia without behavioral disturbance: Secondary | ICD-10-CM | POA: Diagnosis not present

## 2022-04-20 DIAGNOSIS — F0150 Vascular dementia without behavioral disturbance: Secondary | ICD-10-CM | POA: Diagnosis not present

## 2022-04-21 DIAGNOSIS — F0150 Vascular dementia without behavioral disturbance: Secondary | ICD-10-CM | POA: Diagnosis not present

## 2022-04-24 DIAGNOSIS — F0150 Vascular dementia without behavioral disturbance: Secondary | ICD-10-CM | POA: Diagnosis not present

## 2022-04-25 DIAGNOSIS — F0150 Vascular dementia without behavioral disturbance: Secondary | ICD-10-CM | POA: Diagnosis not present

## 2022-04-25 IMAGING — DX DG CHEST 1V PORT
1 series · 1 of 1 positions shown · non-contrast
Comparison: 01/05/2017

CLINICAL DATA: COVID positive, hypertension

EXAM:
PORTABLE CHEST 1 VIEW

[chest ap]
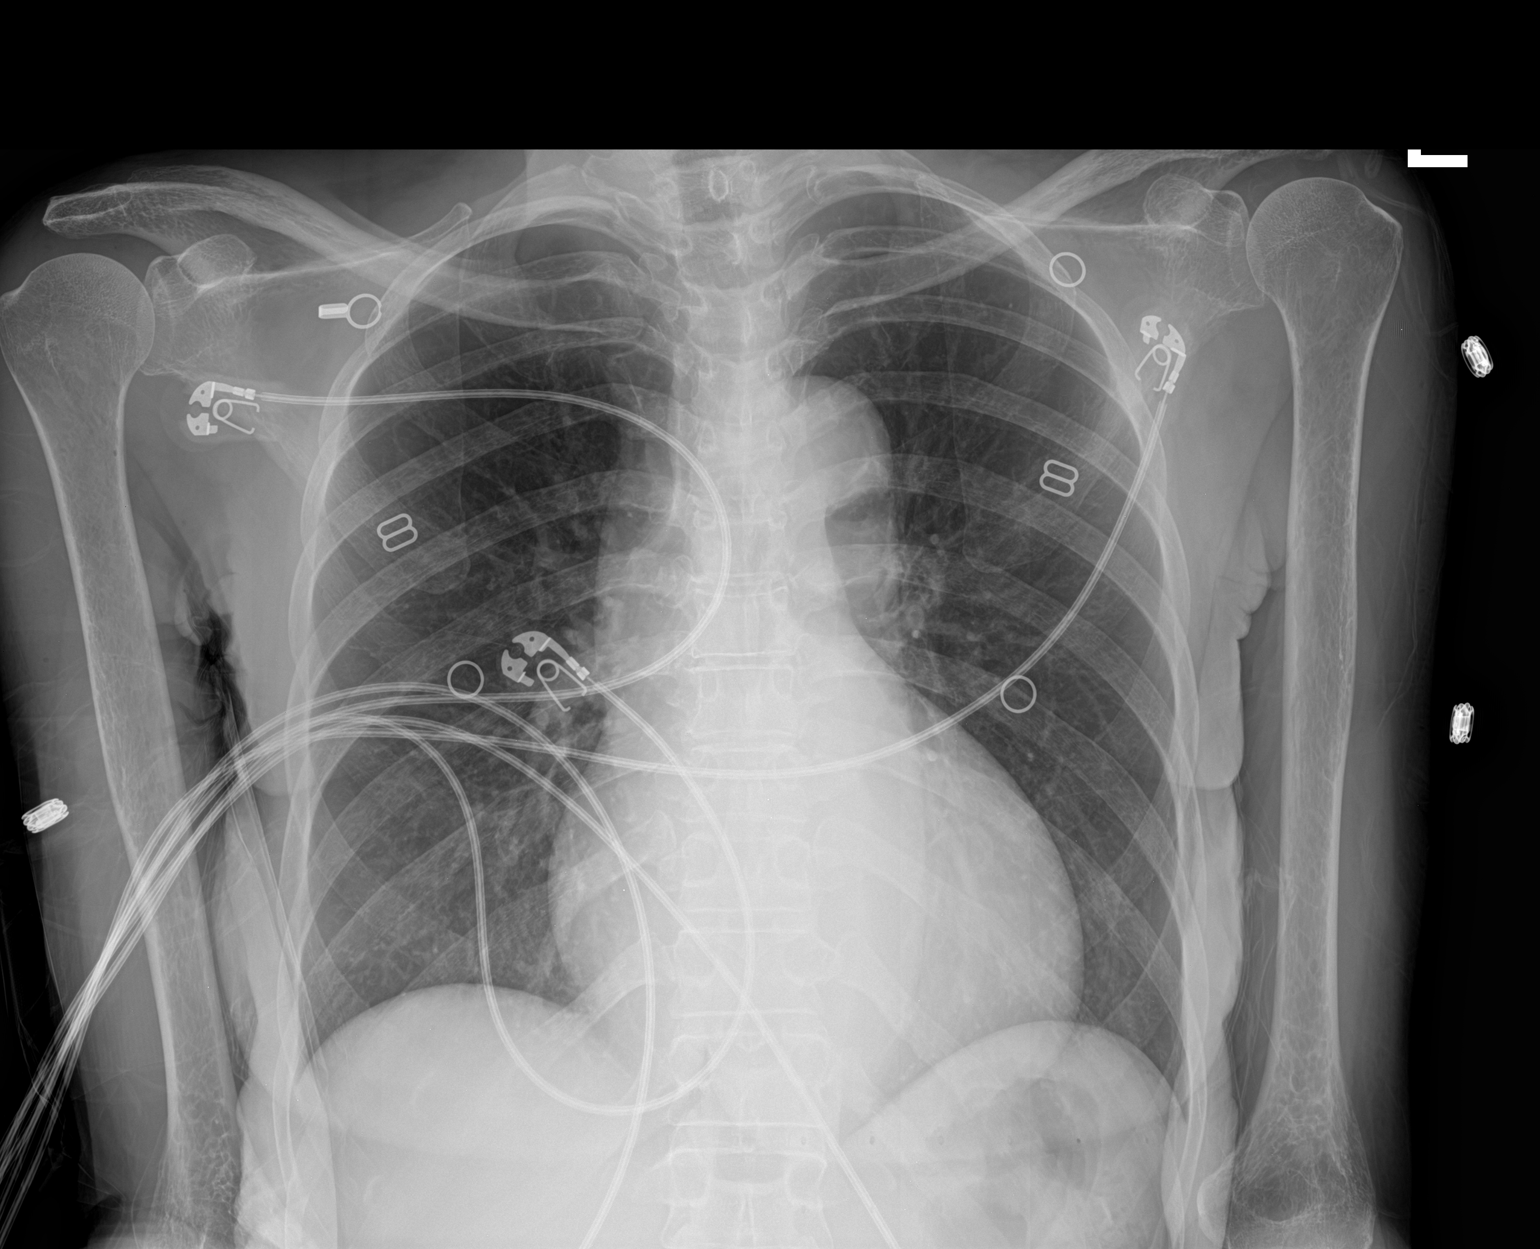

[1 of 1 positions shown; findings below may reference images not displayed]

FINDINGS: Lungs are clear. No pneumothorax or pleural effusion. Cardiac size
is at the upper limits of normal. Pulmonary vascularity is normal.
No acute bone abnormality.
IMPRESSION: No active disease.

## 2022-04-26 DIAGNOSIS — F0150 Vascular dementia without behavioral disturbance: Secondary | ICD-10-CM | POA: Diagnosis not present

## 2022-04-27 DIAGNOSIS — F0150 Vascular dementia without behavioral disturbance: Secondary | ICD-10-CM | POA: Diagnosis not present

## 2022-04-28 DIAGNOSIS — F0150 Vascular dementia without behavioral disturbance: Secondary | ICD-10-CM | POA: Diagnosis not present

## 2022-05-01 DIAGNOSIS — F0150 Vascular dementia without behavioral disturbance: Secondary | ICD-10-CM | POA: Diagnosis not present

## 2022-05-02 DIAGNOSIS — F0150 Vascular dementia without behavioral disturbance: Secondary | ICD-10-CM | POA: Diagnosis not present

## 2022-05-03 DIAGNOSIS — F0150 Vascular dementia without behavioral disturbance: Secondary | ICD-10-CM | POA: Diagnosis not present

## 2022-05-04 DIAGNOSIS — F0150 Vascular dementia without behavioral disturbance: Secondary | ICD-10-CM | POA: Diagnosis not present

## 2022-05-05 DIAGNOSIS — F0150 Vascular dementia without behavioral disturbance: Secondary | ICD-10-CM | POA: Diagnosis not present

## 2022-05-08 DIAGNOSIS — F0150 Vascular dementia without behavioral disturbance: Secondary | ICD-10-CM | POA: Diagnosis not present

## 2022-05-09 DIAGNOSIS — F0150 Vascular dementia without behavioral disturbance: Secondary | ICD-10-CM | POA: Diagnosis not present

## 2022-05-10 DIAGNOSIS — F0150 Vascular dementia without behavioral disturbance: Secondary | ICD-10-CM | POA: Diagnosis not present

## 2022-05-11 DIAGNOSIS — F0150 Vascular dementia without behavioral disturbance: Secondary | ICD-10-CM | POA: Diagnosis not present

## 2022-05-12 DIAGNOSIS — F0150 Vascular dementia without behavioral disturbance: Secondary | ICD-10-CM | POA: Diagnosis not present

## 2022-05-17 DIAGNOSIS — F0150 Vascular dementia without behavioral disturbance: Secondary | ICD-10-CM | POA: Diagnosis not present

## 2022-05-18 DIAGNOSIS — F0150 Vascular dementia without behavioral disturbance: Secondary | ICD-10-CM | POA: Diagnosis not present

## 2022-05-19 DIAGNOSIS — F0150 Vascular dementia without behavioral disturbance: Secondary | ICD-10-CM | POA: Diagnosis not present

## 2022-05-22 DIAGNOSIS — F0150 Vascular dementia without behavioral disturbance: Secondary | ICD-10-CM | POA: Diagnosis not present

## 2022-05-23 DIAGNOSIS — F0150 Vascular dementia without behavioral disturbance: Secondary | ICD-10-CM | POA: Diagnosis not present

## 2022-05-24 DIAGNOSIS — F0150 Vascular dementia without behavioral disturbance: Secondary | ICD-10-CM | POA: Diagnosis not present

## 2022-05-25 DIAGNOSIS — F0150 Vascular dementia without behavioral disturbance: Secondary | ICD-10-CM | POA: Diagnosis not present

## 2022-05-26 DIAGNOSIS — F0150 Vascular dementia without behavioral disturbance: Secondary | ICD-10-CM | POA: Diagnosis not present

## 2022-05-29 DIAGNOSIS — F0150 Vascular dementia without behavioral disturbance: Secondary | ICD-10-CM | POA: Diagnosis not present

## 2022-05-30 DIAGNOSIS — F0150 Vascular dementia without behavioral disturbance: Secondary | ICD-10-CM | POA: Diagnosis not present

## 2022-05-31 DIAGNOSIS — F0150 Vascular dementia without behavioral disturbance: Secondary | ICD-10-CM | POA: Diagnosis not present

## 2022-06-01 DIAGNOSIS — F0150 Vascular dementia without behavioral disturbance: Secondary | ICD-10-CM | POA: Diagnosis not present

## 2022-06-02 DIAGNOSIS — F0150 Vascular dementia without behavioral disturbance: Secondary | ICD-10-CM | POA: Diagnosis not present

## 2022-06-06 DIAGNOSIS — F0150 Vascular dementia without behavioral disturbance: Secondary | ICD-10-CM | POA: Diagnosis not present

## 2022-06-07 DIAGNOSIS — F0150 Vascular dementia without behavioral disturbance: Secondary | ICD-10-CM | POA: Diagnosis not present

## 2022-06-08 DIAGNOSIS — F0150 Vascular dementia without behavioral disturbance: Secondary | ICD-10-CM | POA: Diagnosis not present

## 2022-06-09 DIAGNOSIS — F0150 Vascular dementia without behavioral disturbance: Secondary | ICD-10-CM | POA: Diagnosis not present

## 2022-06-10 DIAGNOSIS — F0390 Unspecified dementia without behavioral disturbance: Secondary | ICD-10-CM | POA: Diagnosis not present

## 2022-06-10 DIAGNOSIS — R32 Unspecified urinary incontinence: Secondary | ICD-10-CM | POA: Diagnosis not present

## 2022-06-14 DIAGNOSIS — F0150 Vascular dementia without behavioral disturbance: Secondary | ICD-10-CM | POA: Diagnosis not present

## 2022-06-15 DIAGNOSIS — F0150 Vascular dementia without behavioral disturbance: Secondary | ICD-10-CM | POA: Diagnosis not present

## 2022-06-16 DIAGNOSIS — F0150 Vascular dementia without behavioral disturbance: Secondary | ICD-10-CM | POA: Diagnosis not present

## 2022-06-19 DIAGNOSIS — F0150 Vascular dementia without behavioral disturbance: Secondary | ICD-10-CM | POA: Diagnosis not present

## 2022-06-20 DIAGNOSIS — F0150 Vascular dementia without behavioral disturbance: Secondary | ICD-10-CM | POA: Diagnosis not present

## 2022-06-21 DIAGNOSIS — F0150 Vascular dementia without behavioral disturbance: Secondary | ICD-10-CM | POA: Diagnosis not present

## 2022-06-22 DIAGNOSIS — F0150 Vascular dementia without behavioral disturbance: Secondary | ICD-10-CM | POA: Diagnosis not present

## 2022-06-23 DIAGNOSIS — F0150 Vascular dementia without behavioral disturbance: Secondary | ICD-10-CM | POA: Diagnosis not present

## 2022-06-26 DIAGNOSIS — F0150 Vascular dementia without behavioral disturbance: Secondary | ICD-10-CM | POA: Diagnosis not present

## 2022-06-27 DIAGNOSIS — F0150 Vascular dementia without behavioral disturbance: Secondary | ICD-10-CM | POA: Diagnosis not present

## 2022-06-28 DIAGNOSIS — F0150 Vascular dementia without behavioral disturbance: Secondary | ICD-10-CM | POA: Diagnosis not present

## 2022-06-29 DIAGNOSIS — F0150 Vascular dementia without behavioral disturbance: Secondary | ICD-10-CM | POA: Diagnosis not present

## 2022-06-30 DIAGNOSIS — F0150 Vascular dementia without behavioral disturbance: Secondary | ICD-10-CM | POA: Diagnosis not present

## 2022-07-07 ENCOUNTER — Emergency Department (HOSPITAL_COMMUNITY)
Admission: EM | Admit: 2022-07-07 | Discharge: 2022-07-08 | Disposition: A | Discharge status: Home / Self Care | Payer: Medicaid Other | Attending: Emergency Medicine | Admitting: Emergency Medicine

## 2022-07-07 ENCOUNTER — Emergency Department (HOSPITAL_COMMUNITY): Payer: Medicaid Other

## 2022-07-07 ENCOUNTER — Encounter (HOSPITAL_COMMUNITY): Payer: Self-pay

## 2022-07-07 ENCOUNTER — Other Ambulatory Visit: Payer: Self-pay

## 2022-07-07 DIAGNOSIS — R739 Hyperglycemia, unspecified: Secondary | ICD-10-CM | POA: Diagnosis not present

## 2022-07-07 DIAGNOSIS — F0390 Unspecified dementia without behavioral disturbance: Secondary | ICD-10-CM | POA: Insufficient documentation

## 2022-07-07 DIAGNOSIS — W19XXXA Unspecified fall, initial encounter: Secondary | ICD-10-CM | POA: Diagnosis not present

## 2022-07-07 DIAGNOSIS — S0990XA Unspecified injury of head, initial encounter: Secondary | ICD-10-CM | POA: Diagnosis not present

## 2022-07-07 DIAGNOSIS — Z79899 Other long term (current) drug therapy: Secondary | ICD-10-CM | POA: Insufficient documentation

## 2022-07-07 DIAGNOSIS — R531 Weakness: Secondary | ICD-10-CM | POA: Diagnosis not present

## 2022-07-07 DIAGNOSIS — R000 Tachycardia, unspecified: Secondary | ICD-10-CM | POA: Diagnosis not present

## 2022-07-07 DIAGNOSIS — I10 Essential (primary) hypertension: Secondary | ICD-10-CM | POA: Insufficient documentation

## 2022-07-07 DIAGNOSIS — F0150 Vascular dementia without behavioral disturbance: Secondary | ICD-10-CM | POA: Diagnosis not present

## 2022-07-07 DIAGNOSIS — I6381 Other cerebral infarction due to occlusion or stenosis of small artery: Secondary | ICD-10-CM | POA: Diagnosis not present

## 2022-07-07 LAB — COMPREHENSIVE METABOLIC PANEL
ALT: 19 U/L (ref 0–44)
AST: 19 U/L (ref 15–41)
Albumin: 3.5 g/dL (ref 3.5–5.0)
Alkaline Phosphatase: 61 U/L (ref 38–126)
Anion gap: 9 (ref 5–15)
BUN: 17 mg/dL (ref 8–23)
CO2: 25 mmol/L (ref 22–32)
Calcium: 8.9 mg/dL (ref 8.9–10.3)
Chloride: 106 mmol/L (ref 98–111)
Creatinine, Ser: 1.48 mg/dL — ABNORMAL HIGH (ref 0.44–1.00)
GFR, Estimated: 40 mL/min — ABNORMAL LOW (ref >60)
Glucose, Bld: 203 mg/dL — ABNORMAL HIGH (ref 70–99)
Potassium: 4.2 mmol/L (ref 3.5–5.1)
Sodium: 140 mmol/L (ref 135–145)
Total Bilirubin: 0.5 mg/dL (ref 0.3–1.2)
Total Protein: 7.3 g/dL (ref 6.5–8.1)

## 2022-07-07 LAB — MAGNESIUM: Magnesium: 2.1 mg/dL (ref 1.7–2.4)

## 2022-07-07 LAB — CBC WITH DIFFERENTIAL/PLATELET
Abs Immature Granulocytes: 0.02 10*3/uL (ref 0.00–0.07)
Basophils Absolute: 0 10*3/uL (ref 0.0–0.1)
Basophils Relative: 0 %
Eosinophils Absolute: 0 10*3/uL (ref 0.0–0.5)
Eosinophils Relative: 0 %
HCT: 37.1 % (ref 36.0–46.0)
Hemoglobin: 11.3 g/dL — ABNORMAL LOW (ref 12.0–15.0)
Immature Granulocytes: 0 %
Lymphocytes Relative: 11 %
Lymphs Abs: 0.8 10*3/uL (ref 0.7–4.0)
MCH: 22.3 pg — ABNORMAL LOW (ref 26.0–34.0)
MCHC: 30.5 g/dL (ref 30.0–36.0)
MCV: 73.2 fL — ABNORMAL LOW (ref 80.0–100.0)
Monocytes Absolute: 0.4 10*3/uL (ref 0.1–1.0)
Monocytes Relative: 6 %
Neutro Abs: 5.9 10*3/uL (ref 1.7–7.7)
Neutrophils Relative %: 83 %
Platelets: 184 10*3/uL (ref 150–400)
RBC: 5.07 MIL/uL (ref 3.87–5.11)
RDW: 17.2 % — ABNORMAL HIGH (ref 11.5–15.5)
WBC: 7.2 10*3/uL (ref 4.0–10.5)
nRBC: 0 % (ref 0.0–0.2)

## 2022-07-07 LAB — TROPONIN I (HIGH SENSITIVITY)
Troponin I (High Sensitivity): 4 ng/L (ref <18)
Troponin I (High Sensitivity): 4 ng/L (ref <18)

## 2022-07-07 LAB — LACTIC ACID, PLASMA: Lactic Acid, Venous: 1.9 mmol/L (ref 0.5–1.9)

## 2022-07-07 MED ORDER — LACTATED RINGERS IV BOLUS
1000.0000 mL | Freq: Once | INTRAVENOUS | Status: AC
  Administered 2022-07-07: 1000 mL via INTRAVENOUS

## 2022-07-07 NOTE — ED Triage Notes (Signed)
PER EMS: pt is from home with c/o fall from a standing position, + head injury, does not take blood thinners. Denies LOC, pt remembers events. Denies neck or back pain.Hx of dementia and is at baseline mentation. Initial 96/50, HR-60 afib 450cc NS en route

## 2022-07-07 NOTE — ED Provider Notes (Signed)
[facility] Provider Note   CSN: [id number] Arrival date & time: 07/07/22  1648     History {Add pertinent medical, surgical, social history, OB history to HPI:1} No chief complaint on file.   Erica Lopez is a 62 y.o. female.  This is a 62 year old female with history of stroke, vascular dementia, malnutrition and hypertension presented to the ED for fall.  Patient was reportedly getting up to go to the bathroom when she fell backwards and hit her head.  She did not lose consciousness.  Patient has intermittent malnourishment, frequently missing meals and not maintaining proper calorie intake.  She denies any headaches, changes in her vision, chest pain, shortness of breath, abdominal pain.    Home Medications Prior to Admission medications   Medication Sig Start Date End Date Taking? Authorizing Provider  Misc. Devices [place] chair 04/05/22   Charlott Rakes, MD  Misc. Devices MISC Bedside Commode 04/05/22   Charlott Rakes, MD  amLODipine (NORVASC) 10 MG tablet Take 1 tablet (10 mg total) by mouth daily. 02/28/22   Charlott Rakes, MD  atorvastatin (LIPITOR) 20 MG tablet Take 1 tablet (20 mg total) by mouth daily. 02/28/22   Charlott Rakes, MD  carvedilol (COREG) 3.125 MG tablet Take 1 tablet (3.125 mg total) by mouth 2 (two) times daily with a meal. 02/28/22   Charlott Rakes, MD  losartan (COZAAR) 25 MG tablet Take 1 tablet (25 mg total) by mouth daily. 02/28/22   Charlott Rakes, MD  triamcinolone (KENALOG) 0.025 % ointment Apply 1 application topically 2 (two) times daily. 12/03/20   Hughie Closs, PA-C      Allergies    Aspirin, Lisinopril, and Other    Review of Systems   Review of Systems  Constitutional:  Negative for fatigue and fever.  Respiratory:  Negative for cough and shortness of breath.   Cardiovascular:  Negative for chest pain.  Gastrointestinal:  Negative for abdominal pain.  Genitourinary:  Negative  for flank pain.  Neurological:  Positive for weakness. Negative for dizziness.    Physical Exam Updated Vital Signs Ht 5' 3"$  (1.6 m)   Wt 40.8 kg   BMI 15.94 kg/m  Physical Exam Constitutional:      Comments: Thin, appears chronically malnourished  HENT:     Head: Normocephalic and atraumatic.     Nose: Nose normal.     Mouth/Throat:     Mouth: Mucous membranes are moist.  Eyes:     Pupils: Pupils are equal, round, and reactive to light.  Cardiovascular:     Rate and Rhythm: Normal rate and regular rhythm.     Pulses: Normal pulses.     Heart sounds: Normal heart sounds. No murmur heard.    No friction rub. No gallop.  Pulmonary:     Effort: Pulmonary effort is normal. No respiratory distress.     Breath sounds: No wheezing or rales.  Chest:     Chest wall: No tenderness.  Abdominal:     General: There is no distension.     Palpations: Abdomen is soft.     Tenderness: There is no abdominal tenderness. There is no guarding or rebound.  Musculoskeletal:     Right lower leg: No edema.     Left lower leg: No edema.  Skin:    General: Skin is warm.     Capillary Refill: Capillary refill takes less than 2 seconds.  Neurological:     General: No focal deficit  present.     Mental Status: Mental status is at baseline.     ED Results / Procedures / Treatments   Labs (all labs ordered are listed, but only abnormal results are displayed) Labs Reviewed - No data to display  EKG EKG Interpretation  Date/Time:  Friday July 07 2022 16:56:19 EST Ventricular Rate:  143 PR Interval:    QRS Duration: 153 QT Interval:  373 QTC Calculation: 482 R Axis:   1 Text Interpretation: Atrial fibrillation Paired ventricular premature complexes Probable left ventricular hypertrophy Anterior Q waves, possibly due to LVH Confirmed by Nanda Quinton [phone]) on 07/07/2022 5:01:06 PM  Radiology No results found.  Procedures Procedures  {Document cardiac monitor, telemetry assessment  procedure when appropriate:1}  Medications Ordered in ED Medications - No data to display  ED Course/ Medical Decision Making/ A&P   {   Click here for ABCD2, HEART and other calculatorsREFRESH Note before signing :1}                          Medical Decision Making Patient presents after a fall, unknown if this is presyncopal versus weakness.  She was also slightly hypotensive upon arrival with EMS.  Will obtain a cardiac workup including serial troponins and EKG.  Will obtain a CT head due to her history of stroke and to evaluate for hemorrhage.  Since she was hypotensive with EMS, 1 L LR ordered, I also ordered a lactate as a screening test for occult sepsis.  Urinalysis, CBC, CMP and magnesium ordered.  Differential diagnosis includes sepsis, ACS, dysrhythmia, urinary tract infection, intracranial hemorrhage, malnourishment  Amount and/or Complexity of Data Reviewed Labs: ordered. Radiology: ordered.   ***  {Document critical care time when appropriate:1} {Document review of labs and clinical decision tools ie heart score, Chads2Vasc2 etc:1}  {Document your independent review of radiology images, and any outside records:1} {Document your discussion with family members, caretakers, and with consultants:1} {Document social determinants of health affecting pt's care:1} {Document your decision making why or why not admission, treatments were needed:1} Final Clinical Impression(s) / ED Diagnoses Final diagnoses:  None    Rx / DC Orders ED Discharge Orders     None

## 2022-07-08 LAB — URINALYSIS, ROUTINE W REFLEX MICROSCOPIC
Bilirubin Urine: NEGATIVE
Glucose, UA: NEGATIVE mg/dL
Hgb urine dipstick: NEGATIVE
Ketones, ur: NEGATIVE mg/dL
Leukocytes,Ua: NEGATIVE
Nitrite: NEGATIVE
Protein, ur: NEGATIVE mg/dL
Specific Gravity, Urine: 1.01 (ref 1.005–1.030)
pH: 6 (ref 5.0–8.0)

## 2022-07-08 NOTE — Discharge Instructions (Signed)
You were evaluated in the emergency department, your CT scan of your head was normal and did not show any bleeding.  Your labs were all normal, you had no urinary tract infection or other abnormalities.  Please follow-up with your PCP outpatient.  Return to the ED if you have any syncopal episodes, chest pain, shortness of breath or inability to tolerate oral intake.

## 2022-07-10 DIAGNOSIS — F0150 Vascular dementia without behavioral disturbance: Secondary | ICD-10-CM | POA: Diagnosis not present

## 2022-07-11 ENCOUNTER — Telehealth: Payer: Self-pay | Admitting: Licensed Clinical Social Worker

## 2022-07-11 DIAGNOSIS — F0150 Vascular dementia without behavioral disturbance: Secondary | ICD-10-CM | POA: Diagnosis not present

## 2022-07-11 NOTE — Patient Outreach (Signed)
Transition Care Management Unsuccessful Follow-up Telephone Call  Date of discharge and from where:  [facility]  Attempts:  1st Attempt  Reason for unsuccessful TCM follow-up call:  Unable to reach patient. Patient's daughter answered and reports that this is the correct number but that she is not with patient at this time. Family will return call once patient is present.  Eula Fried, BSW, MSW, [employer] LCSW [email or web address] Phone: [phone]

## 2022-07-12 DIAGNOSIS — F0150 Vascular dementia without behavioral disturbance: Secondary | ICD-10-CM | POA: Diagnosis not present

## 2022-07-13 DIAGNOSIS — F0150 Vascular dementia without behavioral disturbance: Secondary | ICD-10-CM | POA: Diagnosis not present

## 2022-07-14 DIAGNOSIS — F0150 Vascular dementia without behavioral disturbance: Secondary | ICD-10-CM | POA: Diagnosis not present

## 2022-07-17 DIAGNOSIS — F0150 Vascular dementia without behavioral disturbance: Secondary | ICD-10-CM | POA: Diagnosis not present

## 2022-07-18 DIAGNOSIS — F0150 Vascular dementia without behavioral disturbance: Secondary | ICD-10-CM | POA: Diagnosis not present

## 2022-07-19 DIAGNOSIS — F0150 Vascular dementia without behavioral disturbance: Secondary | ICD-10-CM | POA: Diagnosis not present

## 2022-07-20 DIAGNOSIS — F0150 Vascular dementia without behavioral disturbance: Secondary | ICD-10-CM | POA: Diagnosis not present

## 2022-07-21 DIAGNOSIS — F0150 Vascular dementia without behavioral disturbance: Secondary | ICD-10-CM | POA: Diagnosis not present

## 2022-07-24 DIAGNOSIS — F0150 Vascular dementia without behavioral disturbance: Secondary | ICD-10-CM | POA: Diagnosis not present

## 2022-07-25 DIAGNOSIS — F0150 Vascular dementia without behavioral disturbance: Secondary | ICD-10-CM | POA: Diagnosis not present

## 2022-07-26 DIAGNOSIS — F0150 Vascular dementia without behavioral disturbance: Secondary | ICD-10-CM | POA: Diagnosis not present

## 2022-07-27 DIAGNOSIS — F0150 Vascular dementia without behavioral disturbance: Secondary | ICD-10-CM | POA: Diagnosis not present

## 2022-07-28 DIAGNOSIS — F0150 Vascular dementia without behavioral disturbance: Secondary | ICD-10-CM | POA: Diagnosis not present

## 2022-07-31 DIAGNOSIS — F0150 Vascular dementia without behavioral disturbance: Secondary | ICD-10-CM | POA: Diagnosis not present

## 2022-08-01 DIAGNOSIS — F0150 Vascular dementia without behavioral disturbance: Secondary | ICD-10-CM | POA: Diagnosis not present

## 2022-08-02 DIAGNOSIS — F0150 Vascular dementia without behavioral disturbance: Secondary | ICD-10-CM | POA: Diagnosis not present

## 2022-08-03 DIAGNOSIS — F0150 Vascular dementia without behavioral disturbance: Secondary | ICD-10-CM | POA: Diagnosis not present

## 2022-08-04 DIAGNOSIS — F0150 Vascular dementia without behavioral disturbance: Secondary | ICD-10-CM | POA: Diagnosis not present

## 2022-08-07 DIAGNOSIS — F0150 Vascular dementia without behavioral disturbance: Secondary | ICD-10-CM | POA: Diagnosis not present

## 2022-08-08 DIAGNOSIS — F0150 Vascular dementia without behavioral disturbance: Secondary | ICD-10-CM | POA: Diagnosis not present

## 2022-08-09 DIAGNOSIS — F0150 Vascular dementia without behavioral disturbance: Secondary | ICD-10-CM | POA: Diagnosis not present

## 2022-08-10 DIAGNOSIS — F0150 Vascular dementia without behavioral disturbance: Secondary | ICD-10-CM | POA: Diagnosis not present

## 2022-08-11 DIAGNOSIS — F0150 Vascular dementia without behavioral disturbance: Secondary | ICD-10-CM | POA: Diagnosis not present

## 2022-08-12 DIAGNOSIS — R32 Unspecified urinary incontinence: Secondary | ICD-10-CM | POA: Diagnosis not present

## 2022-08-12 DIAGNOSIS — F0390 Unspecified dementia without behavioral disturbance: Secondary | ICD-10-CM | POA: Diagnosis not present

## 2022-08-14 DIAGNOSIS — F0150 Vascular dementia without behavioral disturbance: Secondary | ICD-10-CM | POA: Diagnosis not present

## 2022-08-15 DIAGNOSIS — F0150 Vascular dementia without behavioral disturbance: Secondary | ICD-10-CM | POA: Diagnosis not present

## 2022-08-16 DIAGNOSIS — F0150 Vascular dementia without behavioral disturbance: Secondary | ICD-10-CM | POA: Diagnosis not present

## 2022-08-17 DIAGNOSIS — F0150 Vascular dementia without behavioral disturbance: Secondary | ICD-10-CM | POA: Diagnosis not present

## 2022-08-18 DIAGNOSIS — F0150 Vascular dementia without behavioral disturbance: Secondary | ICD-10-CM | POA: Diagnosis not present

## 2022-08-21 DIAGNOSIS — F0150 Vascular dementia without behavioral disturbance: Secondary | ICD-10-CM | POA: Diagnosis not present

## 2022-08-22 DIAGNOSIS — F0150 Vascular dementia without behavioral disturbance: Secondary | ICD-10-CM | POA: Diagnosis not present

## 2022-08-23 DIAGNOSIS — F0150 Vascular dementia without behavioral disturbance: Secondary | ICD-10-CM | POA: Diagnosis not present

## 2022-08-24 DIAGNOSIS — F0150 Vascular dementia without behavioral disturbance: Secondary | ICD-10-CM | POA: Diagnosis not present

## 2022-08-25 DIAGNOSIS — F0150 Vascular dementia without behavioral disturbance: Secondary | ICD-10-CM | POA: Diagnosis not present

## 2022-08-28 DIAGNOSIS — F0150 Vascular dementia without behavioral disturbance: Secondary | ICD-10-CM | POA: Diagnosis not present

## 2022-08-29 DIAGNOSIS — F0150 Vascular dementia without behavioral disturbance: Secondary | ICD-10-CM | POA: Diagnosis not present

## 2022-08-30 ENCOUNTER — Ambulatory Visit: Payer: Medicaid Other | Admitting: Family Medicine

## 2022-08-30 DIAGNOSIS — F0150 Vascular dementia without behavioral disturbance: Secondary | ICD-10-CM | POA: Diagnosis not present

## 2022-08-31 DIAGNOSIS — F0150 Vascular dementia without behavioral disturbance: Secondary | ICD-10-CM | POA: Diagnosis not present

## 2022-09-01 DIAGNOSIS — F0150 Vascular dementia without behavioral disturbance: Secondary | ICD-10-CM | POA: Diagnosis not present

## 2022-09-04 DIAGNOSIS — F0150 Vascular dementia without behavioral disturbance: Secondary | ICD-10-CM | POA: Diagnosis not present

## 2022-09-05 DIAGNOSIS — F0150 Vascular dementia without behavioral disturbance: Secondary | ICD-10-CM | POA: Diagnosis not present

## 2022-09-06 DIAGNOSIS — F0150 Vascular dementia without behavioral disturbance: Secondary | ICD-10-CM | POA: Diagnosis not present

## 2022-09-07 DIAGNOSIS — F0150 Vascular dementia without behavioral disturbance: Secondary | ICD-10-CM | POA: Diagnosis not present

## 2022-09-08 DIAGNOSIS — F0150 Vascular dementia without behavioral disturbance: Secondary | ICD-10-CM | POA: Diagnosis not present

## 2022-09-11 DIAGNOSIS — F0150 Vascular dementia without behavioral disturbance: Secondary | ICD-10-CM | POA: Diagnosis not present

## 2022-09-12 DIAGNOSIS — F0150 Vascular dementia without behavioral disturbance: Secondary | ICD-10-CM | POA: Diagnosis not present

## 2022-09-13 DIAGNOSIS — F0150 Vascular dementia without behavioral disturbance: Secondary | ICD-10-CM | POA: Diagnosis not present

## 2022-09-14 DIAGNOSIS — F0150 Vascular dementia without behavioral disturbance: Secondary | ICD-10-CM | POA: Diagnosis not present

## 2022-09-18 DIAGNOSIS — F0150 Vascular dementia without behavioral disturbance: Secondary | ICD-10-CM | POA: Diagnosis not present

## 2022-09-19 DIAGNOSIS — F0150 Vascular dementia without behavioral disturbance: Secondary | ICD-10-CM | POA: Diagnosis not present

## 2022-09-20 DIAGNOSIS — F0150 Vascular dementia without behavioral disturbance: Secondary | ICD-10-CM | POA: Diagnosis not present

## 2022-09-21 DIAGNOSIS — F0150 Vascular dementia without behavioral disturbance: Secondary | ICD-10-CM | POA: Diagnosis not present

## 2022-09-22 DIAGNOSIS — F0150 Vascular dementia without behavioral disturbance: Secondary | ICD-10-CM | POA: Diagnosis not present

## 2022-09-25 DIAGNOSIS — F0150 Vascular dementia without behavioral disturbance: Secondary | ICD-10-CM | POA: Diagnosis not present

## 2022-09-26 DIAGNOSIS — F0150 Vascular dementia without behavioral disturbance: Secondary | ICD-10-CM | POA: Diagnosis not present

## 2022-09-27 DIAGNOSIS — F0150 Vascular dementia without behavioral disturbance: Secondary | ICD-10-CM | POA: Diagnosis not present

## 2022-09-28 DIAGNOSIS — F0150 Vascular dementia without behavioral disturbance: Secondary | ICD-10-CM | POA: Diagnosis not present

## 2022-09-29 DIAGNOSIS — F0150 Vascular dementia without behavioral disturbance: Secondary | ICD-10-CM | POA: Diagnosis not present

## 2022-10-02 DIAGNOSIS — F0150 Vascular dementia without behavioral disturbance: Secondary | ICD-10-CM | POA: Diagnosis not present

## 2022-10-03 DIAGNOSIS — F0150 Vascular dementia without behavioral disturbance: Secondary | ICD-10-CM | POA: Diagnosis not present

## 2022-10-04 DIAGNOSIS — F0150 Vascular dementia without behavioral disturbance: Secondary | ICD-10-CM | POA: Diagnosis not present

## 2022-10-05 DIAGNOSIS — F0150 Vascular dementia without behavioral disturbance: Secondary | ICD-10-CM | POA: Diagnosis not present

## 2022-10-06 DIAGNOSIS — F0150 Vascular dementia without behavioral disturbance: Secondary | ICD-10-CM | POA: Diagnosis not present

## 2022-10-09 DIAGNOSIS — F0150 Vascular dementia without behavioral disturbance: Secondary | ICD-10-CM | POA: Diagnosis not present

## 2022-10-10 DIAGNOSIS — F0150 Vascular dementia without behavioral disturbance: Secondary | ICD-10-CM | POA: Diagnosis not present

## 2022-10-11 DIAGNOSIS — F0150 Vascular dementia without behavioral disturbance: Secondary | ICD-10-CM | POA: Diagnosis not present

## 2022-10-12 DIAGNOSIS — F0150 Vascular dementia without behavioral disturbance: Secondary | ICD-10-CM | POA: Diagnosis not present

## 2022-10-13 DIAGNOSIS — F0150 Vascular dementia without behavioral disturbance: Secondary | ICD-10-CM | POA: Diagnosis not present

## 2022-10-16 DIAGNOSIS — F0150 Vascular dementia without behavioral disturbance: Secondary | ICD-10-CM | POA: Diagnosis not present

## 2022-10-17 DIAGNOSIS — F0150 Vascular dementia without behavioral disturbance: Secondary | ICD-10-CM | POA: Diagnosis not present

## 2022-10-18 DIAGNOSIS — F0150 Vascular dementia without behavioral disturbance: Secondary | ICD-10-CM | POA: Diagnosis not present

## 2022-10-18 DIAGNOSIS — F0390 Unspecified dementia without behavioral disturbance: Secondary | ICD-10-CM | POA: Diagnosis not present

## 2022-10-18 DIAGNOSIS — R32 Unspecified urinary incontinence: Secondary | ICD-10-CM | POA: Diagnosis not present

## 2022-10-19 DIAGNOSIS — F0150 Vascular dementia without behavioral disturbance: Secondary | ICD-10-CM | POA: Diagnosis not present

## 2022-10-20 DIAGNOSIS — F0150 Vascular dementia without behavioral disturbance: Secondary | ICD-10-CM | POA: Diagnosis not present

## 2022-10-23 DIAGNOSIS — F0150 Vascular dementia without behavioral disturbance: Secondary | ICD-10-CM | POA: Diagnosis not present

## 2022-10-24 DIAGNOSIS — F0150 Vascular dementia without behavioral disturbance: Secondary | ICD-10-CM | POA: Diagnosis not present

## 2022-10-25 DIAGNOSIS — F0150 Vascular dementia without behavioral disturbance: Secondary | ICD-10-CM | POA: Diagnosis not present

## 2022-10-26 DIAGNOSIS — F0150 Vascular dementia without behavioral disturbance: Secondary | ICD-10-CM | POA: Diagnosis not present

## 2022-10-30 DIAGNOSIS — F0150 Vascular dementia without behavioral disturbance: Secondary | ICD-10-CM | POA: Diagnosis not present

## 2022-10-31 DIAGNOSIS — F0150 Vascular dementia without behavioral disturbance: Secondary | ICD-10-CM | POA: Diagnosis not present

## 2022-11-01 DIAGNOSIS — F0150 Vascular dementia without behavioral disturbance: Secondary | ICD-10-CM | POA: Diagnosis not present

## 2022-11-02 DIAGNOSIS — F0150 Vascular dementia without behavioral disturbance: Secondary | ICD-10-CM | POA: Diagnosis not present

## 2022-11-03 DIAGNOSIS — F0150 Vascular dementia without behavioral disturbance: Secondary | ICD-10-CM | POA: Diagnosis not present

## 2022-11-06 DIAGNOSIS — F0150 Vascular dementia without behavioral disturbance: Secondary | ICD-10-CM | POA: Diagnosis not present

## 2022-11-07 DIAGNOSIS — F0150 Vascular dementia without behavioral disturbance: Secondary | ICD-10-CM | POA: Diagnosis not present

## 2022-11-08 DIAGNOSIS — F0150 Vascular dementia without behavioral disturbance: Secondary | ICD-10-CM | POA: Diagnosis not present

## 2022-11-09 DIAGNOSIS — F0150 Vascular dementia without behavioral disturbance: Secondary | ICD-10-CM | POA: Diagnosis not present

## 2022-11-13 DIAGNOSIS — F0150 Vascular dementia without behavioral disturbance: Secondary | ICD-10-CM | POA: Diagnosis not present

## 2022-11-14 DIAGNOSIS — F0150 Vascular dementia without behavioral disturbance: Secondary | ICD-10-CM | POA: Diagnosis not present

## 2022-11-15 DIAGNOSIS — F0150 Vascular dementia without behavioral disturbance: Secondary | ICD-10-CM | POA: Diagnosis not present

## 2022-11-16 DIAGNOSIS — F0150 Vascular dementia without behavioral disturbance: Secondary | ICD-10-CM | POA: Diagnosis not present

## 2022-11-17 DIAGNOSIS — F0150 Vascular dementia without behavioral disturbance: Secondary | ICD-10-CM | POA: Diagnosis not present

## 2022-11-20 DIAGNOSIS — F0150 Vascular dementia without behavioral disturbance: Secondary | ICD-10-CM | POA: Diagnosis not present

## 2022-11-21 DIAGNOSIS — F0150 Vascular dementia without behavioral disturbance: Secondary | ICD-10-CM | POA: Diagnosis not present

## 2022-11-22 DIAGNOSIS — F0150 Vascular dementia without behavioral disturbance: Secondary | ICD-10-CM | POA: Diagnosis not present

## 2022-11-24 DIAGNOSIS — F0150 Vascular dementia without behavioral disturbance: Secondary | ICD-10-CM | POA: Diagnosis not present

## 2022-11-27 DIAGNOSIS — F0150 Vascular dementia without behavioral disturbance: Secondary | ICD-10-CM | POA: Diagnosis not present

## 2022-11-28 DIAGNOSIS — F0150 Vascular dementia without behavioral disturbance: Secondary | ICD-10-CM | POA: Diagnosis not present

## 2022-11-29 DIAGNOSIS — F0150 Vascular dementia without behavioral disturbance: Secondary | ICD-10-CM | POA: Diagnosis not present

## 2022-11-30 DIAGNOSIS — F0150 Vascular dementia without behavioral disturbance: Secondary | ICD-10-CM | POA: Diagnosis not present

## 2022-12-01 DIAGNOSIS — F0150 Vascular dementia without behavioral disturbance: Secondary | ICD-10-CM | POA: Diagnosis not present

## 2022-12-04 DIAGNOSIS — F0150 Vascular dementia without behavioral disturbance: Secondary | ICD-10-CM | POA: Diagnosis not present

## 2022-12-05 DIAGNOSIS — F0150 Vascular dementia without behavioral disturbance: Secondary | ICD-10-CM | POA: Diagnosis not present

## 2022-12-06 DIAGNOSIS — F0150 Vascular dementia without behavioral disturbance: Secondary | ICD-10-CM | POA: Diagnosis not present

## 2022-12-07 DIAGNOSIS — F0150 Vascular dementia without behavioral disturbance: Secondary | ICD-10-CM | POA: Diagnosis not present

## 2022-12-08 DIAGNOSIS — F0150 Vascular dementia without behavioral disturbance: Secondary | ICD-10-CM | POA: Diagnosis not present

## 2022-12-11 DIAGNOSIS — F0150 Vascular dementia without behavioral disturbance: Secondary | ICD-10-CM | POA: Diagnosis not present

## 2022-12-12 DIAGNOSIS — F0150 Vascular dementia without behavioral disturbance: Secondary | ICD-10-CM | POA: Diagnosis not present

## 2022-12-13 DIAGNOSIS — F0150 Vascular dementia without behavioral disturbance: Secondary | ICD-10-CM | POA: Diagnosis not present

## 2022-12-14 DIAGNOSIS — F0150 Vascular dementia without behavioral disturbance: Secondary | ICD-10-CM | POA: Diagnosis not present

## 2022-12-15 DIAGNOSIS — F0150 Vascular dementia without behavioral disturbance: Secondary | ICD-10-CM | POA: Diagnosis not present

## 2022-12-18 DIAGNOSIS — F0150 Vascular dementia without behavioral disturbance: Secondary | ICD-10-CM | POA: Diagnosis not present

## 2022-12-19 DIAGNOSIS — F0150 Vascular dementia without behavioral disturbance: Secondary | ICD-10-CM | POA: Diagnosis not present

## 2022-12-20 DIAGNOSIS — F0150 Vascular dementia without behavioral disturbance: Secondary | ICD-10-CM | POA: Diagnosis not present

## 2022-12-21 ENCOUNTER — Other Ambulatory Visit: Payer: Self-pay | Admitting: Family Medicine

## 2022-12-21 DIAGNOSIS — F0150 Vascular dementia without behavioral disturbance: Secondary | ICD-10-CM | POA: Diagnosis not present

## 2022-12-21 DIAGNOSIS — E7800 Pure hypercholesterolemia, unspecified: Secondary | ICD-10-CM

## 2022-12-21 DIAGNOSIS — I10 Essential (primary) hypertension: Secondary | ICD-10-CM

## 2022-12-21 NOTE — Telephone Encounter (Signed)
Medication Refill - Medication: losartan (COZAAR) 25 MG tablet   atorvastatin (LIPITOR) 20 MG tabletcarvedilol (COREG) 3.125 MG tabletamLODipine (NORVASC) 10 MG tablet  Has the patient contacted their pharmacy? No.  Preferred Pharmacy (with phone number or street name):  [facility] [address] [place], [place] - [address]  [address] [place] [address]  Phone: [phone] Fax: [phone]  Hours: Not open 24 hours       Has the patient been seen for an appointment in the last year OR does the patient have an upcoming appointment? Yes.    Agent: Please be advised that RX refills may take up to 3 business days. We ask that you follow-up with your pharmacy.

## 2022-12-22 DIAGNOSIS — F0150 Vascular dementia without behavioral disturbance: Secondary | ICD-10-CM | POA: Diagnosis not present

## 2022-12-22 MED ORDER — CARVEDILOL 3.125 MG PO TABS: 3.1250 mg | ORAL_TABLET | Freq: Two times a day (BID) | ORAL | 0 refills | Status: DC

## 2022-12-22 MED ORDER — ATORVASTATIN CALCIUM 20 MG PO TABS: 20.0000 mg | ORAL_TABLET | Freq: Every day | ORAL | 0 refills | Status: DC

## 2022-12-22 MED ORDER — AMLODIPINE BESYLATE 10 MG PO TABS: 10.0000 mg | ORAL_TABLET | Freq: Every day | ORAL | 0 refills | Status: DC

## 2022-12-22 MED ORDER — LOSARTAN POTASSIUM 25 MG PO TABS: 25.0000 mg | ORAL_TABLET | Freq: Every day | ORAL | 0 refills | Status: DC

## 2022-12-22 NOTE — Telephone Encounter (Signed)
Requested medication (s) are due for refill today:   Yes for all 4  Requested medication (s) are on the active medication list:   Yes for all 4  Future visit scheduled:   Yes 8/28 with Dr. Alvis Lemmings   Last ordered: All were ordered on 02/28/2022 for 90 days with 1 refill  Returned because labs are due and a 6 month visit is due, has an upcoming appt with Dr. Alvis Lemmings.   Requested Prescriptions  Pending Prescriptions Disp Refills   amLODipine (NORVASC) 10 MG tablet 90 tablet 1    Sig: Take 1 tablet (10 mg total) by mouth daily.     Cardiovascular: Calcium Channel Blockers 2 Failed - 12/21/2022  4:41 PM      Failed - Last BP in normal range    BP Readings from Last 1 Encounters:  07/07/22 (!) 154/103         Failed - Valid encounter within last 6 months    Recent Outpatient Visits           9 months ago Vascular dementia without behavioral disturbance [facility])   [place] [facility] Hoy Register, MD   11 months ago Essential hypertension   [facility] [place], Cornelius Moras, RPH-CPP   1 year ago Pedal edema   [place] [facility] Hoy Register, MD   1 year ago Essential hypertension   [place] [facility] Drucilla Chalet, RPH-CPP   1 year ago Essential hypertension   [place] [facility] [place], Cornelius Moras, RPH-CPP       Future Appointments             In 3 weeks Hoy Register, MD [facility]            Passed - Last Heart Rate in normal range    Pulse Readings from Last 1 Encounters:  07/07/22 85          atorvastatin (LIPITOR) 20 MG tablet 90 tablet 1    Sig: Take 1 tablet (20 mg total) by mouth daily.     Cardiovascular:  Antilipid - Statins Failed - 12/21/2022  4:41 PM      Failed - Lipid Panel in normal range within the last 12 months    Cholesterol, Total  Date Value Ref  Range Status  05/19/2021 224 (H) 100 - 199 mg/dL Final   LDL Chol Calc (NIH)  Date Value Ref Range Status  05/19/2021 126 (H) 0 - 99 mg/dL Final   HDL  Date Value Ref Range Status  05/19/2021 79 >39 mg/dL Final   Triglycerides  Date Value Ref Range Status  05/19/2021 111 0 - 149 mg/dL Final         Passed - Patient is not pregnant      Passed - Valid encounter within last 12 months    Recent Outpatient Visits           9 months ago Vascular dementia without behavioral disturbance (HCC)   [place] [facility] [place], Odette Horns, MD   11 months ago Essential hypertension   [facility] [place], Cornelius Moras, RPH-CPP   1 year ago Pedal edema   [place] [facility] Hoy Register, MD   1 year ago Essential hypertension   [place] [facility]  [facility] [place], Kevil L, RPH-CPP   1 year ago Essential hypertension   [place] [facility] [place], Cornelius Moras, RPH-CPP       Future Appointments             In 3 weeks Hoy Register, MD [facility]             carvedilol (COREG) 3.125 MG tablet 180 tablet 1    Sig: Take 1 tablet (3.125 mg total) by mouth 2 (two) times daily with a meal.     Cardiovascular: Beta Blockers 3 Failed - 12/21/2022  4:41 PM      Failed - Cr in normal range and within 360 days    Creat  Date Value Ref Range Status  08/10/2016 0.98 0.50 - 1.05 mg/dL Final    Comment:      For patients > or = 63 years of age: The upper reference limit for Creatinine is approximately 13% higher for people identified as African-American.      Creatinine, Ser  Date Value Ref Range Status  07/07/2022 1.48 (H) 0.44 - 1.00 mg/dL Final         Failed - Last BP in normal range    BP Readings from Last 1 Encounters:  07/07/22 (!) 154/103         Failed - Valid encounter within last 6 months     Recent Outpatient Visits           9 months ago Vascular dementia without behavioral disturbance (HCC)   [place] [facility] Hoy Register, MD   11 months ago Essential hypertension   [facility] [place], Cornelius Moras, RPH-CPP   1 year ago Pedal edema   [place] [facility] [place], Odette Horns, MD   1 year ago Essential hypertension   [facility] [place], Cornelius Moras, RPH-CPP   1 year ago Essential hypertension   [place] [facility] [place], Cornelius Moras, RPH-CPP       Future Appointments             In 3 weeks Hoy Register, MD [facility]            Passed - AST in normal range and within 360 days    AST  Date Value Ref Range Status  07/07/2022 19 15 - 41 U/L Final         Passed - ALT in normal range and within 360 days    ALT  Date Value Ref Range Status  07/07/2022 19 0 - 44 U/L Final         Passed - Last Heart Rate in normal range    Pulse Readings from Last 1 Encounters:  07/07/22 85          losartan (COZAAR) 25 MG tablet 90 tablet 1    Sig: Take 1 tablet (25 mg total) by mouth daily.     Cardiovascular:  Angiotensin Receptor Blockers Failed - 12/21/2022  4:41 PM      Failed - Cr in normal range and within 180 days    Creat  Date Value Ref Range Status  08/10/2016 0.98 0.50 - 1.05 mg/dL Final    Comment:      For patients > or = 63 years of age: The upper  reference limit for Creatinine is approximately 13% higher for people identified as African-American.      Creatinine, Ser  Date Value Ref Range Status  07/07/2022 1.48 (H) 0.44 - 1.00 mg/dL Final         Failed - Last BP in normal range    BP Readings from Last 1 Encounters:  07/07/22 (!) 154/103         Failed - Valid encounter within last 6 months    Recent Outpatient Visits           9 months  ago Vascular dementia without behavioral disturbance [facility])   [place] [facility] Hoy Register, MD   11 months ago Essential hypertension   [facility] [place], Cornelius Moras, RPH-CPP   1 year ago Pedal edema   [place] [facility] Hoy Register, MD   1 year ago Essential hypertension   [place] [facility] Drucilla Chalet, RPH-CPP   1 year ago Essential hypertension   [place] [facility] [place], Cornelius Moras, RPH-CPP       Future Appointments             In 3 weeks Hoy Register, MD [facility]            Passed - K in normal range and within 180 days    Potassium  Date Value Ref Range Status  07/07/2022 4.2 3.5 - 5.1 mmol/L Final         Passed - Patient is not pregnant

## 2022-12-25 DIAGNOSIS — F0150 Vascular dementia without behavioral disturbance: Secondary | ICD-10-CM | POA: Diagnosis not present

## 2022-12-26 DIAGNOSIS — F0150 Vascular dementia without behavioral disturbance: Secondary | ICD-10-CM | POA: Diagnosis not present

## 2022-12-27 DIAGNOSIS — F0150 Vascular dementia without behavioral disturbance: Secondary | ICD-10-CM | POA: Diagnosis not present

## 2022-12-28 DIAGNOSIS — F0150 Vascular dementia without behavioral disturbance: Secondary | ICD-10-CM | POA: Diagnosis not present

## 2022-12-29 DIAGNOSIS — F0150 Vascular dementia without behavioral disturbance: Secondary | ICD-10-CM | POA: Diagnosis not present

## 2023-01-01 DIAGNOSIS — F0150 Vascular dementia without behavioral disturbance: Secondary | ICD-10-CM | POA: Diagnosis not present

## 2023-01-02 DIAGNOSIS — F0150 Vascular dementia without behavioral disturbance: Secondary | ICD-10-CM | POA: Diagnosis not present

## 2023-01-03 DIAGNOSIS — F0150 Vascular dementia without behavioral disturbance: Secondary | ICD-10-CM | POA: Diagnosis not present

## 2023-01-04 DIAGNOSIS — F0150 Vascular dementia without behavioral disturbance: Secondary | ICD-10-CM | POA: Diagnosis not present

## 2023-01-05 DIAGNOSIS — F0150 Vascular dementia without behavioral disturbance: Secondary | ICD-10-CM | POA: Diagnosis not present

## 2023-01-08 DIAGNOSIS — F0150 Vascular dementia without behavioral disturbance: Secondary | ICD-10-CM | POA: Diagnosis not present

## 2023-01-09 DIAGNOSIS — F0150 Vascular dementia without behavioral disturbance: Secondary | ICD-10-CM | POA: Diagnosis not present

## 2023-01-10 DIAGNOSIS — F0150 Vascular dementia without behavioral disturbance: Secondary | ICD-10-CM | POA: Diagnosis not present

## 2023-01-11 DIAGNOSIS — F0150 Vascular dementia without behavioral disturbance: Secondary | ICD-10-CM | POA: Diagnosis not present

## 2023-01-12 DIAGNOSIS — F0150 Vascular dementia without behavioral disturbance: Secondary | ICD-10-CM | POA: Diagnosis not present

## 2023-01-15 DIAGNOSIS — F0150 Vascular dementia without behavioral disturbance: Secondary | ICD-10-CM | POA: Diagnosis not present

## 2023-01-16 DIAGNOSIS — F0150 Vascular dementia without behavioral disturbance: Secondary | ICD-10-CM | POA: Diagnosis not present

## 2023-01-17 ENCOUNTER — Encounter: Payer: Medicaid Other | Admitting: Family Medicine

## 2023-01-17 DIAGNOSIS — F0150 Vascular dementia without behavioral disturbance: Secondary | ICD-10-CM | POA: Diagnosis not present

## 2023-01-18 DIAGNOSIS — F0150 Vascular dementia without behavioral disturbance: Secondary | ICD-10-CM | POA: Diagnosis not present

## 2023-01-22 DIAGNOSIS — F0150 Vascular dementia without behavioral disturbance: Secondary | ICD-10-CM | POA: Diagnosis not present

## 2023-01-23 DIAGNOSIS — F0150 Vascular dementia without behavioral disturbance: Secondary | ICD-10-CM | POA: Diagnosis not present

## 2023-01-24 DIAGNOSIS — F0150 Vascular dementia without behavioral disturbance: Secondary | ICD-10-CM | POA: Diagnosis not present

## 2023-01-25 DIAGNOSIS — F0150 Vascular dementia without behavioral disturbance: Secondary | ICD-10-CM | POA: Diagnosis not present

## 2023-01-26 DIAGNOSIS — F0150 Vascular dementia without behavioral disturbance: Secondary | ICD-10-CM | POA: Diagnosis not present

## 2023-01-29 DIAGNOSIS — F0150 Vascular dementia without behavioral disturbance: Secondary | ICD-10-CM | POA: Diagnosis not present

## 2023-01-30 DIAGNOSIS — F0150 Vascular dementia without behavioral disturbance: Secondary | ICD-10-CM | POA: Diagnosis not present

## 2023-01-31 DIAGNOSIS — F0150 Vascular dementia without behavioral disturbance: Secondary | ICD-10-CM | POA: Diagnosis not present

## 2023-02-01 DIAGNOSIS — F0150 Vascular dementia without behavioral disturbance: Secondary | ICD-10-CM | POA: Diagnosis not present

## 2023-02-02 DIAGNOSIS — F0150 Vascular dementia without behavioral disturbance: Secondary | ICD-10-CM | POA: Diagnosis not present

## 2023-02-05 DIAGNOSIS — F0150 Vascular dementia without behavioral disturbance: Secondary | ICD-10-CM | POA: Diagnosis not present

## 2023-02-06 DIAGNOSIS — F0150 Vascular dementia without behavioral disturbance: Secondary | ICD-10-CM | POA: Diagnosis not present

## 2023-02-07 DIAGNOSIS — F0150 Vascular dementia without behavioral disturbance: Secondary | ICD-10-CM | POA: Diagnosis not present

## 2023-02-08 DIAGNOSIS — F0150 Vascular dementia without behavioral disturbance: Secondary | ICD-10-CM | POA: Diagnosis not present

## 2023-02-09 DIAGNOSIS — F0150 Vascular dementia without behavioral disturbance: Secondary | ICD-10-CM | POA: Diagnosis not present

## 2023-02-12 DIAGNOSIS — F0150 Vascular dementia without behavioral disturbance: Secondary | ICD-10-CM | POA: Diagnosis not present

## 2023-02-13 DIAGNOSIS — F0150 Vascular dementia without behavioral disturbance: Secondary | ICD-10-CM | POA: Diagnosis not present

## 2023-02-14 DIAGNOSIS — F0150 Vascular dementia without behavioral disturbance: Secondary | ICD-10-CM | POA: Diagnosis not present

## 2023-02-15 ENCOUNTER — Encounter: Payer: Medicaid Other | Admitting: Family Medicine

## 2023-02-15 DIAGNOSIS — F0150 Vascular dementia without behavioral disturbance: Secondary | ICD-10-CM | POA: Diagnosis not present

## 2023-02-18 DIAGNOSIS — R32 Unspecified urinary incontinence: Secondary | ICD-10-CM | POA: Diagnosis not present

## 2023-02-18 DIAGNOSIS — F0390 Unspecified dementia without behavioral disturbance: Secondary | ICD-10-CM | POA: Diagnosis not present

## 2023-02-19 DIAGNOSIS — F0150 Vascular dementia without behavioral disturbance: Secondary | ICD-10-CM | POA: Diagnosis not present

## 2023-02-20 DIAGNOSIS — F0150 Vascular dementia without behavioral disturbance: Secondary | ICD-10-CM | POA: Diagnosis not present

## 2023-02-21 DIAGNOSIS — F0150 Vascular dementia without behavioral disturbance: Secondary | ICD-10-CM | POA: Diagnosis not present

## 2023-02-22 DIAGNOSIS — F0150 Vascular dementia without behavioral disturbance: Secondary | ICD-10-CM | POA: Diagnosis not present

## 2023-02-23 DIAGNOSIS — F0150 Vascular dementia without behavioral disturbance: Secondary | ICD-10-CM | POA: Diagnosis not present

## 2023-02-26 ENCOUNTER — Encounter: Payer: Self-pay | Admitting: Family Medicine

## 2023-02-26 ENCOUNTER — Ambulatory Visit: Payer: Medicaid Other | Attending: Family Medicine | Admitting: Family Medicine

## 2023-02-26 ENCOUNTER — Other Ambulatory Visit: Payer: Self-pay

## 2023-02-26 ENCOUNTER — Telehealth: Payer: Self-pay | Admitting: Family Medicine

## 2023-02-26 VITALS — BP 106/71 | HR 71 | Ht 63.0 in | Wt 88.4 lb

## 2023-02-26 DIAGNOSIS — N390 Urinary tract infection, site not specified: Secondary | ICD-10-CM | POA: Diagnosis not present

## 2023-02-26 DIAGNOSIS — N3000 Acute cystitis without hematuria: Secondary | ICD-10-CM

## 2023-02-26 DIAGNOSIS — M25561 Pain in right knee: Secondary | ICD-10-CM

## 2023-02-26 DIAGNOSIS — G8929 Other chronic pain: Secondary | ICD-10-CM

## 2023-02-26 DIAGNOSIS — I10 Essential (primary) hypertension: Secondary | ICD-10-CM | POA: Diagnosis not present

## 2023-02-26 DIAGNOSIS — Z8673 Personal history of transient ischemic attack (TIA), and cerebral infarction without residual deficits: Secondary | ICD-10-CM | POA: Diagnosis not present

## 2023-02-26 DIAGNOSIS — Z79899 Other long term (current) drug therapy: Secondary | ICD-10-CM | POA: Diagnosis not present

## 2023-02-26 DIAGNOSIS — F0150 Vascular dementia without behavioral disturbance: Secondary | ICD-10-CM | POA: Diagnosis not present

## 2023-02-26 DIAGNOSIS — R32 Unspecified urinary incontinence: Secondary | ICD-10-CM | POA: Diagnosis not present

## 2023-02-26 DIAGNOSIS — R45851 Suicidal ideations: Secondary | ICD-10-CM | POA: Insufficient documentation

## 2023-02-26 DIAGNOSIS — E7800 Pure hypercholesterolemia, unspecified: Secondary | ICD-10-CM

## 2023-02-26 DIAGNOSIS — F03918 Unspecified dementia, unspecified severity, with other behavioral disturbance: Secondary | ICD-10-CM

## 2023-02-26 DIAGNOSIS — M25562 Pain in left knee: Secondary | ICD-10-CM | POA: Diagnosis not present

## 2023-02-26 LAB — POCT URINALYSIS DIP (CLINITEK)
Bilirubin, UA: NEGATIVE
Blood, UA: NEGATIVE
Glucose, UA: NEGATIVE mg/dL
Ketones, POC UA: NEGATIVE mg/dL
Nitrite, UA: POSITIVE — AB
POC PROTEIN,UA: 30 — AB
Spec Grav, UA: 1.025 (ref 1.010–1.025)
Urobilinogen, UA: 0.2 U/dL
pH, UA: 5.5 (ref 5.0–8.0)

## 2023-02-26 MED ORDER — CARVEDILOL 3.125 MG PO TABS
3.1250 mg | ORAL_TABLET | Freq: Two times a day (BID) | ORAL | 1 refills | Status: DC
  Filled 2023-02-26: qty 180, 90d supply, fill #0

## 2023-02-26 MED ORDER — MISC. DEVICES MISC: 0 refills | Status: AC

## 2023-02-26 MED ORDER — ATORVASTATIN CALCIUM 20 MG PO TABS
20.0000 mg | ORAL_TABLET | Freq: Every day | ORAL | 1 refills | Status: DC
  Filled 2023-02-26: qty 90, 90d supply, fill #0

## 2023-02-26 MED ORDER — LOSARTAN POTASSIUM 25 MG PO TABS
25.0000 mg | ORAL_TABLET | Freq: Every day | ORAL | 1 refills | Status: DC
  Filled 2023-02-26: qty 90, 90d supply, fill #0

## 2023-02-26 MED ORDER — AMLODIPINE BESYLATE 10 MG PO TABS
10.0000 mg | ORAL_TABLET | Freq: Every day | ORAL | 1 refills | Status: DC
  Filled 2023-02-26: qty 90, 90d supply, fill #0

## 2023-02-26 MED ORDER — DICLOFENAC SODIUM 1 % EX GEL
4.0000 g | Freq: Four times a day (QID) | CUTANEOUS | 1 refills | Status: AC
  Filled 2023-02-26: qty 100, fill #0

## 2023-02-26 NOTE — Telephone Encounter (Signed)
Copied from CRM [id number]. Topic: General - Inquiry >> Feb 26, 2023  4:21 PM Marlow Baars wrote: Reason for CRM: Dois Davenport with [facility] called in to make sure the provider received a fax for personal care services that she faxed over on 9/30. Please assist further

## 2023-02-26 NOTE — Patient Instructions (Signed)
 Chronic Knee Pain, Adult Knee pain that lasts longer than 3 months is called chronic knee pain. You may have pain in one or both knees. Symptoms of chronic knee pain may also include swelling and stiffness. Many conditions can cause chronic knee pain. The most common cause is wear and tear of your knee joint as you get older. Other possible causes include: A disease that causes inflammation of the knee, such as rheumatoid arthritis. This usually affects both knees. A condition called inflammatory arthritis, such as gout. An injury to the knee that causes arthritis. An injury to the knee that damages the ligaments. Ligaments are tissues that connect bones to each other. Runner's knee or pain behind the kneecap. Treatment for chronic knee pain depends on the cause. The main treatments for chronic knee pain are: Doing exercises to help your knee move better and get stronger, called physical therapy. Losing weight if you are overweight. This condition may also be treated with medicines, injections, a knee sleeve or brace, and by using crutches. You health care provider may also recommend rest, ice, pressure (compression), and elevation, also called RICE therapy. Follow these instructions at home: If you have a knee sleeve or brace that can be taken off:  Wear the knee sleeve or brace as told by your provider. Take it off only if your provider says that you can. Check the skin around it every day. Tell your provider if you see problems. Loosen the knee sleeve or brace if your toes tingle, are numb, or turn cold and blue. Keep the knee sleeve or brace clean and dry. Bathing If the knee sleeve or brace is not waterproof: Do not let it get wet. Cover it when you take a bath or shower. Use a cover that does not let any water in. Managing pain, stiffness, and swelling     If told, put heat on the area. Do this as often as told. Use the heat source that your provider recommends, such as a moist  heat pack or a heating pad. If you have a knee sleeve or brace that you can take off, remove it as told. Place a towel between your skin and the heat source. Leave the heat on for 20-30 minutes. If told, put ice on the area. If you have a knee sleeve or brace that you can take off, remove it as told. Put ice in a plastic bag. Place a towel between your skin and the bag. Leave the ice on for 20 minutes, 2-3 times a day. If your skin turns bright red, remove the ice or heat right away to prevent skin damage. The risk of damage is higher if you cannot feel pain, heat, or cold. Move your toes often to reduce stiffness and swelling. Raise the injured area above the level of your heart while you are sitting or lying down. Use a pillow to support your foot as needed. Activity Avoid activities where both feet leave the ground at the same time. Avoid running, jumping rope, and doing jumping jacks. Follow the exercise plan that your provider made for you. Your provider may suggest that you: Avoid activities that make knee pain worse. This may mean that you need to change your exercise routines, sports, or job duties. Wear shoes with cushioned soles. Avoid sports that require running and sudden changes in direction. Do physical therapy. Physical therapy helps your knee move better and get stronger. Exercise as told. Do exercises that increase balance and strength, such as  tai chi and yoga. Do not stand or walk on your injured knee until you're told it's okay. Use crutches as told. Return to normal activities when you're told. Ask what things are safe for you to do. General instructions Take your medicines only as told by your provider. If you are overweight, work with your provider and an expert in healthy eating called a dietitian to set goals to lose weight. Losing even a little weight can reduce knee pain. Being overweight can make your knee hurt more. Do not smoke, vape, or use products with  nicotine or tobacco in them. If you need help quitting, talk with your provider. Keep all follow-up visits. Your provider will monitor your pain and try other treatments if needed. Contact a health care provider if: You have knee pain that is not getting better or gets worse. You are not able to do your exercises due to knee pain. Get help right away if: Your knee swells and the swelling becomes worse. You cannot move your knee. You have severe knee pain. This information is not intended to replace advice given to you by your health care provider. Make sure you discuss any questions you have with your health care provider. Document Revised: 07/03/2022 Document Reviewed: 07/03/2022 Elsevier Patient Education  2024 [employer].

## 2023-02-26 NOTE — Progress Notes (Signed)
Subjective:  Patient ID: Erica Lopez, female    DOB: 1960/08/07  Age: 62 y.o. MRN: [id number]  CC: Medical Management of Chronic Issues (Document the use of PCS service/Bedside commode/Having pain all over body.)   HPI Erica Lopez is a 62 y.o. 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  Interval History: Discussed the use of AI scribe software for clinical note transcription with the patient, who gave verbal consent to proceed.  She presents initially with low blood pressure and tachycardia of 180. The patient's EKG performed in the office revealed a ventricular rate of 63 bpm, possible septal infarct, no change from previous EKG. The patient's daughter, who is the primary caregiver, reports that the patient has been experiencing urinary incontinence, leading to soaked linens and mattress. The patient's urine is described as 'super, super strong' despite attempts to increase water intake.  The patient also reports bilateral knee pain, which is suspected to be arthritis. The patient's granddaughter also reports that the patient has made remarks about not wanting to be here and drinking cleaner, suggesting possible suicidal ideation.  The patient states that she made this comment only on one occasion.       Past Medical History:  Diagnosis Date   Hypertension    Stroke (HCC) 2014    No past surgical history on file.  Family History  Problem Relation Age of Onset   Cancer Other    Hypertension Other    Stroke Other    Heart disease Other    Diabetes Other     Social History   Socioeconomic History   Marital status: Single    Spouse name: Not on file   Number of children: Not on file   Years of education: Not on file   Highest education level: Not on file  Occupational History   Not on file  Tobacco Use    Smoking status: Never   Smokeless tobacco: Never  Substance and Sexual Activity   Alcohol use: No   Drug use: No   Sexual activity: Not on file  Other Topics Concern   Not on file  Social History Narrative   Not on file   Social Determinants of Health   Financial Resource Strain: Not on file  Food Insecurity: Not on file  Transportation Needs: Not on file  Physical Activity: Not on file  Stress: Not on file  Social Connections: Not on file    Allergies  Allergen Reactions   Aspirin Anaphylaxis and Other (See Comments)    Bleeding event   Lisinopril Swelling   Other Hives, Swelling and Other (See Comments)    Blueberries, causing hives and swelling    Outpatient Medications Prior to Visit  Medication Sig Dispense Refill   Misc. Devices MISC Shower chair 1 each 0   triamcinolone (KENALOG) 0.025 % ointment Apply 1 application topically 2 (two) times daily. 30 g 0   amLODipine (NORVASC) 10 MG tablet Take 1 tablet (10 mg total) by mouth daily. 30 tablet 0   atorvastatin (LIPITOR) 20 MG tablet Take 1 tablet (20 mg total) by mouth daily. 30 tablet 0   carvedilol (COREG) 3.125 MG tablet Take 1 tablet (3.125 mg total) by mouth 2 (two) times daily with a meal. 60 tablet 0   losartan (COZAAR) 25 MG tablet Take 1 tablet (25 mg total) by mouth daily.  30 tablet 0   Misc. Devices MISC Bedside Commode 1 each 0   No facility-administered medications prior to visit.     ROS Review of Systems  Constitutional:  Negative for activity change and appetite change.  HENT:  Negative for sinus pressure and sore throat.   Respiratory:  Negative for chest tightness, shortness of breath and wheezing.   Cardiovascular:  Negative for chest pain and palpitations.  Gastrointestinal:  Negative for abdominal distention, abdominal pain and constipation.  Genitourinary: Negative.   Musculoskeletal: Negative.   Psychiatric/Behavioral:  Negative for behavioral problems and dysphoric mood.      Objective:  BP 106/71   Pulse 71   Ht 5\' 3"  (1.6 m)   Wt 88 lb 6.4 oz (40.1 kg)   SpO2 (!) 89%   BMI 15.66 kg/m      02/26/2023    3:56 PM 02/26/2023    3:02 PM 07/07/2022   11:30 PM  BP/Weight  Systolic BP 106 96 154  Diastolic BP 71 64 103  Wt. (Lbs)  88.4   BMI  15.66 kg/m2       Physical Exam Constitutional:      Appearance: She is well-developed.  Cardiovascular:     Rate and Rhythm: Normal rate.     Heart sounds: Normal heart sounds. No murmur heard. Pulmonary:     Effort: Pulmonary effort is normal.     Breath sounds: Normal breath sounds. No wheezing or rales.  Chest:     Chest wall: No tenderness.  Abdominal:     General: Bowel sounds are normal. There is no distension.     Palpations: Abdomen is soft. There is no mass.     Tenderness: There is no abdominal tenderness.  Musculoskeletal:        General: No swelling or tenderness. Normal range of motion.     Right lower leg: No edema.     Left lower leg: No edema.  Neurological:     Mental Status: She is alert and oriented to person, place, and time.  Psychiatric:        Mood and Affect: Mood normal.        Latest Ref Rng & Units 07/07/2022    5:33 PM 02/28/2022    3:25 PM 09/06/2021    3:25 PM  CMP  Glucose 70 - 99 mg/dL 161  096  045   BUN 8 - 23 mg/dL 17  22  20    Creatinine 0.44 - 1.00 mg/dL 4.09  8.11  9.14   Sodium 135 - 145 mmol/L 140  141  143   Potassium 3.5 - 5.1 mmol/L 4.2  4.3  4.4   Chloride 98 - 111 mmol/L 106  105  103   CO2 22 - 32 mmol/L 25  19  21    Calcium 8.9 - 10.3 mg/dL 8.9  9.8  78.2   Total Protein 6.5 - 8.1 g/dL 7.3     Total Bilirubin 0.3 - 1.2 mg/dL 0.5     Alkaline Phos 38 - 126 U/L 61     AST 15 - 41 U/L 19     ALT 0 - 44 U/L 19       Lipid Panel     Component Value Date/Time   CHOL 224 (H) 05/19/2021 1158   TRIG 111 05/19/2021 1158   HDL 79 05/19/2021 1158   LDLCALC 126 (H) 05/19/2021 1158    CBC    Component Value Date/Time   WBC 7.2 07/07/2022  1733   RBC 5.07 07/07/2022 1733   HGB 11.3 (L) 07/07/2022 1733   HGB 12.6 08/17/2021 1428   HCT 37.1 07/07/2022 1733   HCT 42.9 08/17/2021 1428   PLT 184 07/07/2022 1733   PLT 232 08/17/2021 1428   MCV 73.2 (L) 07/07/2022 1733   MCV 73 (L) 08/17/2021 1428   MCH 22.3 (L) 07/07/2022 1733   MCHC 30.5 07/07/2022 1733   RDW 17.2 (H) 07/07/2022 1733   RDW 18.0 (H) 08/17/2021 1428   LYMPHSABS 0.8 07/07/2022 1733   LYMPHSABS 1.2 08/17/2021 1428   MONOABS 0.4 07/07/2022 1733   EOSABS 0.0 07/07/2022 1733   EOSABS 0.0 08/17/2021 1428   BASOSABS 0.0 07/07/2022 1733   BASOSABS 0.0 08/17/2021 1428    Lab Results  Component Value Date   HGBA1C 5.7 (H) 08/17/2021    Assessment & Plan:      Urinary Tract Infection Strong smelling urine, no dysuria. Urinalysis positive for UTI. -Send urine culture to lab. -Treat based on culture and sensitivity results.  Hypertension Initial low blood pressure at visit, improved upon recheck. Currently on Amlodipine, Losartan, and Carvedilol. -Continue current medications.  Lateral knee osteoarthritis Complaints of leg pain, possibly arthritis. No swelling noted. -Prescribe Voltaren gel, a topical anti-inflammatory, for pain as needed.  Incontinence Reports of urinary incontinence leading to soaked linens and mattress. -Order bedside commode.  Dementia with behavioral disturbances Reports of patient expressing suicidal ideation to family member. -Refer to psychiatrist for evaluation.  Preventive Health -Schedule Pap smear, mammogram in 6 weeks.          Meds ordered this encounter  Medications   Misc. Devices MISC    Sig: Bedside Commode    Dispense:  1 each    Refill:  0   amLODipine (NORVASC) 10 MG tablet    Sig: Take 1 tablet (10 mg total) by mouth daily.    Dispense:  90 tablet    Refill:  1   atorvastatin (LIPITOR) 20 MG tablet    Sig: Take 1 tablet (20 mg total) by mouth daily.    Dispense:  90 tablet    Refill:  1    carvedilol (COREG) 3.125 MG tablet    Sig: Take 1 tablet (3.125 mg total) by mouth 2 (two) times daily with a meal.    Dispense:  180 tablet    Refill:  1   losartan (COZAAR) 25 MG tablet    Sig: Take 1 tablet (25 mg total) by mouth daily.    Dispense:  90 tablet    Refill:  1   diclofenac Sodium (VOLTAREN) 1 % GEL    Sig: Apply 4 g topically 4 (four) times daily.    Dispense:  100 g    Refill:  1    Follow-up: Return in about 6 weeks (around 04/09/2023) for CPE/ Preventive Health Exam.       Hoy Register, MD, FAAFP. [facility] [place], [place] [phone]   02/26/2023, 4:11 PM

## 2023-02-27 ENCOUNTER — Other Ambulatory Visit: Payer: Self-pay

## 2023-02-27 DIAGNOSIS — F0150 Vascular dementia without behavioral disturbance: Secondary | ICD-10-CM | POA: Diagnosis not present

## 2023-02-27 NOTE — Telephone Encounter (Signed)
Form has been completed and form was faxed today to number listed on form.

## 2023-02-28 DIAGNOSIS — F0150 Vascular dementia without behavioral disturbance: Secondary | ICD-10-CM | POA: Diagnosis not present

## 2023-03-01 ENCOUNTER — Other Ambulatory Visit: Payer: Self-pay | Admitting: Family Medicine

## 2023-03-01 DIAGNOSIS — F0150 Vascular dementia without behavioral disturbance: Secondary | ICD-10-CM | POA: Diagnosis not present

## 2023-03-01 LAB — URINE CULTURE

## 2023-03-01 MED ORDER — CEPHALEXIN 500 MG PO CAPS: 500.0000 mg | ORAL_CAPSULE | Freq: Two times a day (BID) | ORAL | 0 refills | Status: DC

## 2023-03-02 ENCOUNTER — Telehealth: Payer: Self-pay | Admitting: Family Medicine

## 2023-03-02 DIAGNOSIS — F0150 Vascular dementia without behavioral disturbance: Secondary | ICD-10-CM | POA: Diagnosis not present

## 2023-03-02 NOTE — Telephone Encounter (Signed)
Dois Davenport calling from [facility] is calling to request an order for Personal Care Services. Request was faxed on 02/19/23. Please advise CB- [phone] Fax-[phone]

## 2023-03-05 DIAGNOSIS — F0150 Vascular dementia without behavioral disturbance: Secondary | ICD-10-CM | POA: Diagnosis not present

## 2023-03-05 NOTE — Telephone Encounter (Signed)
PCS form has been faxed.

## 2023-03-06 DIAGNOSIS — F0150 Vascular dementia without behavioral disturbance: Secondary | ICD-10-CM | POA: Diagnosis not present

## 2023-03-07 DIAGNOSIS — F0150 Vascular dementia without behavioral disturbance: Secondary | ICD-10-CM | POA: Diagnosis not present

## 2023-03-08 DIAGNOSIS — F0150 Vascular dementia without behavioral disturbance: Secondary | ICD-10-CM | POA: Diagnosis not present

## 2023-03-09 DIAGNOSIS — F0150 Vascular dementia without behavioral disturbance: Secondary | ICD-10-CM | POA: Diagnosis not present

## 2023-03-12 DIAGNOSIS — F0150 Vascular dementia without behavioral disturbance: Secondary | ICD-10-CM | POA: Diagnosis not present

## 2023-03-13 DIAGNOSIS — F0150 Vascular dementia without behavioral disturbance: Secondary | ICD-10-CM | POA: Diagnosis not present

## 2023-03-14 DIAGNOSIS — F0150 Vascular dementia without behavioral disturbance: Secondary | ICD-10-CM | POA: Diagnosis not present

## 2023-03-15 DIAGNOSIS — F0150 Vascular dementia without behavioral disturbance: Secondary | ICD-10-CM | POA: Diagnosis not present

## 2023-03-16 DIAGNOSIS — F0150 Vascular dementia without behavioral disturbance: Secondary | ICD-10-CM | POA: Diagnosis not present

## 2023-03-20 DIAGNOSIS — F0150 Vascular dementia without behavioral disturbance: Secondary | ICD-10-CM | POA: Diagnosis not present

## 2023-03-21 DIAGNOSIS — F0150 Vascular dementia without behavioral disturbance: Secondary | ICD-10-CM | POA: Diagnosis not present

## 2023-03-22 DIAGNOSIS — F0150 Vascular dementia without behavioral disturbance: Secondary | ICD-10-CM | POA: Diagnosis not present

## 2023-03-23 DIAGNOSIS — F0150 Vascular dementia without behavioral disturbance: Secondary | ICD-10-CM | POA: Diagnosis not present

## 2023-03-26 DIAGNOSIS — F0150 Vascular dementia without behavioral disturbance: Secondary | ICD-10-CM | POA: Diagnosis not present

## 2023-03-27 ENCOUNTER — Telehealth: Payer: Self-pay

## 2023-03-27 DIAGNOSIS — F0150 Vascular dementia without behavioral disturbance: Secondary | ICD-10-CM | POA: Diagnosis not present

## 2023-03-27 NOTE — Telephone Encounter (Signed)
Ada, with [facility], has called, returning Alycia's phone call in regards to a bedside commode for the patient. Please contact Ada back @ # [phone].   Ada also wanted to let PCP know that [facility] has everything they need except for a narrative, and the narrative needs to state that the patient can not ambulate to the bathroom, that she is confined to one room.

## 2023-03-27 NOTE — Telephone Encounter (Signed)
Copied from CRM [id number]. Topic: General - Other >> Mar 27, 2023  9:09 AM Everette C wrote: Reason for CRM: The patient's daughter has called to follow up on previous discussions related to a bed side commode for the patient   The patient would like to know which supply company will be used to provide the equipment   Please contact further when available

## 2023-03-27 NOTE — Telephone Encounter (Signed)
[employer] needs recent note updated with this information so they can ship her bedside commode

## 2023-03-28 DIAGNOSIS — F0150 Vascular dementia without behavioral disturbance: Secondary | ICD-10-CM | POA: Diagnosis not present

## 2023-03-28 NOTE — Telephone Encounter (Signed)
The information they are requesting is incorrect with regards to this patient.  She is not confined to one room and is able to ambulate to the bathroom. Unfortunately I am unable to insert that statement into my note.  She however does have urinary incontinence and wets her bed.

## 2023-03-29 ENCOUNTER — Telehealth: Payer: Self-pay | Admitting: Family Medicine

## 2023-03-29 DIAGNOSIS — F0150 Vascular dementia without behavioral disturbance: Secondary | ICD-10-CM | POA: Diagnosis not present

## 2023-03-29 NOTE — Telephone Encounter (Signed)
Amber Case Mgr from [facility] called to see if a shower chair could be ordered from patient. Please f/u with [facility]

## 2023-03-29 NOTE — Telephone Encounter (Signed)
VM was left informing daughter that provider states that this does not apply to the patient and insurance will not pay without this information being added to her note.

## 2023-03-30 MED ORDER — MISC. DEVICES MISC: 0 refills | Status: AC

## 2023-03-30 NOTE — Telephone Encounter (Signed)
Done

## 2023-03-30 NOTE — Telephone Encounter (Signed)
Call placed to patient unable to reach message.  VM full.  If patient returns call. Please let her know that PCP has written order for Shower chair we just need to know where she like [place] to sent it to.

## 2023-04-02 DIAGNOSIS — F0150 Vascular dementia without behavioral disturbance: Secondary | ICD-10-CM | POA: Diagnosis not present

## 2023-04-02 NOTE — Telephone Encounter (Signed)
Call placed to patient unable to reach message left on  VM   If patient returns call. Please let her know that PCP has written order for Shower chair we just need to know where she like [place] to sent it to.

## 2023-04-03 DIAGNOSIS — F0150 Vascular dementia without behavioral disturbance: Secondary | ICD-10-CM | POA: Diagnosis not present

## 2023-04-03 DIAGNOSIS — Z7409 Other reduced mobility: Secondary | ICD-10-CM | POA: Diagnosis not present

## 2023-04-03 DIAGNOSIS — R32 Unspecified urinary incontinence: Secondary | ICD-10-CM | POA: Diagnosis not present

## 2023-04-04 DIAGNOSIS — F0150 Vascular dementia without behavioral disturbance: Secondary | ICD-10-CM | POA: Diagnosis not present

## 2023-04-05 DIAGNOSIS — F0150 Vascular dementia without behavioral disturbance: Secondary | ICD-10-CM | POA: Diagnosis not present

## 2023-04-06 DIAGNOSIS — F0150 Vascular dementia without behavioral disturbance: Secondary | ICD-10-CM | POA: Diagnosis not present

## 2023-04-09 DIAGNOSIS — F0150 Vascular dementia without behavioral disturbance: Secondary | ICD-10-CM | POA: Diagnosis not present

## 2023-04-10 ENCOUNTER — Encounter: Payer: Self-pay | Admitting: Family Medicine

## 2023-04-10 ENCOUNTER — Ambulatory Visit: Payer: Medicaid Other | Attending: Family Medicine | Admitting: Family Medicine

## 2023-04-10 ENCOUNTER — Other Ambulatory Visit (HOSPITAL_COMMUNITY)
Admission: RE | Admit: 2023-04-10 | Discharge: 2023-04-10 | Disposition: A | Discharge status: Home / Self Care | Payer: Medicaid Other | Source: Ambulatory Visit | Attending: Family Medicine | Admitting: Family Medicine

## 2023-04-10 VITALS — BP 120/73 | HR 69 | Ht 63.0 in | Wt 88.4 lb

## 2023-04-10 DIAGNOSIS — F0150 Vascular dementia without behavioral disturbance: Secondary | ICD-10-CM | POA: Diagnosis not present

## 2023-04-10 DIAGNOSIS — Z124 Encounter for screening for malignant neoplasm of cervix: Secondary | ICD-10-CM | POA: Insufficient documentation

## 2023-04-10 DIAGNOSIS — Z1231 Encounter for screening mammogram for malignant neoplasm of breast: Secondary | ICD-10-CM | POA: Diagnosis not present

## 2023-04-10 DIAGNOSIS — Z0001 Encounter for general adult medical examination with abnormal findings: Secondary | ICD-10-CM

## 2023-04-10 DIAGNOSIS — N898 Other specified noninflammatory disorders of vagina: Secondary | ICD-10-CM

## 2023-04-10 NOTE — Addendum Note (Signed)
Addended by: Hoy Register on: 04/10/2023 03:43 PM   Modules accepted: Orders

## 2023-04-10 NOTE — Progress Notes (Addendum)
Subjective:  Patient ID: Erica Lopez, female    DOB: [birth year]  Age: 62 y.o. MRN: [id number]  CC: Annual Exam and Gynecologic Exam   HPI Lanet Vandehey is a 62 y.o. 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  She presents for a complete physical exam and is due for breast cancer and cervical cancer screening.  She declines shingles vaccine.   She is status post hysterectomy per daughter due to ' tumors in her uterus' right after her daughter was born several years ago.   Past Medical History:  Diagnosis Date   Hypertension    Stroke (HCC) 2014    No past surgical history on file.  Family History  Problem Relation Age of Onset   Cancer Other    Hypertension Other    Stroke Other    Heart disease Other    Diabetes Other     Social History   Socioeconomic History   Marital status: Single    Spouse name: Not on file   Number of children: Not on file   Years of education: Not on file   Highest education level: Not on file  Occupational History   Not on file  Tobacco Use   Smoking status: Never   Smokeless tobacco: Never  Substance and Sexual Activity   Alcohol use: No   Drug use: No   Sexual activity: Not on file  Other Topics Concern   Not on file  Social History Narrative   Not on file   Social Determinants of Health   Financial Resource Strain: Low Risk  (02/26/2023)   Overall Financial Resource Strain (CARDIA)    Difficulty of Paying Living Expenses: Not very hard  Food Insecurity: No Food Insecurity (02/26/2023)   Hunger Vital Sign    Worried About Running Out of Food in the Last Year: Never true    Ran Out of Food in the Last Year: Never true  Transportation Needs: No Transportation Needs (02/26/2023)   PRAPARE - Administrator, Civil Service (Medical): No    Lack of  Transportation (Non-Medical): No  Physical Activity: Inactive (02/26/2023)   Exercise Vital Sign    Days of Exercise per Week: 0 days    Minutes of Exercise per Session: 0 min  Stress: No Stress Concern Present (02/26/2023)   [employer] of Occupational Health - Occupational Stress Questionnaire    Feeling of Stress : Only a little  Social Connections: Moderately Isolated (02/26/2023)   Social Connection and Isolation Panel [NHANES]    Frequency of Communication with Friends and Family: Once a week    Frequency of Social Gatherings with Friends and Family: Once a week    Attends Religious Services: 1 to 4 times per year    Active Member of [employer] or Organizations: Yes    Attends Banker Meetings: Never    Marital Status: Never married    Allergies  Allergen Reactions   Aspirin Anaphylaxis and Other (See Comments)    Bleeding event   Lisinopril Swelling   Other Hives, Swelling and Other (See Comments)    Blueberries, causing hives and swelling    Outpatient Medications Prior to Visit  Medication Sig Dispense Refill   amLODipine (NORVASC) 10 MG tablet Take 1 tablet (10 mg total) by mouth daily. 90 tablet 1   atorvastatin (LIPITOR) 20 MG  tablet Take 1 tablet (20 mg total) by mouth daily. 90 tablet 1   carvedilol (COREG) 3.125 MG tablet Take 1 tablet (3.125 mg total) by mouth 2 (two) times daily with a meal. 180 tablet 1   cephALEXin (KEFLEX) 500 MG capsule Take 1 capsule (500 mg total) by mouth 2 (two) times daily. 6 capsule 0   diclofenac Sodium (VOLTAREN) 1 % GEL Apply 4 g topically 4 (four) times daily. 100 g 1   losartan (COZAAR) 25 MG tablet Take 1 tablet (25 mg total) by mouth daily. 90 tablet 1   Misc. Devices MISC Bedside Commode 1 each 0   Misc. Devices MISC Shower chair.  Diagnosis - Dementia 1 each 0   triamcinolone (KENALOG) 0.025 % ointment Apply 1 application topically 2 (two) times daily. 30 g 0   No facility-administered medications prior to visit.      ROS Review of Systems  Constitutional:  Negative for activity change and appetite change.  HENT:  Negative for sinus pressure and sore throat.   Respiratory:  Negative for chest tightness, shortness of breath and wheezing.   Cardiovascular:  Negative for chest pain and palpitations.  Gastrointestinal:  Negative for abdominal distention, abdominal pain and constipation.  Genitourinary: Negative.   Musculoskeletal: Negative.   Psychiatric/Behavioral:  Negative for behavioral problems and dysphoric mood.     Objective:  There were no vitals taken for this visit.     02/26/2023    3:56 PM 02/26/2023    3:02 PM 07/07/2022   11:30 PM  BP/Weight  Systolic BP 106 96 154  Diastolic BP 71 64 103  Wt. (Lbs)  88.4   BMI  15.66 kg/m2       Physical Exam Exam conducted with a chaperone present.  Constitutional:      General: She is not in acute distress.    Appearance: She is well-developed. She is not diaphoretic.  HENT:     Head: Normocephalic.     Right Ear: External ear normal.     Left Ear: External ear normal.     Nose: Nose normal.  Eyes:     Conjunctiva/sclera: Conjunctivae normal.     Pupils: Pupils are equal, round, and reactive to light.  Neck:     Vascular: No JVD.  Cardiovascular:     Rate and Rhythm: Normal rate and regular rhythm.     Heart sounds: Normal heart sounds. No murmur heard.    No gallop.  Pulmonary:     Effort: Pulmonary effort is normal. No respiratory distress.     Breath sounds: Normal breath sounds. No wheezing or rales.  Chest:     Chest wall: No tenderness.  Breasts:    Right: Normal. No mass, nipple discharge or tenderness.     Left: Normal. No mass, nipple discharge or tenderness.  Abdominal:     General: Bowel sounds are normal. There is no distension.     Palpations: Abdomen is soft. There is no mass.     Tenderness: There is no abdominal tenderness.     Hernia: There is no hernia in the left inguinal area or right inguinal area.   Genitourinary:    General: Normal vulva.     Pubic Area: No rash.      Labia:        Right: No rash.        Left: No rash.      Vagina: Lesions (soft palpable lesions in vagina walls) present.  Cervix: Normal.     Uterus: Absent.      Adnexa: Right adnexa normal and left adnexa normal.       Right: No tenderness.         Left: No tenderness.    Musculoskeletal:        General: No tenderness. Normal range of motion.     Cervical back: Normal range of motion. No tenderness.  Lymphadenopathy:     Upper Body:     Right upper body: No supraclavicular or axillary adenopathy.     Left upper body: No supraclavicular or axillary adenopathy.  Skin:    General: Skin is warm and dry.  Neurological:     Mental Status: She is alert and oriented to person, place, and time.     Deep Tendon Reflexes: Reflexes are normal and symmetric.        Latest Ref Rng & Units 07/07/2022    5:33 PM 02/28/2022    3:25 PM 09/06/2021    3:25 PM  CMP  Glucose 70 - 99 mg/dL 409  811  914   BUN 8 - 23 mg/dL 17  22  20    Creatinine 0.44 - 1.00 mg/dL 7.82  9.56  2.13   Sodium 135 - 145 mmol/L 140  141  143   Potassium 3.5 - 5.1 mmol/L 4.2  4.3  4.4   Chloride 98 - 111 mmol/L 106  105  103   CO2 22 - 32 mmol/L 25  19  21    Calcium 8.9 - 10.3 mg/dL 8.9  9.8  08.6   Total Protein 6.5 - 8.1 g/dL 7.3     Total Bilirubin 0.3 - 1.2 mg/dL 0.5     Alkaline Phos 38 - 126 U/L 61     AST 15 - 41 U/L 19     ALT 0 - 44 U/L 19       Lipid Panel     Component Value Date/Time   CHOL 224 (H) 05/19/2021 1158   TRIG 111 05/19/2021 1158   HDL 79 05/19/2021 1158   LDLCALC 126 (H) 05/19/2021 1158    CBC    Component Value Date/Time   WBC 7.2 07/07/2022 1733   RBC 5.07 07/07/2022 1733   HGB 11.3 (L) 07/07/2022 1733   HGB 12.6 08/17/2021 1428   HCT 37.1 07/07/2022 1733   HCT 42.9 08/17/2021 1428   PLT 184 07/07/2022 1733   PLT 232 08/17/2021 1428   MCV 73.2 (L) 07/07/2022 1733   MCV 73 (L) 08/17/2021 1428    MCH 22.3 (L) 07/07/2022 1733   MCHC 30.5 07/07/2022 1733   RDW 17.2 (H) 07/07/2022 1733   RDW 18.0 (H) 08/17/2021 1428   LYMPHSABS 0.8 07/07/2022 1733   LYMPHSABS 1.2 08/17/2021 1428   MONOABS 0.4 07/07/2022 1733   EOSABS 0.0 07/07/2022 1733   EOSABS 0.0 08/17/2021 1428   BASOSABS 0.0 07/07/2022 1733   BASOSABS 0.0 08/17/2021 1428    Lab Results  Component Value Date   HGBA1C 5.7 (H) 08/17/2021    Assessment & Plan:  1. Annual physical exam Counseled on 150 minutes of exercise per week, healthy eating (including decreased daily intake of saturated fats, cholesterol, added sugars, sodium), STI prevention, routine healthcare maintenance.   2. Encounter for screening mammogram for malignant neoplasm of breast - MM 3D SCREENING MAMMOGRAM BILATERAL BREAST; Future  3. Screening for cervical cancer - Cytology - PAP  No orders of the defined types were placed in this encounter.   Follow-up: No follow-ups on file.       Hoy Register, MD, FAAFP. [facility] [place], [place] [phone]   04/10/2023, 3:03 PM

## 2023-04-10 NOTE — Patient Instructions (Signed)
Health Maintenance After Age 63 After age 63, you are at a higher risk for certain long-term diseases and infections as well as injuries from falls. Falls are a major cause of broken bones and head injuries in people who are older than age 63. Getting regular preventive care can help to keep you healthy and well. Preventive care includes getting regular testing and making lifestyle changes as recommended by your health care provider. Talk with your health care provider about: Which screenings and tests you should have. A screening is a test that checks for a disease when you have no symptoms. A diet and exercise plan that is right for you. What should I know about screenings and tests to prevent falls? Screening and testing are the best ways to find a health problem early. Early diagnosis and treatment give you the best chance of managing medical conditions that are common after age 63. Certain conditions and lifestyle choices may make you more likely to have a fall. Your health care provider may recommend: Regular vision checks. Poor vision and conditions such as cataracts can make you more likely to have a fall. If you wear glasses, make sure to get your prescription updated if your vision changes. Medicine review. Work with your health care provider to regularly review all of the medicines you are taking, including over-the-counter medicines. Ask your health care provider about any side effects that may make you more likely to have a fall. Tell your health care provider if any medicines that you take make you feel dizzy or sleepy. Strength and balance checks. Your health care provider may recommend certain tests to check your strength and balance while standing, walking, or changing positions. Foot health exam. Foot pain and numbness, as well as not wearing proper footwear, can make you more likely to have a fall. Screenings, including: Osteoporosis screening. Osteoporosis is a condition that causes  the bones to get weaker and break more easily. Blood pressure screening. Blood pressure changes and medicines to control blood pressure can make you feel dizzy. Depression screening. You may be more likely to have a fall if you have a fear of falling, feel depressed, or feel unable to do activities that you used to do. Alcohol use screening. Using too much alcohol can affect your balance and may make you more likely to have a fall. Follow these instructions at home: Lifestyle Do not drink alcohol if: Your health care provider tells you not to drink. If you drink alcohol: Limit how much you have to: 0-1 drink a day for women. 0-2 drinks a day for men. Know how much alcohol is in your drink. In the [place] one drink equals one 12 oz bottle of beer (355 mL), one 5 oz glass of wine (148 mL), or one 1 oz glass of hard liquor (44 mL). Do not use any products that contain nicotine or tobacco. These products include cigarettes, chewing tobacco, and vaping devices, such as e-cigarettes. If you need help quitting, ask your health care provider. Activity  Follow a regular exercise program to stay fit. This will help you maintain your balance. Ask your health care provider what types of exercise are appropriate for you. If you need a cane or walker, use it as recommended by your health care provider. Wear supportive shoes that have nonskid soles. Safety  Remove any tripping hazards, such as rugs, cords, and clutter. Install safety equipment such as grab bars in bathrooms and safety rails on stairs. Keep rooms and walkways   well-lit. General instructions Talk with your health care provider about your risks for falling. Tell your health care provider if: You fall. Be sure to tell your health care provider about all falls, even ones that seem minor. You feel dizzy, tiredness (fatigue), or off-balance. Take over-the-counter and prescription medicines only as told by your health care provider. These include  supplements. Eat a healthy diet and maintain a healthy weight. A healthy diet includes low-fat dairy products, low-fat (lean) meats, and fiber from whole grains, beans, and lots of fruits and vegetables. Stay current with your vaccines. Schedule regular health, dental, and eye exams. Summary Having a healthy lifestyle and getting preventive care can help to protect your health and wellness after age 63. Screening and testing are the best way to find a health problem early and help you avoid having a fall. Early diagnosis and treatment give you the best chance for managing medical conditions that are more common for people who are older than age 63. Falls are a major cause of broken bones and head injuries in people who are older than age 63. Take precautions to prevent a fall at home. Work with your health care provider to learn what changes you can make to improve your health and wellness and to prevent falls. This information is not intended to replace advice given to you by your health care provider. Make sure you discuss any questions you have with your health care provider. Document Revised: 09/27/2020 Document Reviewed: 09/27/2020 Elsevier Patient Education  2024 Elsevier Inc.  

## 2023-04-11 DIAGNOSIS — F0150 Vascular dementia without behavioral disturbance: Secondary | ICD-10-CM | POA: Diagnosis not present

## 2023-04-12 DIAGNOSIS — F0150 Vascular dementia without behavioral disturbance: Secondary | ICD-10-CM | POA: Diagnosis not present

## 2023-04-12 LAB — CYTOLOGY - PAP
Comment: NEGATIVE
Diagnosis: NEGATIVE
High risk HPV: NEGATIVE

## 2023-04-13 DIAGNOSIS — F0150 Vascular dementia without behavioral disturbance: Secondary | ICD-10-CM | POA: Diagnosis not present

## 2023-04-16 DIAGNOSIS — F0150 Vascular dementia without behavioral disturbance: Secondary | ICD-10-CM | POA: Diagnosis not present

## 2023-04-17 DIAGNOSIS — F0150 Vascular dementia without behavioral disturbance: Secondary | ICD-10-CM | POA: Diagnosis not present

## 2023-04-18 DIAGNOSIS — F0150 Vascular dementia without behavioral disturbance: Secondary | ICD-10-CM | POA: Diagnosis not present

## 2023-04-23 DIAGNOSIS — F0150 Vascular dementia without behavioral disturbance: Secondary | ICD-10-CM | POA: Diagnosis not present

## 2023-04-24 DIAGNOSIS — F0150 Vascular dementia without behavioral disturbance: Secondary | ICD-10-CM | POA: Diagnosis not present

## 2023-04-25 DIAGNOSIS — F0150 Vascular dementia without behavioral disturbance: Secondary | ICD-10-CM | POA: Diagnosis not present

## 2023-04-26 DIAGNOSIS — F0150 Vascular dementia without behavioral disturbance: Secondary | ICD-10-CM | POA: Diagnosis not present

## 2023-04-27 DIAGNOSIS — F0150 Vascular dementia without behavioral disturbance: Secondary | ICD-10-CM | POA: Diagnosis not present

## 2023-04-30 DIAGNOSIS — F0150 Vascular dementia without behavioral disturbance: Secondary | ICD-10-CM | POA: Diagnosis not present

## 2023-05-01 DIAGNOSIS — F0150 Vascular dementia without behavioral disturbance: Secondary | ICD-10-CM | POA: Diagnosis not present

## 2023-05-02 DIAGNOSIS — F0150 Vascular dementia without behavioral disturbance: Secondary | ICD-10-CM | POA: Diagnosis not present

## 2023-05-03 DIAGNOSIS — F0150 Vascular dementia without behavioral disturbance: Secondary | ICD-10-CM | POA: Diagnosis not present

## 2023-05-04 DIAGNOSIS — F0150 Vascular dementia without behavioral disturbance: Secondary | ICD-10-CM | POA: Diagnosis not present

## 2023-05-07 DIAGNOSIS — F0150 Vascular dementia without behavioral disturbance: Secondary | ICD-10-CM | POA: Diagnosis not present

## 2023-05-08 DIAGNOSIS — F0150 Vascular dementia without behavioral disturbance: Secondary | ICD-10-CM | POA: Diagnosis not present

## 2023-05-09 DIAGNOSIS — F0150 Vascular dementia without behavioral disturbance: Secondary | ICD-10-CM | POA: Diagnosis not present

## 2023-05-10 DIAGNOSIS — F0150 Vascular dementia without behavioral disturbance: Secondary | ICD-10-CM | POA: Diagnosis not present

## 2023-05-11 DIAGNOSIS — F0150 Vascular dementia without behavioral disturbance: Secondary | ICD-10-CM | POA: Diagnosis not present

## 2023-05-14 ENCOUNTER — Ambulatory Visit
Admission: RE | Admit: 2023-05-14 | Discharge: 2023-05-14 | Disposition: A | Discharge status: Home / Self Care | Payer: Medicaid Other | Source: Ambulatory Visit | Attending: Family Medicine | Admitting: Family Medicine

## 2023-05-14 DIAGNOSIS — Z1231 Encounter for screening mammogram for malignant neoplasm of breast: Secondary | ICD-10-CM | POA: Diagnosis not present

## 2023-05-14 DIAGNOSIS — F0150 Vascular dementia without behavioral disturbance: Secondary | ICD-10-CM | POA: Diagnosis not present

## 2023-05-15 DIAGNOSIS — F0150 Vascular dementia without behavioral disturbance: Secondary | ICD-10-CM | POA: Diagnosis not present

## 2023-05-17 DIAGNOSIS — F0150 Vascular dementia without behavioral disturbance: Secondary | ICD-10-CM | POA: Diagnosis not present

## 2023-05-18 DIAGNOSIS — F0150 Vascular dementia without behavioral disturbance: Secondary | ICD-10-CM | POA: Diagnosis not present

## 2023-05-21 DIAGNOSIS — F0150 Vascular dementia without behavioral disturbance: Secondary | ICD-10-CM | POA: Diagnosis not present

## 2023-05-22 DIAGNOSIS — F0150 Vascular dementia without behavioral disturbance: Secondary | ICD-10-CM | POA: Diagnosis not present

## 2023-05-23 DIAGNOSIS — F0150 Vascular dementia without behavioral disturbance: Secondary | ICD-10-CM | POA: Diagnosis not present

## 2023-05-24 DIAGNOSIS — F0150 Vascular dementia without behavioral disturbance: Secondary | ICD-10-CM | POA: Diagnosis not present

## 2023-05-25 DIAGNOSIS — F0150 Vascular dementia without behavioral disturbance: Secondary | ICD-10-CM | POA: Diagnosis not present

## 2023-05-28 DIAGNOSIS — F0150 Vascular dementia without behavioral disturbance: Secondary | ICD-10-CM | POA: Diagnosis not present

## 2023-05-29 DIAGNOSIS — F0150 Vascular dementia without behavioral disturbance: Secondary | ICD-10-CM | POA: Diagnosis not present

## 2023-05-30 DIAGNOSIS — F0150 Vascular dementia without behavioral disturbance: Secondary | ICD-10-CM | POA: Diagnosis not present

## 2023-05-31 DIAGNOSIS — F0150 Vascular dementia without behavioral disturbance: Secondary | ICD-10-CM | POA: Diagnosis not present

## 2023-06-01 DIAGNOSIS — F0150 Vascular dementia without behavioral disturbance: Secondary | ICD-10-CM | POA: Diagnosis not present

## 2023-06-04 DIAGNOSIS — F0150 Vascular dementia without behavioral disturbance: Secondary | ICD-10-CM | POA: Diagnosis not present

## 2023-06-05 DIAGNOSIS — F0150 Vascular dementia without behavioral disturbance: Secondary | ICD-10-CM | POA: Diagnosis not present

## 2023-06-06 DIAGNOSIS — F0150 Vascular dementia without behavioral disturbance: Secondary | ICD-10-CM | POA: Diagnosis not present

## 2023-06-07 DIAGNOSIS — F0150 Vascular dementia without behavioral disturbance: Secondary | ICD-10-CM | POA: Diagnosis not present

## 2023-06-08 DIAGNOSIS — F0150 Vascular dementia without behavioral disturbance: Secondary | ICD-10-CM | POA: Diagnosis not present

## 2023-06-11 DIAGNOSIS — F0150 Vascular dementia without behavioral disturbance: Secondary | ICD-10-CM | POA: Diagnosis not present

## 2023-06-12 DIAGNOSIS — F0150 Vascular dementia without behavioral disturbance: Secondary | ICD-10-CM | POA: Diagnosis not present

## 2023-06-13 DIAGNOSIS — F0150 Vascular dementia without behavioral disturbance: Secondary | ICD-10-CM | POA: Diagnosis not present

## 2023-06-14 DIAGNOSIS — F0150 Vascular dementia without behavioral disturbance: Secondary | ICD-10-CM | POA: Diagnosis not present

## 2023-06-15 DIAGNOSIS — F0150 Vascular dementia without behavioral disturbance: Secondary | ICD-10-CM | POA: Diagnosis not present

## 2023-06-18 DIAGNOSIS — F0150 Vascular dementia without behavioral disturbance: Secondary | ICD-10-CM | POA: Diagnosis not present

## 2023-06-19 DIAGNOSIS — F0150 Vascular dementia without behavioral disturbance: Secondary | ICD-10-CM | POA: Diagnosis not present

## 2023-06-20 DIAGNOSIS — F0150 Vascular dementia without behavioral disturbance: Secondary | ICD-10-CM | POA: Diagnosis not present

## 2023-06-21 DIAGNOSIS — F0150 Vascular dementia without behavioral disturbance: Secondary | ICD-10-CM | POA: Diagnosis not present

## 2023-06-22 ENCOUNTER — Other Ambulatory Visit: Payer: Self-pay | Admitting: Family Medicine

## 2023-06-22 DIAGNOSIS — I10 Essential (primary) hypertension: Secondary | ICD-10-CM

## 2023-06-22 DIAGNOSIS — F0150 Vascular dementia without behavioral disturbance: Secondary | ICD-10-CM | POA: Diagnosis not present

## 2023-06-22 DIAGNOSIS — E7800 Pure hypercholesterolemia, unspecified: Secondary | ICD-10-CM

## 2023-06-22 NOTE — Telephone Encounter (Signed)
Last Fill: Cozaar: 02/26/23     Keflex: 03/01/23     Coreg: 02/26/23     Lipitor: 02/26/23     Norvasc: 02/26/23  Last OV: 04/10/23 Next OV: 10/09/23  Routing to provider for review/authorization.

## 2023-06-22 NOTE — Telephone Encounter (Signed)
Copied from CRM [id number]. Topic: Clinical - Medication Refill >> Jun 22, 2023  2:52 PM Phill Myron wrote: Most Recent Primary Care Visit:  Provider: Hoy Register  Department: [facility]  Visit Type: OFFICE VISIT  Date: 04/10/2023  Medication:  amLODipine (NORVASC) 10 MG tablet atorvastatin (LIPITOR) 20 MG tablet carvedilol (COREG) 3.125 MG tablet cephALEXin (KEFLEX) 500 MG capsul losartan (COZAAR) 25 MG tablet   [facility] [address] [place], [place] - [address]  [address] [place] [address]  Phone: [phone] Fax: [phone]  Hours: Not open 24 hours          Has the patient contacted their pharmacy? No (Agent: If no, request that the patient contact the pharmacy for the refill. If patient does not wish to contact the pharmacy document the reason why and proceed with request.) (Agent: If yes, when and what did the pharmacy advise?)  Is this the correct pharmacy for this prescription? Yes If no, delete pharmacy and type the correct one.  This is the patient's preferred pharmacy: No Pharmacies Listed  Has the prescription been filled recently? Yes  Is the patient out of the medication? Yes  Has the patient been seen for an appointment in the last year OR does the patient have an upcoming appointment? Yes  Can we respond through MyChart? No  Agent: Please be advised that Rx refills may take up to 3 business days. We ask that you follow-up with your pharmacy.

## 2023-06-23 DIAGNOSIS — R32 Unspecified urinary incontinence: Secondary | ICD-10-CM | POA: Diagnosis not present

## 2023-06-23 DIAGNOSIS — F0390 Unspecified dementia without behavioral disturbance: Secondary | ICD-10-CM | POA: Diagnosis not present

## 2023-06-25 DIAGNOSIS — F0150 Vascular dementia without behavioral disturbance: Secondary | ICD-10-CM | POA: Diagnosis not present

## 2023-06-26 DIAGNOSIS — F0150 Vascular dementia without behavioral disturbance: Secondary | ICD-10-CM | POA: Diagnosis not present

## 2023-06-27 DIAGNOSIS — F0150 Vascular dementia without behavioral disturbance: Secondary | ICD-10-CM | POA: Diagnosis not present

## 2023-06-28 DIAGNOSIS — F0150 Vascular dementia without behavioral disturbance: Secondary | ICD-10-CM | POA: Diagnosis not present

## 2023-06-29 ENCOUNTER — Other Ambulatory Visit: Payer: Self-pay

## 2023-06-29 DIAGNOSIS — F0150 Vascular dementia without behavioral disturbance: Secondary | ICD-10-CM | POA: Diagnosis not present

## 2023-07-02 DIAGNOSIS — F0150 Vascular dementia without behavioral disturbance: Secondary | ICD-10-CM | POA: Diagnosis not present

## 2023-07-03 DIAGNOSIS — F0150 Vascular dementia without behavioral disturbance: Secondary | ICD-10-CM | POA: Diagnosis not present

## 2023-07-04 ENCOUNTER — Other Ambulatory Visit: Payer: Self-pay

## 2023-07-04 DIAGNOSIS — F0150 Vascular dementia without behavioral disturbance: Secondary | ICD-10-CM | POA: Diagnosis not present

## 2023-07-05 DIAGNOSIS — F0150 Vascular dementia without behavioral disturbance: Secondary | ICD-10-CM | POA: Diagnosis not present

## 2023-07-06 DIAGNOSIS — F0150 Vascular dementia without behavioral disturbance: Secondary | ICD-10-CM | POA: Diagnosis not present

## 2023-07-09 DIAGNOSIS — F0150 Vascular dementia without behavioral disturbance: Secondary | ICD-10-CM | POA: Diagnosis not present

## 2023-07-10 DIAGNOSIS — F0150 Vascular dementia without behavioral disturbance: Secondary | ICD-10-CM | POA: Diagnosis not present

## 2023-07-11 DIAGNOSIS — F0150 Vascular dementia without behavioral disturbance: Secondary | ICD-10-CM | POA: Diagnosis not present

## 2023-07-12 DIAGNOSIS — F0150 Vascular dementia without behavioral disturbance: Secondary | ICD-10-CM | POA: Diagnosis not present

## 2023-07-13 DIAGNOSIS — F0150 Vascular dementia without behavioral disturbance: Secondary | ICD-10-CM | POA: Diagnosis not present

## 2023-07-16 DIAGNOSIS — F0150 Vascular dementia without behavioral disturbance: Secondary | ICD-10-CM | POA: Diagnosis not present

## 2023-07-17 DIAGNOSIS — F0150 Vascular dementia without behavioral disturbance: Secondary | ICD-10-CM | POA: Diagnosis not present

## 2023-07-18 DIAGNOSIS — F0150 Vascular dementia without behavioral disturbance: Secondary | ICD-10-CM | POA: Diagnosis not present

## 2023-07-19 DIAGNOSIS — F0150 Vascular dementia without behavioral disturbance: Secondary | ICD-10-CM | POA: Diagnosis not present

## 2023-07-20 DIAGNOSIS — F0150 Vascular dementia without behavioral disturbance: Secondary | ICD-10-CM | POA: Diagnosis not present

## 2023-07-23 DIAGNOSIS — F0150 Vascular dementia without behavioral disturbance: Secondary | ICD-10-CM | POA: Diagnosis not present

## 2023-07-24 DIAGNOSIS — F0150 Vascular dementia without behavioral disturbance: Secondary | ICD-10-CM | POA: Diagnosis not present

## 2023-07-25 DIAGNOSIS — F0150 Vascular dementia without behavioral disturbance: Secondary | ICD-10-CM | POA: Diagnosis not present

## 2023-07-26 DIAGNOSIS — F0150 Vascular dementia without behavioral disturbance: Secondary | ICD-10-CM | POA: Diagnosis not present

## 2023-07-27 DIAGNOSIS — F0150 Vascular dementia without behavioral disturbance: Secondary | ICD-10-CM | POA: Diagnosis not present

## 2023-07-30 DIAGNOSIS — F0150 Vascular dementia without behavioral disturbance: Secondary | ICD-10-CM | POA: Diagnosis not present

## 2023-07-31 DIAGNOSIS — F0150 Vascular dementia without behavioral disturbance: Secondary | ICD-10-CM | POA: Diagnosis not present

## 2023-08-01 DIAGNOSIS — F0150 Vascular dementia without behavioral disturbance: Secondary | ICD-10-CM | POA: Diagnosis not present

## 2023-08-02 DIAGNOSIS — F0150 Vascular dementia without behavioral disturbance: Secondary | ICD-10-CM | POA: Diagnosis not present

## 2023-08-03 DIAGNOSIS — F0150 Vascular dementia without behavioral disturbance: Secondary | ICD-10-CM | POA: Diagnosis not present

## 2023-08-06 DIAGNOSIS — F0150 Vascular dementia without behavioral disturbance: Secondary | ICD-10-CM | POA: Diagnosis not present

## 2023-08-07 DIAGNOSIS — F0150 Vascular dementia without behavioral disturbance: Secondary | ICD-10-CM | POA: Diagnosis not present

## 2023-08-08 DIAGNOSIS — F0150 Vascular dementia without behavioral disturbance: Secondary | ICD-10-CM | POA: Diagnosis not present

## 2023-08-09 DIAGNOSIS — F0150 Vascular dementia without behavioral disturbance: Secondary | ICD-10-CM | POA: Diagnosis not present

## 2023-08-10 DIAGNOSIS — F0150 Vascular dementia without behavioral disturbance: Secondary | ICD-10-CM | POA: Diagnosis not present

## 2023-08-13 DIAGNOSIS — F0150 Vascular dementia without behavioral disturbance: Secondary | ICD-10-CM | POA: Diagnosis not present

## 2023-08-14 DIAGNOSIS — F0150 Vascular dementia without behavioral disturbance: Secondary | ICD-10-CM | POA: Diagnosis not present

## 2023-08-15 DIAGNOSIS — F0150 Vascular dementia without behavioral disturbance: Secondary | ICD-10-CM | POA: Diagnosis not present

## 2023-08-16 DIAGNOSIS — F0150 Vascular dementia without behavioral disturbance: Secondary | ICD-10-CM | POA: Diagnosis not present

## 2023-08-17 DIAGNOSIS — F0150 Vascular dementia without behavioral disturbance: Secondary | ICD-10-CM | POA: Diagnosis not present

## 2023-08-20 DIAGNOSIS — F0150 Vascular dementia without behavioral disturbance: Secondary | ICD-10-CM | POA: Diagnosis not present

## 2023-08-21 DIAGNOSIS — F0150 Vascular dementia without behavioral disturbance: Secondary | ICD-10-CM | POA: Diagnosis not present

## 2023-08-22 DIAGNOSIS — F0150 Vascular dementia without behavioral disturbance: Secondary | ICD-10-CM | POA: Diagnosis not present

## 2023-08-23 DIAGNOSIS — F0150 Vascular dementia without behavioral disturbance: Secondary | ICD-10-CM | POA: Diagnosis not present

## 2023-08-24 DIAGNOSIS — F0150 Vascular dementia without behavioral disturbance: Secondary | ICD-10-CM | POA: Diagnosis not present

## 2023-08-27 DIAGNOSIS — F0150 Vascular dementia without behavioral disturbance: Secondary | ICD-10-CM | POA: Diagnosis not present

## 2023-08-28 DIAGNOSIS — F0150 Vascular dementia without behavioral disturbance: Secondary | ICD-10-CM | POA: Diagnosis not present

## 2023-08-29 DIAGNOSIS — F0150 Vascular dementia without behavioral disturbance: Secondary | ICD-10-CM | POA: Diagnosis not present

## 2023-08-30 DIAGNOSIS — F0150 Vascular dementia without behavioral disturbance: Secondary | ICD-10-CM | POA: Diagnosis not present

## 2023-08-31 DIAGNOSIS — F0150 Vascular dementia without behavioral disturbance: Secondary | ICD-10-CM | POA: Diagnosis not present

## 2023-09-03 DIAGNOSIS — F0150 Vascular dementia without behavioral disturbance: Secondary | ICD-10-CM | POA: Diagnosis not present

## 2023-09-04 DIAGNOSIS — F0150 Vascular dementia without behavioral disturbance: Secondary | ICD-10-CM | POA: Diagnosis not present

## 2023-09-05 DIAGNOSIS — F0150 Vascular dementia without behavioral disturbance: Secondary | ICD-10-CM | POA: Diagnosis not present

## 2023-09-06 DIAGNOSIS — F0150 Vascular dementia without behavioral disturbance: Secondary | ICD-10-CM | POA: Diagnosis not present

## 2023-09-07 DIAGNOSIS — F0150 Vascular dementia without behavioral disturbance: Secondary | ICD-10-CM | POA: Diagnosis not present

## 2023-09-10 DIAGNOSIS — F0150 Vascular dementia without behavioral disturbance: Secondary | ICD-10-CM | POA: Diagnosis not present

## 2023-09-11 DIAGNOSIS — F0150 Vascular dementia without behavioral disturbance: Secondary | ICD-10-CM | POA: Diagnosis not present

## 2023-09-12 DIAGNOSIS — F0150 Vascular dementia without behavioral disturbance: Secondary | ICD-10-CM | POA: Diagnosis not present

## 2023-09-13 DIAGNOSIS — F0150 Vascular dementia without behavioral disturbance: Secondary | ICD-10-CM | POA: Diagnosis not present

## 2023-09-14 DIAGNOSIS — F0150 Vascular dementia without behavioral disturbance: Secondary | ICD-10-CM | POA: Diagnosis not present

## 2023-09-17 DIAGNOSIS — F0150 Vascular dementia without behavioral disturbance: Secondary | ICD-10-CM | POA: Diagnosis not present

## 2023-09-18 DIAGNOSIS — F0150 Vascular dementia without behavioral disturbance: Secondary | ICD-10-CM | POA: Diagnosis not present

## 2023-09-19 DIAGNOSIS — F0150 Vascular dementia without behavioral disturbance: Secondary | ICD-10-CM | POA: Diagnosis not present

## 2023-09-20 DIAGNOSIS — F0150 Vascular dementia without behavioral disturbance: Secondary | ICD-10-CM | POA: Diagnosis not present

## 2023-09-21 DIAGNOSIS — F0150 Vascular dementia without behavioral disturbance: Secondary | ICD-10-CM | POA: Diagnosis not present

## 2023-09-24 DIAGNOSIS — F0150 Vascular dementia without behavioral disturbance: Secondary | ICD-10-CM | POA: Diagnosis not present

## 2023-09-25 DIAGNOSIS — F0150 Vascular dementia without behavioral disturbance: Secondary | ICD-10-CM | POA: Diagnosis not present

## 2023-09-26 DIAGNOSIS — F0150 Vascular dementia without behavioral disturbance: Secondary | ICD-10-CM | POA: Diagnosis not present

## 2023-09-27 DIAGNOSIS — F0150 Vascular dementia without behavioral disturbance: Secondary | ICD-10-CM | POA: Diagnosis not present

## 2023-09-28 DIAGNOSIS — F0150 Vascular dementia without behavioral disturbance: Secondary | ICD-10-CM | POA: Diagnosis not present

## 2023-10-01 DIAGNOSIS — F0150 Vascular dementia without behavioral disturbance: Secondary | ICD-10-CM | POA: Diagnosis not present

## 2023-10-02 DIAGNOSIS — F0150 Vascular dementia without behavioral disturbance: Secondary | ICD-10-CM | POA: Diagnosis not present

## 2023-10-03 DIAGNOSIS — F0150 Vascular dementia without behavioral disturbance: Secondary | ICD-10-CM | POA: Diagnosis not present

## 2023-10-04 DIAGNOSIS — F0150 Vascular dementia without behavioral disturbance: Secondary | ICD-10-CM | POA: Diagnosis not present

## 2023-10-05 DIAGNOSIS — F0150 Vascular dementia without behavioral disturbance: Secondary | ICD-10-CM | POA: Diagnosis not present

## 2023-10-08 DIAGNOSIS — F0150 Vascular dementia without behavioral disturbance: Secondary | ICD-10-CM | POA: Diagnosis not present

## 2023-10-09 ENCOUNTER — Ambulatory Visit: Payer: Medicaid Other | Attending: Family Medicine | Admitting: Family Medicine

## 2023-10-09 ENCOUNTER — Encounter: Payer: Self-pay | Admitting: Family Medicine

## 2023-10-09 VITALS — BP 119/81 | HR 90 | Ht 63.0 in | Wt 86.0 lb

## 2023-10-09 DIAGNOSIS — E7800 Pure hypercholesterolemia, unspecified: Secondary | ICD-10-CM | POA: Diagnosis not present

## 2023-10-09 DIAGNOSIS — I129 Hypertensive chronic kidney disease with stage 1 through stage 4 chronic kidney disease, or unspecified chronic kidney disease: Secondary | ICD-10-CM

## 2023-10-09 DIAGNOSIS — N183 Chronic kidney disease, stage 3 unspecified: Secondary | ICD-10-CM | POA: Diagnosis not present

## 2023-10-09 DIAGNOSIS — F0150 Vascular dementia without behavioral disturbance: Secondary | ICD-10-CM | POA: Diagnosis not present

## 2023-10-09 DIAGNOSIS — R634 Abnormal weight loss: Secondary | ICD-10-CM | POA: Diagnosis not present

## 2023-10-09 DIAGNOSIS — R7303 Prediabetes: Secondary | ICD-10-CM | POA: Diagnosis not present

## 2023-10-09 MED ORDER — ATORVASTATIN CALCIUM 20 MG PO TABS: 20.0000 mg | ORAL_TABLET | Freq: Every day | ORAL | 1 refills | Status: DC

## 2023-10-09 MED ORDER — AMLODIPINE BESYLATE 10 MG PO TABS: 10.0000 mg | ORAL_TABLET | Freq: Every day | ORAL | 1 refills | Status: DC

## 2023-10-09 MED ORDER — LOSARTAN POTASSIUM 25 MG PO TABS: 25.0000 mg | ORAL_TABLET | Freq: Every day | ORAL | 1 refills | Status: DC

## 2023-10-09 MED ORDER — CARVEDILOL 3.125 MG PO TABS: 3.1250 mg | ORAL_TABLET | Freq: Two times a day (BID) | ORAL | 1 refills | Status: DC

## 2023-10-09 MED ORDER — MIRTAZAPINE 15 MG PO TABS: 15.0000 mg | ORAL_TABLET | Freq: Every day | ORAL | 1 refills | Status: DC

## 2023-10-09 NOTE — Progress Notes (Signed)
 Subjective:  Patient ID: Erica Lopez, female    DOB: 1961-04-17  Age: 63 y.o. MRN: [id number]  CC: Medical Management of Chronic Issues (Discuss weight loss/Diarrhea)     Discussed the use of AI scribe software for clinical note transcription with the patient, who gave verbal consent to proceed.  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  She presents with weight loss and diarrhea.   Erica Lopez has experienced weight loss over the past few years, decreasing from 95 pounds in 2022 to 86 pounds currently but in the last 6 months she has lost 2 pounds, despite increased caloric intake with Ensure protein shakes. Her caregiver is concerned about this weight loss.  She had episodes of diarrhea over the weekend, starting on Saturday, with stools described as very dark, possibly black, and associated with a drop in blood pressure causing near syncope. Her blood pressure returned to normal after the episode. On Sunday, her stools were brown, and there have been no further episodes of diarrhea since then. She has no current diarrhea or changes in appetite.  She has chronic kidney disease, stage 3, and is on amlodipine , carvedilol , and losartan  for blood pressure control, along with a cholesterol medication. Her caregiver reports difficulty finding a dentist that accepts Medicaid, and there is concern about her oral health due to bleeding from the mouth and halitosis.    Past Medical History:  Diagnosis Date   Hypertension    Stroke (HCC) 2014    No past surgical history on file.  Family History  Problem Relation Age of Onset   Cancer Other    Hypertension Other    Stroke Other    Heart disease Other    Diabetes Other     Social History   Socioeconomic History    Marital status: Single    Spouse name: Not on file   Number of children: Not on file   Years of education: Not on file   Highest education level: Not on file  Occupational History   Not on file  Tobacco Use   Smoking status: Never   Smokeless tobacco: Never  Substance and Sexual Activity   Alcohol use: No   Drug use: No   Sexual activity: Not on file  Other Topics Concern   Not on file  Social History Narrative   Not on file   Social Drivers of Health   Financial Resource Strain: Low Risk  (02/26/2023)   Overall Financial Resource Strain (CARDIA)    Difficulty of Paying Living Expenses: Not very hard  Food Insecurity: No Food Insecurity (02/26/2023)   Hunger Vital Sign    Worried About Running Out of Food in the Last Year: Never true    Ran Out of Food in the Last Year: Never true  Transportation Needs: No Transportation Needs (02/26/2023)   PRAPARE - Administrator, Civil Service (Medical): No    Lack of Transportation (Non-Medical): No  Physical Activity: Inactive (02/26/2023)   Exercise Vital Sign    Days of Exercise per Week: 0 days    Minutes of Exercise per Session: 0 min  Stress: No Stress Concern Present (02/26/2023)   [employer] of Occupational Health - Occupational Stress Questionnaire    Feeling of Stress : Only a little  Social Connections:  Moderately Isolated (02/26/2023)   Social Connection and Isolation Panel [NHANES]    Frequency of Communication with Friends and Family: Once a week    Frequency of Social Gatherings with Friends and Family: Once a week    Attends Religious Services: 1 to 4 times per year    Active Member of [employer] or Organizations: Yes    Attends Banker Meetings: Never    Marital Status: Never married    Allergies  Allergen Reactions   Aspirin Anaphylaxis and Other (See Comments)    Bleeding event   Lisinopril  Swelling   Other Hives, Swelling and Other (See Comments)    Blueberries, causing hives and  swelling    Outpatient Medications Prior to Visit  Medication Sig Dispense Refill   diclofenac  Sodium (VOLTAREN ) 1 % GEL Apply 4 g topically 4 (four) times daily. 100 g 1   Misc. Devices MISC Bedside Commode 1 each 0   Misc. Devices MISC Shower chair.  Diagnosis - Dementia 1 each 0   triamcinolone  (KENALOG ) 0.025 % ointment Apply 1 application topically 2 (two) times daily. 30 g 0   amLODipine  (NORVASC ) 10 MG tablet Take 1 tablet (10 mg total) by mouth daily. 90 tablet 1   atorvastatin  (LIPITOR) 20 MG tablet Take 1 tablet (20 mg total) by mouth daily. 90 tablet 1   carvedilol  (COREG ) 3.125 MG tablet Take 1 tablet (3.125 mg total) by mouth 2 (two) times daily with a meal. 180 tablet 1   cephALEXin  (KEFLEX ) 500 MG capsule Take 1 capsule (500 mg total) by mouth 2 (two) times daily. (Patient not taking: Reported on 10/09/2023) 6 capsule 0   losartan  (COZAAR ) 25 MG tablet Take 1 tablet (25 mg total) by mouth daily. (Patient not taking: Reported on 10/09/2023) 90 tablet 1   No facility-administered medications prior to visit.     ROS Review of Systems  Constitutional:  Negative for activity change and appetite change.  HENT:  Negative for sinus pressure and sore throat.   Respiratory:  Negative for chest tightness, shortness of breath and wheezing.   Cardiovascular:  Negative for chest pain and palpitations.  Gastrointestinal:  Negative for abdominal distention, abdominal pain and constipation.  Genitourinary: Negative.   Musculoskeletal: Negative.   Psychiatric/Behavioral:  Negative for behavioral problems and dysphoric mood.     Objective:  BP 119/81   Pulse 90   Ht 5\' 3"  (1.6 m)   Wt 86 lb (39 kg)   SpO2 99%   BMI 15.23 kg/m      10/09/2023    3:09 PM 04/10/2023    3:02 PM 02/26/2023    3:56 PM  BP/Weight  Systolic BP 119 120 106  Diastolic BP 81 73 71  Wt. (Lbs) 86 88.4   BMI 15.23 kg/m2 15.66 kg/m2     Wt Readings from Last 3 Encounters:  10/09/23 86 lb (39 kg)   04/10/23 88 lb 6.4 oz (40.1 kg)  02/26/23 88 lb 6.4 oz (40.1 kg)      Physical Exam Constitutional:      Appearance: She is well-developed.     Comments: Underweight  Cardiovascular:     Rate and Rhythm: Normal rate.     Heart sounds: Normal heart sounds. No murmur heard. Pulmonary:     Effort: Pulmonary effort is normal.     Breath sounds: Normal breath sounds. No wheezing or rales.  Chest:     Chest wall: No tenderness.  Abdominal:     General: Bowel  sounds are normal. There is no distension.     Palpations: Abdomen is soft. There is no mass.     Tenderness: There is no abdominal tenderness.  Musculoskeletal:        General: Normal range of motion.     Right lower leg: No edema.     Left lower leg: No edema.  Neurological:     Mental Status: She is alert and oriented to person, place, and time.  Psychiatric:        Mood and Affect: Mood normal.        Latest Ref Rng & Units 07/07/2022    5:33 PM 02/28/2022    3:25 PM 09/06/2021    3:25 PM  CMP  Glucose 70 - 99 mg/dL 161  096  045   BUN 8 - 23 mg/dL 17  22  20    Creatinine 0.44 - 1.00 mg/dL 4.09  8.11  9.14   Sodium 135 - 145 mmol/L 140  141  143   Potassium 3.5 - 5.1 mmol/L 4.2  4.3  4.4   Chloride 98 - 111 mmol/L 106  105  103   CO2 22 - 32 mmol/L 25  19  21    Calcium  8.9 - 10.3 mg/dL 8.9  9.8  78.2   Total Protein 6.5 - 8.1 g/dL 7.3     Total Bilirubin 0.3 - 1.2 mg/dL 0.5     Alkaline Phos 38 - 126 U/L 61     AST 15 - 41 U/L 19     ALT 0 - 44 U/L 19       Lipid Panel     Component Value Date/Time   CHOL 224 (H) 05/19/2021 1158   TRIG 111 05/19/2021 1158   HDL 79 05/19/2021 1158   LDLCALC 126 (H) 05/19/2021 1158    CBC    Component Value Date/Time   WBC 7.2 07/07/2022 1733   RBC 5.07 07/07/2022 1733   HGB 11.3 (L) 07/07/2022 1733   HGB 12.6 08/17/2021 1428   HCT 37.1 07/07/2022 1733   HCT 42.9 08/17/2021 1428   PLT 184 07/07/2022 1733   PLT 232 08/17/2021 1428   MCV 73.2 (L) 07/07/2022 1733    MCV 73 (L) 08/17/2021 1428   MCH 22.3 (L) 07/07/2022 1733   MCHC 30.5 07/07/2022 1733   RDW 17.2 (H) 07/07/2022 1733   RDW 18.0 (H) 08/17/2021 1428   LYMPHSABS 0.8 07/07/2022 1733   LYMPHSABS 1.2 08/17/2021 1428   MONOABS 0.4 07/07/2022 1733   EOSABS 0.0 07/07/2022 1733   EOSABS 0.0 08/17/2021 1428   BASOSABS 0.0 07/07/2022 1733   BASOSABS 0.0 08/17/2021 1428    Lab Results  Component Value Date   HGBA1C 5.7 (H) 08/17/2021   Lab Results  Component Value Date   TSH 1.210 06/12/2017       1. Benign hypertension with chronic kidney disease, stage III (HCC) (Primary) Controlled blood pressure She has hypertensive nephropathy Avoid nephrotoxins Will check renal function today Counseled on blood pressure goal of less than 130/80, low-sodium, DASH diet, medication compliance, 150 minutes of moderate intensity exercise per week. Discussed medication compliance, adverse effects. - amLODipine  (NORVASC ) 10 MG tablet; Take 1 tablet (10 mg total) by mouth daily.  Dispense: 90 tablet; Refill: 1 - carvedilol  (COREG ) 3.125 MG tablet; Take 1 tablet (3.125 mg total) by mouth 2 (two) times daily with a meal.  Dispense: 180 tablet; Refill: 1 - losartan  (COZAAR ) 25 MG tablet; Take 1 tablet (25 mg  total) by mouth daily.  Dispense: 90 tablet; Refill: 1 - CMP14+EGFR  2. Pure hypercholesterolemia Uncontrolled from 2022 Unable to obtain repeat level given she is not fasting Continue statin - atorvastatin  (LIPITOR) 20 MG tablet; Take 1 tablet (20 mg total) by mouth daily.  Dispense: 90 tablet; Refill: 1  3. Weight loss Malignancy workup so far has been negative: Negative mammogram, negative Cologuard in 2022 Will check TSH again Will also perform imaging on abdomen and chest -Will initiate mirtazapine  - mirtazapine  (REMERON ) 15 MG tablet; Take 1 tablet (15 mg total) by mouth at bedtime.  Dispense: 90 tablet; Refill: 1 - CT ABDOMEN PELVIS W CONTRAST; Future - DG Chest 2 View; Future - T4,  free - TSH - T3  4. Prediabetes Last A1c was 5.7 Work on lifestyle modification to prevent progression to type 2 diabetes mellitus - Hemoglobin A1c   Meds ordered this encounter  Medications   mirtazapine  (REMERON ) 15 MG tablet    Sig: Take 1 tablet (15 mg total) by mouth at bedtime.    Dispense:  90 tablet    Refill:  1   amLODipine  (NORVASC ) 10 MG tablet    Sig: Take 1 tablet (10 mg total) by mouth daily.    Dispense:  90 tablet    Refill:  1   atorvastatin  (LIPITOR) 20 MG tablet    Sig: Take 1 tablet (20 mg total) by mouth daily.    Dispense:  90 tablet    Refill:  1   carvedilol  (COREG ) 3.125 MG tablet    Sig: Take 1 tablet (3.125 mg total) by mouth 2 (two) times daily with a meal.    Dispense:  180 tablet    Refill:  1   losartan  (COZAAR ) 25 MG tablet    Sig: Take 1 tablet (25 mg total) by mouth daily.    Dispense:  90 tablet    Refill:  1    Follow-up: Return in about 6 months (around 04/10/2024) for Chronic medical conditions.       Joaquin Mulberry, MD, FAAFP. [facility] [place], [place] [phone]   10/09/2023, 3:36 PM

## 2023-10-10 DIAGNOSIS — F0150 Vascular dementia without behavioral disturbance: Secondary | ICD-10-CM | POA: Diagnosis not present

## 2023-10-10 LAB — CMP14+EGFR
ALT: 21 IU/L (ref 0–32)
AST: 25 IU/L (ref 0–40)
Albumin: 4.5 g/dL (ref 3.9–4.9)
Alkaline Phosphatase: 80 IU/L (ref 44–121)
BUN/Creatinine Ratio: 16 (ref 12–28)
BUN: 22 mg/dL (ref 8–27)
Bilirubin Total: 0.2 mg/dL (ref 0.0–1.2)
CO2: 17 mmol/L — ABNORMAL LOW (ref 20–29)
Calcium: 9.8 mg/dL (ref 8.7–10.3)
Chloride: 104 mmol/L (ref 96–106)
Creatinine, Ser: 1.39 mg/dL — ABNORMAL HIGH (ref 0.57–1.00)
Globulin, Total: 3.6 g/dL (ref 1.5–4.5)
Glucose: 133 mg/dL — ABNORMAL HIGH (ref 70–99)
Potassium: 4.5 mmol/L (ref 3.5–5.2)
Sodium: 144 mmol/L (ref 134–144)
Total Protein: 8.1 g/dL (ref 6.0–8.5)
eGFR: 42 mL/min/{1.73_m2} — ABNORMAL LOW (ref >59)

## 2023-10-10 LAB — T4, FREE: Free T4: 1.25 ng/dL (ref 0.82–1.77)

## 2023-10-10 LAB — TSH: TSH: 1.21 u[IU]/mL (ref 0.450–4.500)

## 2023-10-10 LAB — HEMOGLOBIN A1C
Est. average glucose Bld gHb Est-mCnc: 131 mg/dL
Hgb A1c MFr Bld: 6.2 % — ABNORMAL HIGH (ref 4.8–5.6)

## 2023-10-10 LAB — T3: T3, Total: 71 ng/dL (ref 71–180)

## 2023-10-11 ENCOUNTER — Ambulatory Visit: Payer: Self-pay | Admitting: Family Medicine

## 2023-10-11 DIAGNOSIS — F0390 Unspecified dementia without behavioral disturbance: Secondary | ICD-10-CM | POA: Diagnosis not present

## 2023-10-11 DIAGNOSIS — F0150 Vascular dementia without behavioral disturbance: Secondary | ICD-10-CM | POA: Diagnosis not present

## 2023-10-11 DIAGNOSIS — R32 Unspecified urinary incontinence: Secondary | ICD-10-CM | POA: Diagnosis not present

## 2023-10-12 DIAGNOSIS — F0150 Vascular dementia without behavioral disturbance: Secondary | ICD-10-CM | POA: Diagnosis not present

## 2023-10-15 DIAGNOSIS — F0150 Vascular dementia without behavioral disturbance: Secondary | ICD-10-CM | POA: Diagnosis not present

## 2023-10-16 DIAGNOSIS — F0150 Vascular dementia without behavioral disturbance: Secondary | ICD-10-CM | POA: Diagnosis not present

## 2023-10-16 NOTE — Telephone Encounter (Signed)
 Copied from CRM [id number]. Topic: Clinical - Lab/Test Results >> Oct 16, 2023  2:06 PM Star East wrote:  Reason for CRM: daughter Vivianne Grosser calling with questions about lab results- please call [phone]

## 2023-10-17 DIAGNOSIS — F0150 Vascular dementia without behavioral disturbance: Secondary | ICD-10-CM | POA: Diagnosis not present

## 2023-10-18 DIAGNOSIS — F0150 Vascular dementia without behavioral disturbance: Secondary | ICD-10-CM | POA: Diagnosis not present

## 2023-10-19 DIAGNOSIS — F0150 Vascular dementia without behavioral disturbance: Secondary | ICD-10-CM | POA: Diagnosis not present

## 2023-10-22 DIAGNOSIS — F0150 Vascular dementia without behavioral disturbance: Secondary | ICD-10-CM | POA: Diagnosis not present

## 2023-10-23 DIAGNOSIS — F0150 Vascular dementia without behavioral disturbance: Secondary | ICD-10-CM | POA: Diagnosis not present

## 2023-10-24 DIAGNOSIS — F0150 Vascular dementia without behavioral disturbance: Secondary | ICD-10-CM | POA: Diagnosis not present

## 2023-10-25 DIAGNOSIS — F0150 Vascular dementia without behavioral disturbance: Secondary | ICD-10-CM | POA: Diagnosis not present

## 2023-10-26 DIAGNOSIS — F0150 Vascular dementia without behavioral disturbance: Secondary | ICD-10-CM | POA: Diagnosis not present

## 2023-10-29 DIAGNOSIS — F0150 Vascular dementia without behavioral disturbance: Secondary | ICD-10-CM | POA: Diagnosis not present

## 2023-10-30 ENCOUNTER — Encounter: Payer: Self-pay | Admitting: Family Medicine

## 2023-10-30 DIAGNOSIS — F0150 Vascular dementia without behavioral disturbance: Secondary | ICD-10-CM | POA: Diagnosis not present

## 2023-10-31 DIAGNOSIS — F0150 Vascular dementia without behavioral disturbance: Secondary | ICD-10-CM | POA: Diagnosis not present

## 2023-11-01 ENCOUNTER — Ambulatory Visit
Admission: RE | Admit: 2023-11-01 | Discharge: 2023-11-01 | Disposition: A | Discharge status: Home / Self Care | Source: Ambulatory Visit | Attending: Family Medicine | Admitting: Family Medicine

## 2023-11-01 DIAGNOSIS — F0150 Vascular dementia without behavioral disturbance: Secondary | ICD-10-CM | POA: Diagnosis not present

## 2023-11-01 DIAGNOSIS — K760 Fatty (change of) liver, not elsewhere classified: Secondary | ICD-10-CM | POA: Diagnosis not present

## 2023-11-01 DIAGNOSIS — R634 Abnormal weight loss: Secondary | ICD-10-CM

## 2023-11-01 MED ORDER — IOPAMIDOL (ISOVUE-300) INJECTION 61%
75.0000 mL | Freq: Once | INTRAVENOUS | Status: AC | PRN
  Administered 2023-11-01: 75 mL via INTRAVENOUS

## 2023-11-02 DIAGNOSIS — F0150 Vascular dementia without behavioral disturbance: Secondary | ICD-10-CM | POA: Diagnosis not present

## 2023-11-05 ENCOUNTER — Telehealth: Payer: Self-pay | Admitting: Family Medicine

## 2023-11-05 DIAGNOSIS — F0150 Vascular dementia without behavioral disturbance: Secondary | ICD-10-CM | POA: Diagnosis not present

## 2023-11-05 MED ORDER — POLYETHYLENE GLYCOL 3350 17 GM/SCOOP PO POWD: 17.0000 g | Freq: Every day | ORAL | 1 refills | Status: AC

## 2023-11-05 NOTE — Telephone Encounter (Signed)
 Yes this is correct.

## 2023-11-05 NOTE — Addendum Note (Signed)
 Addended by: Versie Fleener on: 11/05/2023 12:33 PM   Modules accepted: Orders

## 2023-11-05 NOTE — Telephone Encounter (Signed)
 Copied from CRM [id number]. Topic: Clinical - Prescription Issue >> Nov 05, 2023 12:52 PM Stanly Early wrote:  Reason for CRM: polyethylene glycol powder (GLYCOLAX/MIRALAX) 17 GM/SCOOP powder [[id number]] Bria from [facility] is asking if the quantity of 3,350 is correct?

## 2023-11-06 DIAGNOSIS — F0150 Vascular dementia without behavioral disturbance: Secondary | ICD-10-CM | POA: Diagnosis not present

## 2023-11-06 NOTE — Telephone Encounter (Signed)
 Pharmacy was called and informed that the correct dosage was sent in

## 2023-11-07 DIAGNOSIS — F0150 Vascular dementia without behavioral disturbance: Secondary | ICD-10-CM | POA: Diagnosis not present

## 2023-11-08 DIAGNOSIS — F0150 Vascular dementia without behavioral disturbance: Secondary | ICD-10-CM | POA: Diagnosis not present

## 2023-11-09 DIAGNOSIS — F0150 Vascular dementia without behavioral disturbance: Secondary | ICD-10-CM | POA: Diagnosis not present

## 2023-11-12 DIAGNOSIS — F0150 Vascular dementia without behavioral disturbance: Secondary | ICD-10-CM | POA: Diagnosis not present

## 2023-11-13 DIAGNOSIS — F0150 Vascular dementia without behavioral disturbance: Secondary | ICD-10-CM | POA: Diagnosis not present

## 2023-11-14 DIAGNOSIS — F0150 Vascular dementia without behavioral disturbance: Secondary | ICD-10-CM | POA: Diagnosis not present

## 2023-11-15 DIAGNOSIS — F0150 Vascular dementia without behavioral disturbance: Secondary | ICD-10-CM | POA: Diagnosis not present

## 2023-11-16 DIAGNOSIS — F0150 Vascular dementia without behavioral disturbance: Secondary | ICD-10-CM | POA: Diagnosis not present

## 2023-11-19 DIAGNOSIS — F0150 Vascular dementia without behavioral disturbance: Secondary | ICD-10-CM | POA: Diagnosis not present

## 2023-11-20 DIAGNOSIS — F0150 Vascular dementia without behavioral disturbance: Secondary | ICD-10-CM | POA: Diagnosis not present

## 2023-11-21 DIAGNOSIS — F0150 Vascular dementia without behavioral disturbance: Secondary | ICD-10-CM | POA: Diagnosis not present

## 2023-11-22 DIAGNOSIS — F0150 Vascular dementia without behavioral disturbance: Secondary | ICD-10-CM | POA: Diagnosis not present

## 2023-11-23 DIAGNOSIS — F0150 Vascular dementia without behavioral disturbance: Secondary | ICD-10-CM | POA: Diagnosis not present

## 2023-11-26 DIAGNOSIS — F0150 Vascular dementia without behavioral disturbance: Secondary | ICD-10-CM | POA: Diagnosis not present

## 2023-11-27 DIAGNOSIS — F0150 Vascular dementia without behavioral disturbance: Secondary | ICD-10-CM | POA: Diagnosis not present

## 2023-11-28 DIAGNOSIS — F0150 Vascular dementia without behavioral disturbance: Secondary | ICD-10-CM | POA: Diagnosis not present

## 2023-11-29 DIAGNOSIS — F0150 Vascular dementia without behavioral disturbance: Secondary | ICD-10-CM | POA: Diagnosis not present

## 2023-11-30 DIAGNOSIS — F0150 Vascular dementia without behavioral disturbance: Secondary | ICD-10-CM | POA: Diagnosis not present

## 2023-12-03 DIAGNOSIS — F0150 Vascular dementia without behavioral disturbance: Secondary | ICD-10-CM | POA: Diagnosis not present

## 2023-12-04 DIAGNOSIS — F0150 Vascular dementia without behavioral disturbance: Secondary | ICD-10-CM | POA: Diagnosis not present

## 2023-12-05 DIAGNOSIS — F0150 Vascular dementia without behavioral disturbance: Secondary | ICD-10-CM | POA: Diagnosis not present

## 2023-12-06 DIAGNOSIS — F0150 Vascular dementia without behavioral disturbance: Secondary | ICD-10-CM | POA: Diagnosis not present

## 2023-12-07 DIAGNOSIS — F0150 Vascular dementia without behavioral disturbance: Secondary | ICD-10-CM | POA: Diagnosis not present

## 2023-12-10 DIAGNOSIS — F0150 Vascular dementia without behavioral disturbance: Secondary | ICD-10-CM | POA: Diagnosis not present

## 2023-12-11 DIAGNOSIS — F0150 Vascular dementia without behavioral disturbance: Secondary | ICD-10-CM | POA: Diagnosis not present

## 2023-12-12 DIAGNOSIS — F0150 Vascular dementia without behavioral disturbance: Secondary | ICD-10-CM | POA: Diagnosis not present

## 2023-12-13 DIAGNOSIS — F0150 Vascular dementia without behavioral disturbance: Secondary | ICD-10-CM | POA: Diagnosis not present

## 2023-12-14 DIAGNOSIS — F0150 Vascular dementia without behavioral disturbance: Secondary | ICD-10-CM | POA: Diagnosis not present

## 2023-12-17 DIAGNOSIS — F0150 Vascular dementia without behavioral disturbance: Secondary | ICD-10-CM | POA: Diagnosis not present

## 2023-12-18 DIAGNOSIS — F0150 Vascular dementia without behavioral disturbance: Secondary | ICD-10-CM | POA: Diagnosis not present

## 2023-12-19 DIAGNOSIS — F0150 Vascular dementia without behavioral disturbance: Secondary | ICD-10-CM | POA: Diagnosis not present

## 2023-12-20 DIAGNOSIS — F0150 Vascular dementia without behavioral disturbance: Secondary | ICD-10-CM | POA: Diagnosis not present

## 2023-12-21 DIAGNOSIS — F0150 Vascular dementia without behavioral disturbance: Secondary | ICD-10-CM | POA: Diagnosis not present

## 2023-12-24 DIAGNOSIS — F0150 Vascular dementia without behavioral disturbance: Secondary | ICD-10-CM | POA: Diagnosis not present

## 2023-12-25 DIAGNOSIS — F0150 Vascular dementia without behavioral disturbance: Secondary | ICD-10-CM | POA: Diagnosis not present

## 2023-12-26 DIAGNOSIS — F0150 Vascular dementia without behavioral disturbance: Secondary | ICD-10-CM | POA: Diagnosis not present

## 2023-12-27 DIAGNOSIS — F0150 Vascular dementia without behavioral disturbance: Secondary | ICD-10-CM | POA: Diagnosis not present

## 2023-12-28 DIAGNOSIS — F0150 Vascular dementia without behavioral disturbance: Secondary | ICD-10-CM | POA: Diagnosis not present

## 2023-12-31 DIAGNOSIS — F0390 Unspecified dementia without behavioral disturbance: Secondary | ICD-10-CM | POA: Diagnosis not present

## 2023-12-31 DIAGNOSIS — F0150 Vascular dementia without behavioral disturbance: Secondary | ICD-10-CM | POA: Diagnosis not present

## 2023-12-31 DIAGNOSIS — R32 Unspecified urinary incontinence: Secondary | ICD-10-CM | POA: Diagnosis not present

## 2024-01-01 DIAGNOSIS — F0150 Vascular dementia without behavioral disturbance: Secondary | ICD-10-CM | POA: Diagnosis not present

## 2024-01-03 DIAGNOSIS — F0150 Vascular dementia without behavioral disturbance: Secondary | ICD-10-CM | POA: Diagnosis not present

## 2024-01-04 DIAGNOSIS — F0150 Vascular dementia without behavioral disturbance: Secondary | ICD-10-CM | POA: Diagnosis not present

## 2024-01-07 DIAGNOSIS — F0150 Vascular dementia without behavioral disturbance: Secondary | ICD-10-CM | POA: Diagnosis not present

## 2024-01-08 DIAGNOSIS — F0150 Vascular dementia without behavioral disturbance: Secondary | ICD-10-CM | POA: Diagnosis not present

## 2024-01-09 DIAGNOSIS — F0150 Vascular dementia without behavioral disturbance: Secondary | ICD-10-CM | POA: Diagnosis not present

## 2024-01-10 DIAGNOSIS — F0150 Vascular dementia without behavioral disturbance: Secondary | ICD-10-CM | POA: Diagnosis not present

## 2024-01-11 DIAGNOSIS — F0150 Vascular dementia without behavioral disturbance: Secondary | ICD-10-CM | POA: Diagnosis not present

## 2024-01-14 DIAGNOSIS — F0150 Vascular dementia without behavioral disturbance: Secondary | ICD-10-CM | POA: Diagnosis not present

## 2024-01-15 DIAGNOSIS — F0150 Vascular dementia without behavioral disturbance: Secondary | ICD-10-CM | POA: Diagnosis not present

## 2024-01-16 DIAGNOSIS — F0150 Vascular dementia without behavioral disturbance: Secondary | ICD-10-CM | POA: Diagnosis not present

## 2024-01-17 DIAGNOSIS — F0150 Vascular dementia without behavioral disturbance: Secondary | ICD-10-CM | POA: Diagnosis not present

## 2024-01-18 DIAGNOSIS — F0150 Vascular dementia without behavioral disturbance: Secondary | ICD-10-CM | POA: Diagnosis not present

## 2024-01-21 DIAGNOSIS — F0150 Vascular dementia without behavioral disturbance: Secondary | ICD-10-CM | POA: Diagnosis not present

## 2024-01-22 DIAGNOSIS — F0150 Vascular dementia without behavioral disturbance: Secondary | ICD-10-CM | POA: Diagnosis not present

## 2024-01-23 DIAGNOSIS — F0150 Vascular dementia without behavioral disturbance: Secondary | ICD-10-CM | POA: Diagnosis not present

## 2024-01-24 DIAGNOSIS — F0150 Vascular dementia without behavioral disturbance: Secondary | ICD-10-CM | POA: Diagnosis not present

## 2024-01-25 DIAGNOSIS — F0150 Vascular dementia without behavioral disturbance: Secondary | ICD-10-CM | POA: Diagnosis not present

## 2024-01-28 DIAGNOSIS — F0150 Vascular dementia without behavioral disturbance: Secondary | ICD-10-CM | POA: Diagnosis not present

## 2024-01-29 DIAGNOSIS — F0150 Vascular dementia without behavioral disturbance: Secondary | ICD-10-CM | POA: Diagnosis not present

## 2024-01-30 DIAGNOSIS — F0150 Vascular dementia without behavioral disturbance: Secondary | ICD-10-CM | POA: Diagnosis not present

## 2024-01-31 DIAGNOSIS — F0150 Vascular dementia without behavioral disturbance: Secondary | ICD-10-CM | POA: Diagnosis not present

## 2024-02-01 DIAGNOSIS — F0150 Vascular dementia without behavioral disturbance: Secondary | ICD-10-CM | POA: Diagnosis not present

## 2024-02-04 DIAGNOSIS — F0150 Vascular dementia without behavioral disturbance: Secondary | ICD-10-CM | POA: Diagnosis not present

## 2024-02-05 DIAGNOSIS — F0150 Vascular dementia without behavioral disturbance: Secondary | ICD-10-CM | POA: Diagnosis not present

## 2024-02-06 DIAGNOSIS — F0150 Vascular dementia without behavioral disturbance: Secondary | ICD-10-CM | POA: Diagnosis not present

## 2024-02-07 ENCOUNTER — Telehealth: Payer: Self-pay

## 2024-02-07 DIAGNOSIS — F0150 Vascular dementia without behavioral disturbance: Secondary | ICD-10-CM | POA: Diagnosis not present

## 2024-02-07 NOTE — Telephone Encounter (Signed)
 Fax received from [employer] stating  a new PCS request is needed as the patient's current services expire 04/05/2024.    The patient will need a visit with PCP prior to submitting the form because it has been more than 90 days since she was seen by her PCP.   I called the patient/ [name]: [phone]  and had to leave a voicemail message requesting a call back.  We need to schedule an appointment for her with Dr Newlin

## 2024-02-08 DIAGNOSIS — F0150 Vascular dementia without behavioral disturbance: Secondary | ICD-10-CM | POA: Diagnosis not present

## 2024-02-11 DIAGNOSIS — F0150 Vascular dementia without behavioral disturbance: Secondary | ICD-10-CM | POA: Diagnosis not present

## 2024-02-11 NOTE — Telephone Encounter (Signed)
 I called the patient/ [name]: [phone]  to schedule an appointment with Dr Newlin because patient needs to be re-certified to continue to receive PCS.  I had to leave a voicemail message requesting a call back.

## 2024-02-11 NOTE — Telephone Encounter (Signed)
 Call received from patient's daughter, Erica Lopez.  I explained that her mother will need an office visit with PCS prior to 04/05/2024 if she would like to continue to receive PCS.  Her services expire  on that date.  [name] said she was at work and would call me back tomorrow with some date/times that she is available for an appointment for her mother.

## 2024-02-12 DIAGNOSIS — F0150 Vascular dementia without behavioral disturbance: Secondary | ICD-10-CM | POA: Diagnosis not present

## 2024-02-13 DIAGNOSIS — F0150 Vascular dementia without behavioral disturbance: Secondary | ICD-10-CM | POA: Diagnosis not present

## 2024-02-14 DIAGNOSIS — F0150 Vascular dementia without behavioral disturbance: Secondary | ICD-10-CM | POA: Diagnosis not present

## 2024-02-15 DIAGNOSIS — F0150 Vascular dementia without behavioral disturbance: Secondary | ICD-10-CM | POA: Diagnosis not present

## 2024-02-17 DIAGNOSIS — R32 Unspecified urinary incontinence: Secondary | ICD-10-CM | POA: Diagnosis not present

## 2024-02-17 DIAGNOSIS — F0390 Unspecified dementia without behavioral disturbance: Secondary | ICD-10-CM | POA: Diagnosis not present

## 2024-02-18 DIAGNOSIS — F0150 Vascular dementia without behavioral disturbance: Secondary | ICD-10-CM | POA: Diagnosis not present

## 2024-02-18 NOTE — Telephone Encounter (Signed)
 I have not heard back from patient's daughter, [name], so I called her again: [phone]  to schedule an appointment with Dr Newlin because patient needs to be re-certified to continue to receive PCS.  I had to leave a voicemail message requesting a call back.

## 2024-02-18 NOTE — Telephone Encounter (Signed)
 Patient's daughter returned call. Appointment scheduled with Dr. Newlin.

## 2024-02-19 DIAGNOSIS — F0150 Vascular dementia without behavioral disturbance: Secondary | ICD-10-CM | POA: Diagnosis not present

## 2024-02-20 DIAGNOSIS — F0150 Vascular dementia without behavioral disturbance: Secondary | ICD-10-CM | POA: Diagnosis not present

## 2024-02-21 DIAGNOSIS — F0150 Vascular dementia without behavioral disturbance: Secondary | ICD-10-CM | POA: Diagnosis not present

## 2024-02-22 DIAGNOSIS — F0150 Vascular dementia without behavioral disturbance: Secondary | ICD-10-CM | POA: Diagnosis not present

## 2024-02-25 DIAGNOSIS — F0150 Vascular dementia without behavioral disturbance: Secondary | ICD-10-CM | POA: Diagnosis not present

## 2024-02-26 DIAGNOSIS — F0150 Vascular dementia without behavioral disturbance: Secondary | ICD-10-CM | POA: Diagnosis not present

## 2024-02-27 DIAGNOSIS — F0150 Vascular dementia without behavioral disturbance: Secondary | ICD-10-CM | POA: Diagnosis not present

## 2024-02-28 DIAGNOSIS — F0150 Vascular dementia without behavioral disturbance: Secondary | ICD-10-CM | POA: Diagnosis not present

## 2024-02-29 DIAGNOSIS — F0150 Vascular dementia without behavioral disturbance: Secondary | ICD-10-CM | POA: Diagnosis not present

## 2024-03-03 DIAGNOSIS — F0150 Vascular dementia without behavioral disturbance: Secondary | ICD-10-CM | POA: Diagnosis not present

## 2024-03-04 DIAGNOSIS — F0150 Vascular dementia without behavioral disturbance: Secondary | ICD-10-CM | POA: Diagnosis not present

## 2024-03-05 DIAGNOSIS — F0150 Vascular dementia without behavioral disturbance: Secondary | ICD-10-CM | POA: Diagnosis not present

## 2024-03-06 DIAGNOSIS — F0150 Vascular dementia without behavioral disturbance: Secondary | ICD-10-CM | POA: Diagnosis not present

## 2024-03-07 DIAGNOSIS — F0150 Vascular dementia without behavioral disturbance: Secondary | ICD-10-CM | POA: Diagnosis not present

## 2024-03-10 DIAGNOSIS — F0150 Vascular dementia without behavioral disturbance: Secondary | ICD-10-CM | POA: Diagnosis not present

## 2024-03-11 DIAGNOSIS — F0150 Vascular dementia without behavioral disturbance: Secondary | ICD-10-CM | POA: Diagnosis not present

## 2024-03-12 DIAGNOSIS — F0150 Vascular dementia without behavioral disturbance: Secondary | ICD-10-CM | POA: Diagnosis not present

## 2024-03-13 DIAGNOSIS — F0150 Vascular dementia without behavioral disturbance: Secondary | ICD-10-CM | POA: Diagnosis not present

## 2024-03-14 DIAGNOSIS — F0150 Vascular dementia without behavioral disturbance: Secondary | ICD-10-CM | POA: Diagnosis not present

## 2024-03-17 ENCOUNTER — Ambulatory Visit: Admitting: Family Medicine

## 2024-03-17 ENCOUNTER — Telehealth: Payer: Self-pay | Admitting: Family Medicine

## 2024-03-17 DIAGNOSIS — F0150 Vascular dementia without behavioral disturbance: Secondary | ICD-10-CM | POA: Diagnosis not present

## 2024-03-17 NOTE — Telephone Encounter (Signed)
 Copied from CRM #[id number]. Topic: General - Other >> Mar 17, 2024 11:29 AM Myrick T wrote: Reason for CRM: Amber from [facility] called to f/u on the 3051 form that was faxed on 9/17 and 9/18. Patient requested that once form is recvd and completed that it be faxed back to [phone] asap

## 2024-03-17 NOTE — Telephone Encounter (Signed)
 Patient has upcoming appointment to discuss PCS needs.

## 2024-03-18 DIAGNOSIS — F0150 Vascular dementia without behavioral disturbance: Secondary | ICD-10-CM | POA: Diagnosis not present

## 2024-03-19 DIAGNOSIS — F0150 Vascular dementia without behavioral disturbance: Secondary | ICD-10-CM | POA: Diagnosis not present

## 2024-03-20 ENCOUNTER — Ambulatory Visit: Attending: Family Medicine | Admitting: Family Medicine

## 2024-03-20 ENCOUNTER — Encounter: Payer: Self-pay | Admitting: Family Medicine

## 2024-03-20 VITALS — BP 105/68 | HR 94 | Temp 98.3°F | Ht 63.0 in | Wt 88.8 lb

## 2024-03-20 DIAGNOSIS — Z7902 Long term (current) use of antithrombotics/antiplatelets: Secondary | ICD-10-CM | POA: Diagnosis not present

## 2024-03-20 DIAGNOSIS — E7800 Pure hypercholesterolemia, unspecified: Secondary | ICD-10-CM | POA: Diagnosis not present

## 2024-03-20 DIAGNOSIS — F03918 Unspecified dementia, unspecified severity, with other behavioral disturbance: Secondary | ICD-10-CM

## 2024-03-20 DIAGNOSIS — I129 Hypertensive chronic kidney disease with stage 1 through stage 4 chronic kidney disease, or unspecified chronic kidney disease: Secondary | ICD-10-CM | POA: Diagnosis not present

## 2024-03-20 DIAGNOSIS — M25552 Pain in left hip: Secondary | ICD-10-CM | POA: Diagnosis not present

## 2024-03-20 DIAGNOSIS — E1169 Type 2 diabetes mellitus with other specified complication: Secondary | ICD-10-CM

## 2024-03-20 DIAGNOSIS — Z79899 Other long term (current) drug therapy: Secondary | ICD-10-CM

## 2024-03-20 DIAGNOSIS — N183 Chronic kidney disease, stage 3 unspecified: Secondary | ICD-10-CM

## 2024-03-20 DIAGNOSIS — F0150 Vascular dementia without behavioral disturbance: Secondary | ICD-10-CM | POA: Diagnosis not present

## 2024-03-20 DIAGNOSIS — E440 Moderate protein-calorie malnutrition: Secondary | ICD-10-CM

## 2024-03-20 LAB — POCT GLYCOSYLATED HEMOGLOBIN (HGB A1C): HbA1c, POC (prediabetic range): 6.5 % — AB (ref 5.7–6.4)

## 2024-03-20 MED ORDER — ATORVASTATIN CALCIUM 20 MG PO TABS: 20.0000 mg | ORAL_TABLET | Freq: Every day | ORAL | 1 refills | Status: AC

## 2024-03-20 MED ORDER — CARVEDILOL 3.125 MG PO TABS: 3.1250 mg | ORAL_TABLET | Freq: Two times a day (BID) | ORAL | 1 refills | Status: AC

## 2024-03-20 MED ORDER — MIRTAZAPINE 30 MG PO TABS: 30.0000 mg | ORAL_TABLET | Freq: Every day | ORAL | 1 refills | Status: AC

## 2024-03-20 MED ORDER — AMLODIPINE BESYLATE 10 MG PO TABS: 10.0000 mg | ORAL_TABLET | Freq: Every day | ORAL | 1 refills | Status: AC

## 2024-03-20 MED ORDER — LOSARTAN POTASSIUM 25 MG PO TABS: 25.0000 mg | ORAL_TABLET | Freq: Every day | ORAL | 1 refills | Status: AC

## 2024-03-20 NOTE — Progress Notes (Signed)
 Subjective:  Patient ID: Erica Lopez, female    DOB: 02/05/1961  Age: 63 y.o. MRN: [id number]  CC: Medical Management of Chronic Issues (Notes for PCS )     Discussed the use of AI scribe software for clinical note transcription with the patient, who gave verbal consent to proceed.  History of Present Illness Erica Lopez is a 63 year old female with a history of CVA in 2014 with resulting mild cognitive deficits and no hemiparesis, vascular dementia, hypertension, Protein calorie malnutrition  who presents for recertification of PCS services and evaluation of left-sided soreness after a fall.   She experiences mild soreness on her left hip following a recent fall. The pain is very mild and does not significantly impact her daily activities. She has not informed her daughter about the fall.  She has memory issues due to a previous stroke and takes Namenda to slow dementia progression. Increased stiffness occurs on weekends without her nurse, sometimes leading to incontinence. Stiffness worsens with weather changes and weight.  Her nutritional intake is concerning as she struggles to gain weight despite eating. Dietary monitoring is challenging per daughter due to restrictions on sugar, sodium, and carbohydrates for prediabetes. Remeron  at bedtime has increased her appetite, particularly for sweets. Her weight remains stable at 86-88 pounds over the past year. Today her A1c is at 6.5 up from 6.2 previously with a new diagnosis of type 2 diabetes mellitus She is on medication for blood pressure and cholesterol, with excellent blood pressure control.    Past Medical History:  Diagnosis Date   Hypertension    Stroke (HCC) 2014    No past surgical history on file.  Family History  Problem Relation Age of Onset   Cancer Other    Hypertension Other    Stroke Other    Heart disease Other    Diabetes Other     Social History   Socioeconomic History   Marital status: Single     Spouse name: Not on file   Number of children: Not on file   Years of education: Not on file   Highest education level: Not on file  Occupational History   Not on file  Tobacco Use   Smoking status: Never   Smokeless tobacco: Never  Substance and Sexual Activity   Alcohol use: No   Drug use: No   Sexual activity: Not on file  Other Topics Concern   Not on file  Social History Narrative   Not on file   Social Drivers of Health   Financial Resource Strain: Low Risk  (02/26/2023)   Overall Financial Resource Strain (CARDIA)    Difficulty of Paying Living Expenses: Not very hard  Food Insecurity: No Food Insecurity (02/26/2023)   Hunger Vital Sign    Worried About Running Out of Food in the Last Year: Never true    Ran Out of Food in the Last Year: Never true  Transportation Needs: No Transportation Needs (02/26/2023)   PRAPARE - Administrator, Civil Service (Medical): No    Lack of Transportation (Non-Medical): No  Physical Activity: Inactive (02/26/2023)   Exercise Vital Sign    Days of Exercise per Week: 0 days    Minutes of Exercise per Session: 0 min  Stress: No Stress Concern Present (02/26/2023)   [employer] of Occupational Health - Occupational Stress Questionnaire    Feeling of Stress : Only a little  Social Connections: Moderately Isolated (02/26/2023)   Social Connection and  Isolation Panel    Frequency of Communication with Friends and Family: Once a week    Frequency of Social Gatherings with Friends and Family: Once a week    Attends Religious Services: 1 to 4 times per year    Active Member of [employer] or Organizations: Yes    Attends Banker Meetings: Never    Marital Status: Never married    Allergies  Allergen Reactions   Aspirin Anaphylaxis and Other (See Comments)    Bleeding event   Lisinopril  Swelling   Other Hives, Swelling and Other (See Comments)    Blueberries, causing hives and swelling    Outpatient Medications  Prior to Visit  Medication Sig Dispense Refill   diclofenac  Sodium (VOLTAREN ) 1 % GEL Apply 4 g topically 4 (four) times daily. 100 g 1   Misc. Devices MISC Bedside Commode 1 each 0   Misc. Devices MISC Shower chair.  Diagnosis - Dementia 1 each 0   polyethylene glycol powder (GLYCOLAX /MIRALAX ) 17 GM/SCOOP powder Take 17 g by mouth daily. 3350 g 1   triamcinolone  (KENALOG ) 0.025 % ointment Apply 1 application topically 2 (two) times daily. 30 g 0   amLODipine  (NORVASC ) 10 MG tablet Take 1 tablet (10 mg total) by mouth daily. 90 tablet 1   atorvastatin  (LIPITOR) 20 MG tablet Take 1 tablet (20 mg total) by mouth daily. 90 tablet 1   carvedilol  (COREG ) 3.125 MG tablet Take 1 tablet (3.125 mg total) by mouth 2 (two) times daily with a meal. 180 tablet 1   losartan  (COZAAR ) 25 MG tablet Take 1 tablet (25 mg total) by mouth daily. 90 tablet 1   mirtazapine  (REMERON ) 15 MG tablet Take 1 tablet (15 mg total) by mouth at bedtime. 90 tablet 1   No facility-administered medications prior to visit.     ROS Review of Systems  Constitutional:  Positive for appetite change. Negative for activity change.  HENT:  Negative for sinus pressure and sore throat.   Respiratory:  Negative for chest tightness, shortness of breath and wheezing.   Cardiovascular:  Negative for chest pain and palpitations.  Gastrointestinal:  Negative for abdominal distention, abdominal pain and constipation.  Genitourinary: Negative.   Musculoskeletal: Negative.   Psychiatric/Behavioral:  Negative for behavioral problems and dysphoric mood.     Objective:  BP 105/68   Pulse 94   Temp 98.3 F (36.8 C) (Oral)   Ht 5' 3 (1.6 m)   Wt 88 lb 12.8 oz (40.3 kg)   SpO2 98%   BMI 15.73 kg/m      03/20/2024    4:01 PM 10/09/2023    3:09 PM 04/10/2023    3:02 PM  BP/Weight  Systolic BP 105 119 120  Diastolic BP 68 81 73  Wt. (Lbs) 88.8 86 88.4  BMI 15.73 kg/m2 15.23 kg/m2 15.66 kg/m2    Wt Readings from Last 3  Encounters:  03/20/24 88 lb 12.8 oz (40.3 kg)  10/09/23 86 lb (39 kg)  04/10/23 88 lb 6.4 oz (40.1 kg)     Physical Exam Constitutional:      Appearance: She is well-developed.     Comments: Malnourished  Cardiovascular:     Rate and Rhythm: Normal rate.     Heart sounds: Normal heart sounds. No murmur heard. Pulmonary:     Effort: Pulmonary effort is normal.     Breath sounds: Normal breath sounds. No wheezing or rales.  Chest:     Chest wall: No tenderness.  Abdominal:  General: Bowel sounds are normal. There is no distension.     Palpations: Abdomen is soft. There is no mass.     Tenderness: There is no abdominal tenderness.  Musculoskeletal:     Right lower leg: No edema.     Left lower leg: No edema.     Comments: No tenderness on palpation of left hip  Neurological:     Mental Status: She is alert and oriented to person, place, and time.  Psychiatric:        Mood and Affect: Mood normal.        Latest Ref Rng & Units 10/09/2023    3:42 PM 07/07/2022    5:33 PM 02/28/2022    3:25 PM  CMP  Glucose 70 - 99 mg/dL 866  796  898   BUN 8 - 27 mg/dL 22  17  22    Creatinine 0.57 - 1.00 mg/dL 8.60  8.51  8.66   Sodium 134 - 144 mmol/L 144  140  141   Potassium 3.5 - 5.2 mmol/L 4.5  4.2  4.3   Chloride 96 - 106 mmol/L 104  106  105   CO2 20 - 29 mmol/L 17  25  19    Calcium  8.7 - 10.3 mg/dL 9.8  8.9  9.8   Total Protein 6.0 - 8.5 g/dL 8.1  7.3    Total Bilirubin 0.0 - 1.2 mg/dL 0.2  0.5    Alkaline Phos 44 - 121 IU/L 80  61    AST 0 - 40 IU/L 25  19    ALT 0 - 32 IU/L 21  19      Lipid Panel     Component Value Date/Time   CHOL 224 (H) 05/19/2021 1158   TRIG 111 05/19/2021 1158   HDL 79 05/19/2021 1158   LDLCALC 126 (H) 05/19/2021 1158    CBC    Component Value Date/Time   WBC 7.2 07/07/2022 1733   RBC 5.07 07/07/2022 1733   HGB 11.3 (L) 07/07/2022 1733   HGB 12.6 08/17/2021 1428   HCT 37.1 07/07/2022 1733   HCT 42.9 08/17/2021 1428   PLT 184  07/07/2022 1733   PLT 232 08/17/2021 1428   MCV 73.2 (L) 07/07/2022 1733   MCV 73 (L) 08/17/2021 1428   MCH 22.3 (L) 07/07/2022 1733   MCHC 30.5 07/07/2022 1733   RDW 17.2 (H) 07/07/2022 1733   RDW 18.0 (H) 08/17/2021 1428   LYMPHSABS 0.8 07/07/2022 1733   LYMPHSABS 1.2 08/17/2021 1428   MONOABS 0.4 07/07/2022 1733   EOSABS 0.0 07/07/2022 1733   EOSABS 0.0 08/17/2021 1428   BASOSABS 0.0 07/07/2022 1733   BASOSABS 0.0 08/17/2021 1428    Lab Results  Component Value Date   HGBA1C 6.5 (A) 03/20/2024       Assessment & Plan Protein caloric malnutrition Persistent weight loss despite adequate intake. CT abdomen and pelvis negative for cancer. Weight stable at 86-88 pounds over the last year. - Refer to nutritionist for dietary management. - Provide healthy meal plan. - Recommend high-protein supplements like Ensure or Glucerna, up to three times daily if tolerated.  Dementia with behavioral disturbances Dementia managed with Namenda to slow progression. Stroke increases dementia risk. - Continue Namenda for memory issues.  Benign hypertension with stage 3 chronic kidney disease Blood pressure well-controlled on current regimen. - Continue current blood pressure medication regimen. -Counseled on blood pressure goal of less than 130/80, low-sodium, DASH diet, medication compliance, 150 minutes of moderate  intensity exercise per week. Discussed medication compliance, adverse effects.   Pure hypercholesterolemia Cholesterol levels managed with current medication. - Continue current cholesterol medication regimen.  Type 2 diabetes mellitus New diagnosis with A1c of 6.5 which has progressed from 6.2 previously Due to protein caloric malnutrition I am hesitant to place her on metformin and she is likely to become hypoglycemic with a sulfonylurea Managed with dietary modifications to prevent progression. I will have her follow-up with the clinical pharmacist for diabetic  education I will see her back in 3 months to ensure her A1c does not trend up but if it does then we will need to initiate medication.  Left hip pain and stiffness Mild pain and stiffness, possibly due to arthritis or recent fall. - Prescribe Voltaren  gel for topical application to the hip.      Meds ordered this encounter  Medications   mirtazapine  (REMERON ) 30 MG tablet    Sig: Take 1 tablet (30 mg total) by mouth at bedtime.    Dispense:  90 tablet    Refill:  1    Dose increase   amLODipine  (NORVASC ) 10 MG tablet    Sig: Take 1 tablet (10 mg total) by mouth daily.    Dispense:  90 tablet    Refill:  1   atorvastatin  (LIPITOR) 20 MG tablet    Sig: Take 1 tablet (20 mg total) by mouth daily.    Dispense:  90 tablet    Refill:  1   carvedilol  (COREG ) 3.125 MG tablet    Sig: Take 1 tablet (3.125 mg total) by mouth 2 (two) times daily with a meal.    Dispense:  180 tablet    Refill:  1   losartan  (COZAAR ) 25 MG tablet    Sig: Take 1 tablet (25 mg total) by mouth daily.    Dispense:  90 tablet    Refill:  1    Follow-up: Return in about 1 month (around 04/20/2024) for DM education with Herlene, 3 months PCP medical conditions.       Corrina Sabin, MD, FAAFP. [facility] [place], [place] [phone]   03/20/2024, 5:48 PM

## 2024-03-20 NOTE — Patient Instructions (Signed)
Nutrition Concepts Prudent Diet LOW FAT, LOW CHOLESTEROL,3 GRAM SODIUM  This diet provides guidelines for selecting foods low in total fat, saturated fat and cholesterol without added salt  BENEFITS Helps decrease your blood cholesterol level Helps control your blood pressure  GUIDELINES Reduce total fat by eating less margarine, salad dressing and oil.  Avoid fried foods, fatty meat, and whole milk items, including cheese and ice cream. Choose foods low in saturated fat, which usually come from animal fats.  Tree plant oils, coconut, palm and palm kernel are very high in saturated fat. Selected foods low in cholesterol.  Cholesterol is only found in foods from animal fats. Foods from plants contain no cholesterol. Eat less salt and sodium.  Processed, cured and canned foods are usually high in salt. Prepare food without salt or with a small amount of salt in cooking (no more than 1/4 tsp per day)  Do not add salt at the table. Eat more vegetables fruits, breads, cereals, pasta, rice and dry beans and peas.  These foods contain little or no fat or cholesterol. Eat more fiber.  The type of fiber in oats, barley, dry beans and peas, and many fruits and vegetables help lower blood cholesterol levels.    Food Groups Choose Go Easy On Avoid  Meats Poultry Fish Dry Beans Eggs Nuts Lean cuts of meat Chicken [place] Fish Egg Whites Beans/Tofu Shellfish Duck Egg yolks Nuts Processed meat such as bacon and bologna Hot Dogs   Milk Yogurt Cheese Fat-Free or low-fat dairy products Skim or 1% Milk Cheese with no more than 3 grams of fat per ounce Low fat yogurt 2% fat milk Sour cream Whole milk Swiss,American Cheddar cheese Cream cheese  Fats, oils Corn Olive Canola Sunflower oils Avocados Olives Peanut oil Butter, lard Bacon Fat Coconut oil Solid shortening  Breads, cereals, pasta, rice Whole-grain bread Whole wheat pasta Whole grain rice Plain baked potato  Granola Biscuits Muffins Cornbread Croissants Pastries Egg Noodles Doughnuts  Fruits Vegetables Fresh, frozen Dried Canned fruit in syrup Coconut Vegetables prepared in oil  Snacks Sorbet Low-fat yogurt Plain popcorn Pretzels Fruits/Veggies Homemade cakes, cookies and pies with unsaturated oils Baked chips Ice cream Chocolate Potato chips Buttered popcorn     GETTING STARTED ON CHOLESTEROL CONTROL  Learn about cholesterol, understand how your heart works and discover how high cholesterol can cause heart disease. Use this information to understand your body and talk to your doctor about your lab report numbers, your risk factors and your lifestyle. Take action.  Lifestyle changes such as exercising, eating healthier foods, and losing weight may help improve your cholesterol levels.  A SILENT PROBLEM  High cholesterol usually has no direct symptoms.  When your cholesterol is higher  Than it should be, you may not feel different, but cholesterol may still do damage to blood vessels.  As blood flows through the vessels, it carries many of the important things the body needs to function, such as oxygen an cholesterol.  So, problems with blood vessels can lead to heart disease or stroke.  STEP 1: UNDERSTAND CHOLESTEROL   Cholesterol is a soft, fat-like, waxy substance in your bloodstream and in all your  body's cells.  It's normal to have cholesterol. It's an important part of a healthy  body.  But too high a level of cholesterol in the blood is a major risk factor for  heart disease, which can lead to a heart attack.  High cholesterol is also a major r risk factor for  stroke.    Cholesterol in the blood comes from 2 sources. One is food.  Cholesterol is also  produced naturally in your body based on your family history.   Most of the cholesterol in your blood is made by your own body.  In fact, only  about 25% of blood cholesterol comes from the food you eat.  Examples of foods  that  add to your cholesterol are meats, poultry, fish, eggs, butter, cheese and  whole milk.  THE KINDS OF FATS IN YOUR BODY  LDL-BAD CHOLESTEROL: LDL is often called "bad"cholesterol"  That is because if there's too much, it can build up in the body.  LDL forms a thick, hard substances that can clog your blood vessels and can block the flow of blood to your heart and brain. HDL-GOOD CHOLESTEROL-HDL is often called "good cholesterol".  It helps your body get rid of bad cholesterol.  HDL collects excess cholesterol that LDL has left behind in the body. TRIGLYCERIDES-Triglycerides are a very special form of fat.  Levels of triglycerides in blood can sometimes be too high.  Triglycerides travel in the bloodstream to be used for energy stored as body fat.   THE 2 SOURCES OF CHOLESTEROL IN YOUR BODY  The body takes cholesterol and triglycerides from food and allows them to enter the blood.  At the same time, the liver and other body cells produce cholesterol. If this process works well, arteries remain healthy.  If not, the cholesterol builds up.  This can lead to artery damage and heart disease.  HEALTHY LEVELS  A diet low in calories and fat may help you have healthy cholesterol levels.  When levels are healthy, cholesterol gets to whre it's needed in the body without building up inside the arteries.  UNHEALTHY LEVELS  Unhealthy levels of cholesterol may be caused  By poor eating habits.  Or the liver and body cells may make too much-a problem that often runs in families.  If LDL levels are high and HDL levels are low, excess cholesterol builds up.  This damages and clogs the arteries.  STEP 2: KNOW YOUR NUMBERS AND UNDERSTAND YOUR RISK  If your cholesterol level is too high, you could be heading for a heart attack or stroke without knowing it.  In general, the higher your cholesterol level and the more risk factors you have, the higher your risk of developing heart disease or having a heart attack.    High LDL cholesterol is one risk factor for heart disease.  Other risk factors, such as diabetes, also puts people at a higher risk.  Use this personal worksheet to learn about your risk factors so you can learn more about your risk of developing heart disease or stroke.  YOUR PERSONAL WORKSHEET Have your been diagnosed with heart disease? Have you had a stroke? Have you ever had a blockage in your legs arteries? Do you have diabetes? Do you have high blood pressure (140/90 or higher) or are  You on blood pressure medication? Do you have a family history of early heart disease (heart disease  In father or brother before age 17; heart disease in mother or sister before age 57y)? Do you smoke cigarettes? Are you a man age 63 or older or a woman age 35 or older? Do you have low HDL (good) cholesterol (less than 40 mg/dL)?  *the more "yes" answers you checked, the greater your risk of developing heart disease.   UNDERSTAND YOUR RESULTS  After you  finish the worksheet, talk to your doctor about what your LDL goal should be.  The answers you provide will help your doctor assess your risk of developing heart disease or having a heart attack.  WHY DO I HAVE HIGH CHOLESTEROL, AND WHAT CAN HAPPEN TO ME?  While some of your cholesterol is caused by your diet, your family history plays a larger role than many people think.  Like many people, you may not know your body produces cholesterol naturally, based on family history.  Having high LDL (bad) cholesterol levels can put you at risk of heart disease, heart attack or stroke.  This is especially true if you have any additional risk factors, such as those listed on the worksheet.  HOW YOUR HEART IS THREATENED  Over time, unhealthy cholesterol levels can narrow or block arteries, limiting blood flow. Plaque, a fatty material made mainly of cholesterol, can form in artery walls.  When blocked by plaque, the body parts that are not receiving enough blood  can become damaged.  This can be especially dangerour to the brain and heart.  HOW PLAQUE CAUSED TROUBLE Plaque buildup narrows the space inside the artery.  When this happens, the artery cannot carry as much blood. Plaque makes arteries stiff.  Stiff arteries cannot stretch to allow increase blood flow when it's needed.  For example, when exercising. Plaque can break open, causing blood clots to form on the surface of the plaque.  This can cut off blood flow. Small pieces of blood clot can break off from plaque and block smaller arteries. When blood flow is blocked, it can affect not only your heart, but also other parts of your body, including your brain.  This model shows some of the most common and serious effects of artery damage and blockage.   STEP 3 TAKE ACTION TO CONTROL YOUR CHOLESTEROL  What can I do?  Although there is no cure for high cholesterol, it can be controlled.  Lifestyle changes and medicine, if needed, can help restore your body's cholesterol balance and also can help control other risk factors.  MAKE A PLAN!  Along with your doctor, you can work out a plan to control your cholesterol.  Your plan will depend on your risk factors and test results.  It should include some or all of the following: Make changes to your diet.  Certain changes in the types of food you eat can help lower LDL, control high blood pressure and control diabetes. Exercise.  Exercise can help raise HDL and help control high blood pressure and diabetes.  Choose activities you enjoy and stick with them.  Make exercise a regular part of your life. Control your weight.  If you are overweight, aim to lose weight and maintain your weight loss. Stop Smoking.  Being smoke-free can improve your cholesterol and blood pressure.  Another good reason to quit is that smoking also damages your lungs, eyes, and skin not to mention the health of your family members. Be more active.  Activity is not just exercise that  makes you sweat.  It's also things like gardening or playing with your kids/grandkids.  Get in the habit of being more active throughout the day.  Cut down on TV time.  When doing errands, walk an extra few blocks instead of driving and park at each stop. Ask friends or family members to join in.  Talk to your doctor about starting some of the exercise tips listed below and stick to them.  Make  sure you talk to your doctor  Before starting any exercise program.  EXERCISE TIPS  AT HOME: Housework cleaning taking out the trash [place] work: mow [employer], rake the leaves, work in the garden If you have exercise equipment, use it! Pedal a stationary bike while watching TV Longer walks with the dog

## 2024-03-21 ENCOUNTER — Ambulatory Visit: Payer: Self-pay | Admitting: Family Medicine

## 2024-03-21 ENCOUNTER — Telehealth: Payer: Self-pay

## 2024-03-21 DIAGNOSIS — F0150 Vascular dementia without behavioral disturbance: Secondary | ICD-10-CM | POA: Diagnosis not present

## 2024-03-21 LAB — CMP14+EGFR
ALT: 50 IU/L — ABNORMAL HIGH (ref 0–32)
AST: 37 IU/L (ref 0–40)
Albumin: 4.3 g/dL (ref 3.9–4.9)
Alkaline Phosphatase: 94 IU/L (ref 49–135)
BUN/Creatinine Ratio: 19 (ref 12–28)
BUN: 26 mg/dL (ref 8–27)
Bilirubin Total: 0.2 mg/dL (ref 0.0–1.2)
CO2: 23 mmol/L (ref 20–29)
Calcium: 9.7 mg/dL (ref 8.7–10.3)
Chloride: 103 mmol/L (ref 96–106)
Creatinine, Ser: 1.38 mg/dL — ABNORMAL HIGH (ref 0.57–1.00)
Globulin, Total: 3.5 g/dL (ref 1.5–4.5)
Glucose: 83 mg/dL (ref 70–99)
Potassium: 4.5 mmol/L (ref 3.5–5.2)
Sodium: 141 mmol/L (ref 134–144)
Total Protein: 7.8 g/dL (ref 6.0–8.5)
eGFR: 43 mL/min/1.73 — ABNORMAL LOW (ref >59)

## 2024-03-21 NOTE — Telephone Encounter (Signed)
 Copied from CRM [id number]. Topic: General - Other >> Mar 21, 2024 11:15 AM Joesph NOVAK wrote: Reason for CRM: Amber from [facility] (investment banker, operational ) is calling about the pcs form. Patient was seen yesterday for the form to be filled out.   FAX: [phone].  PH: [phone].

## 2024-03-24 DIAGNOSIS — F0150 Vascular dementia without behavioral disturbance: Secondary | ICD-10-CM | POA: Diagnosis not present

## 2024-03-24 NOTE — Telephone Encounter (Signed)
 Signed PCS request efaxed to [facility]: [phone] and [facility] CACs: [id number].

## 2024-03-25 DIAGNOSIS — F0150 Vascular dementia without behavioral disturbance: Secondary | ICD-10-CM | POA: Diagnosis not present

## 2024-03-26 DIAGNOSIS — F0150 Vascular dementia without behavioral disturbance: Secondary | ICD-10-CM | POA: Diagnosis not present

## 2024-03-27 DIAGNOSIS — F0150 Vascular dementia without behavioral disturbance: Secondary | ICD-10-CM | POA: Diagnosis not present

## 2024-03-28 DIAGNOSIS — F0390 Unspecified dementia without behavioral disturbance: Secondary | ICD-10-CM | POA: Diagnosis not present

## 2024-03-28 DIAGNOSIS — R32 Unspecified urinary incontinence: Secondary | ICD-10-CM | POA: Diagnosis not present

## 2024-03-31 DIAGNOSIS — F0150 Vascular dementia without behavioral disturbance: Secondary | ICD-10-CM | POA: Diagnosis not present

## 2024-04-02 DIAGNOSIS — F0150 Vascular dementia without behavioral disturbance: Secondary | ICD-10-CM | POA: Diagnosis not present

## 2024-04-03 DIAGNOSIS — F0150 Vascular dementia without behavioral disturbance: Secondary | ICD-10-CM | POA: Diagnosis not present

## 2024-04-04 DIAGNOSIS — F0150 Vascular dementia without behavioral disturbance: Secondary | ICD-10-CM | POA: Diagnosis not present

## 2024-04-11 DIAGNOSIS — F0150 Vascular dementia without behavioral disturbance: Secondary | ICD-10-CM | POA: Diagnosis not present

## 2024-04-14 ENCOUNTER — Ambulatory Visit: Admitting: Family Medicine

## 2024-04-14 DIAGNOSIS — F0150 Vascular dementia without behavioral disturbance: Secondary | ICD-10-CM | POA: Diagnosis not present

## 2024-04-15 DIAGNOSIS — F0150 Vascular dementia without behavioral disturbance: Secondary | ICD-10-CM | POA: Diagnosis not present

## 2024-04-16 DIAGNOSIS — F0150 Vascular dementia without behavioral disturbance: Secondary | ICD-10-CM | POA: Diagnosis not present

## 2024-04-17 DIAGNOSIS — F0150 Vascular dementia without behavioral disturbance: Secondary | ICD-10-CM | POA: Diagnosis not present

## 2024-04-21 ENCOUNTER — Telehealth: Payer: Self-pay | Admitting: Family Medicine

## 2024-04-21 DIAGNOSIS — F0150 Vascular dementia without behavioral disturbance: Secondary | ICD-10-CM | POA: Diagnosis not present

## 2024-04-21 NOTE — Telephone Encounter (Signed)
 Unable to LVM to confirm appt for 12/2

## 2024-04-22 ENCOUNTER — Ambulatory Visit: Attending: Family Medicine | Admitting: Pharmacist

## 2024-04-22 ENCOUNTER — Encounter: Payer: Self-pay | Admitting: Pharmacist

## 2024-04-22 DIAGNOSIS — E1122 Type 2 diabetes mellitus with diabetic chronic kidney disease: Secondary | ICD-10-CM | POA: Diagnosis not present

## 2024-04-22 DIAGNOSIS — E1169 Type 2 diabetes mellitus with other specified complication: Secondary | ICD-10-CM

## 2024-04-22 DIAGNOSIS — Z79899 Other long term (current) drug therapy: Secondary | ICD-10-CM | POA: Diagnosis not present

## 2024-04-22 DIAGNOSIS — Z7902 Long term (current) use of antithrombotics/antiplatelets: Secondary | ICD-10-CM | POA: Diagnosis not present

## 2024-04-22 DIAGNOSIS — N1832 Chronic kidney disease, stage 3b: Secondary | ICD-10-CM | POA: Diagnosis not present

## 2024-04-22 DIAGNOSIS — F0150 Vascular dementia without behavioral disturbance: Secondary | ICD-10-CM | POA: Diagnosis not present

## 2024-04-22 MED ORDER — ACCU-CHEK SOFTCLIX LANCETS MISC: 6 refills | Status: AC

## 2024-04-22 MED ORDER — ACCU-CHEK GUIDE W/DEVICE KIT: PACK | 0 refills | Status: AC

## 2024-04-22 MED ORDER — ACCU-CHEK GUIDE TEST VI STRP: ORAL_STRIP | 6 refills | Status: AC

## 2024-04-22 NOTE — Progress Notes (Signed)
    S:     No chief complaint on file.  63 y.o. female who presents for diabetes evaluation, education, and management. Patient arrives in good spirits and presents with her daughter who serves as her historian.   PMH is significant for history of CVA in 2014 with resulting mild cognitive deficits and no hemiparesis, vascular dementia, hypertension, protein calorie malnutrition.   Patient was referred and last seen by Primary Care Provider, Dr. Delbert, on 03/20/24. A1c at that visit was 6.5%. Patient was diagnosed with T2DM. This was managed as preDM for several years. With her hx, it is challenging to manage her diet due to restrictions on sugar, sodium, and carbohydrates. Of note, she takes mirtazapine  at bedtime and this has increased her appetite, particularly for sweets.  Family/Social History:  -Fhx: HTN, stroke, DM, heart disease   Current diabetes medications include: none Current hypertension medications include: amlodipine  10 mg daily, carvedilol  3.125 mg BID, losartan   Current hyperlipidemia medications include: atorvastatin  20 mg daily   Patient reports adherence to taking all medications as prescribed.   Insurance coverage: [place] Medicaid   Patient denies hypoglycemic events.  Reported home fasting blood sugars: needs a glucometer   Reported 2 hour post-meal/random blood sugars: needs a glucometer    Patient reports nocturia (nighttime urination).  Patient reports neuropathy (nerve pain) in her feet. Patient denies visual changes. Patient reports self foot exams.   Patient-reported exercise habits: limited activity due to her stroke hx and dementia. She has very slow gait and works to limit activity to prevent falls.   O:  Lab Results  Component Value Date   HGBA1C 6.5 (A) 03/20/2024   There were no vitals filed for this visit.  Lipid Panel     Component Value Date/Time   CHOL 224 (H) 05/19/2021 1158   TRIG 111 05/19/2021 1158   HDL 79 05/19/2021 1158    LDLCALC 126 (H) 05/19/2021 1158    Clinical Atherosclerotic Cardiovascular Disease (ASCVD): No  The ASCVD Risk score (Arnett DK, et al., 2019) failed to calculate for the following reasons:   Risk score cannot be calculated because patient has a medical history suggesting prior/existing ASCVD   Patient is participating in a Managed Medicaid Plan: YES   A/P: Diabetes newly diagnosed with an A1c of 6.5%. With her age and comorbid conditions, I recommend to monitor with less stringent goals. Home blood sugar goals are slightly different in this setting. She has some cognitive and functional limitations given her hx. I recommend a fasting blood sugar goal of 90 - 150 mg/dL. Her bedtime glucose should be < 180 mg/dL but I emphasized that we can focus on keeping these < 200 mg/dL for simplicity. She is not currently on any antihyperglycemic medications. Patient is not symptomatic but is able to verbalize appropriate hypoglycemia management plan. -Accu Chek Guide supplies sent. -Will proceed with lifestyle management alone at this time. -Extensively discussed pathophysiology of diabetes, recommended lifestyle interventions, dietary effects on blood sugar control.  -Counseled on s/sx of and management of hypoglycemia.  -Next A1c anticipated 05/2024.   Written patient instructions provided. Patient verbalized understanding of treatment plan.  Total time in face to face counseling 30 minutes.    Follow-up:  Pharmacist in 1 month to review home sugar readings.  Herlene Fleeta Morris, PharmD, JAQUELINE, CPP Clinical Pharmacist [facility] & [facility] [phone]

## 2024-04-23 DIAGNOSIS — F0150 Vascular dementia without behavioral disturbance: Secondary | ICD-10-CM | POA: Diagnosis not present

## 2024-04-24 DIAGNOSIS — F0150 Vascular dementia without behavioral disturbance: Secondary | ICD-10-CM | POA: Diagnosis not present

## 2024-04-25 ENCOUNTER — Encounter: Payer: Self-pay | Admitting: Family Medicine

## 2024-04-25 DIAGNOSIS — F0150 Vascular dementia without behavioral disturbance: Secondary | ICD-10-CM | POA: Diagnosis not present

## 2024-04-25 DIAGNOSIS — N183 Chronic kidney disease, stage 3 unspecified: Secondary | ICD-10-CM | POA: Insufficient documentation

## 2024-04-25 DIAGNOSIS — E119 Type 2 diabetes mellitus without complications: Secondary | ICD-10-CM | POA: Insufficient documentation

## 2024-04-28 DIAGNOSIS — F0150 Vascular dementia without behavioral disturbance: Secondary | ICD-10-CM | POA: Diagnosis not present

## 2024-05-21 ENCOUNTER — Encounter: Payer: Self-pay | Admitting: Dietician

## 2024-05-21 ENCOUNTER — Encounter: Attending: Family Medicine | Admitting: Dietician

## 2024-05-21 VITALS — Wt 90.0 lb

## 2024-05-21 DIAGNOSIS — R634 Abnormal weight loss: Secondary | ICD-10-CM | POA: Insufficient documentation

## 2024-05-21 NOTE — Progress Notes (Signed)
 Medical Nutrition Therapy  Appointment Start time:  1400  Appointment End time:  1440  Primary concerns today: unintentional weight loss; weight restoration   Referral diagnosis: weight loss Preferred learning style: no preference indicated Learning readiness: ready   NUTRITION ASSESSMENT   Anthropometrics   Wt 05/21/24: 90 lb  Clinical Medical Hx: reviewed; HTN, stroke,  Medications: reviewed Labs: reviewed Notable Signs/Symptoms: none reported Food Allergies: blueberries  Lifestyle & Dietary Hx  Pt present today with her daughter Erica Lopez.   Daughter reports pt has been unintentionally losing weight over the last few years. She states pt was around 100 lb prior to moving in with her in 2018.   Pt daughter reports pt has an aide who comes from 11am-1:15pm daily. Pt daughter works 2 jobs, so she is often gone. Pt reports she tries not to go into the kitchen when daughter is not home. Pt reports aide helps with cooking.   Pt reports when she gets too heavy she likes to go on a diet.   Estimated daily fluid intake: 32 oz Supplements: none Sleep: pt reports sleeping good from midnight to 10am.  Stress / self-care: unable to assess Current average weekly physical activity: ADLs  24-Hr Dietary Recall First Meal: 11am: pancake, egg, sausage Snack: rice cakes Second Meal: none Snack: none Third Meal: hibachi OR spaghetti OR baked chicken and baked potato and greens Snack: chocolate Beverages: water, orange juice occasionally, zero sugar soda   NUTRITION DIAGNOSIS  [place]-3.2 Unintentional weight loss As related to Decreased ability to consume sufficient energy.  As evidenced by fatigue, muscle loss, poor appetite, unable to cook for self.   NUTRITION INTERVENTION  Nutrition education (E-1) on the following topics:   Nutrition Therapy for Gaining Weight A high-calorie, high-protein diet has been recommended to you. Your registered dietitian nutritionist (RDN) may have  recommended this diet because you are having difficulty eating enough calories throughout the day, you have lost weight, and/or you need to add protein to your diet.  Sometimes you may not feel like eating, even if you know the importance of good nutrition. The recommendations in this handout can help you with the following: Regaining your strength and energy Keeping your body healthy Healing and recovering from surgery or illness and fighting infection  Schedule Meals and Snacks Several small meals and snacks are often better tolerated and digested than large meals. Plan to eat 3 meals and 3 snacks daily. Experiment with timing meals to find out when you have a larger appetite. Appetite may be greatest in the morning after not eating all night so you may prefer to eat your larger meals and snacks in the morning and at lunch. Breakfast-type foods are often better tolerated so eat foods such as eggs, pancakes, waffles and cereal for any meal or snack. Carry snacks with you so you are prepared to eat every 2 to 3 hours. Determine what works best for you if your bodys cues for feeling hungry or full are not working. Eat a small meal or snack even if you dont feel hungry. Set a timer to remind you when it is time to eat.  Add Calories to Your Meals and Snacks Try adding calorie-dense foods so that each bite provides more nutrition. Drink milk, chocolate milk, soy milk, or smoothies instead of low-calorie beverages such as diet drinks or water. Cook with milk or soy milk instead of water when making dishes such as hot cereal, cocoa, or pudding. Mix dried fruit, nuts, granola, or  dry cereal with yogurt or hot cereals. Enjoy snacks such as milkshakes, smoothies, pudding, ice cream, or custard. Blend a fruit smoothie of a banana, frozen berries, milk or soy milk, and powdered milk or protein powder.  Add Protein to Your Meals and Snacks Choose at least one protein food at each meal and snack to  increase your daily intake. Add  cup nonfat dry milk powder or protein powder to make a high-protein milk to drink or to use in recipes that call for milk. Vanilla or peppermint extract or unsweetened cocoa powder could help to boost the flavor. Add hard-cooked eggs, leftover meat, grated cheese, canned beans or tofu to noodles, rice, salads, sandwiches, soups, casseroles, pasta, tuna and other mixed dishes. Add powdered milk or protein powder to hot cereals, meatloaf, casseroles, scrambled eggs, sauces, cream soups, and shakes. Add beans and lentils to salads, soups, casseroles, and vegetable dishes. Eat cottage cheese or yogurt, especially Greek yogurt, with fruit as a snack or dessert. Eat peanut or other nut butters on crackers, bread, toast, waffles, apples, bananas or celery sticks. Add it to milkshakes, smoothies, or desserts. Consider a ready-made protein shake.   Add Fats to Your Meals and Snacks Try adding fats to your meals and snacks. Fat provides more calories in fewer bites than carbohydrate or protein and adds flavors to your foods. Snack on nuts and seeds or add them to foods like salads, pasta, cereals, yogurt, and ice cream.  Saut or stir-fry vegetables, meats, chicken, fish or tofu in olive or canola oil.  Add olive oil, other vegetable oils, butter or margarine to soups, vegetables, potatoes, cooked cereal, rice, pasta, bread, crackers, pancakes, or waffles. Snack on olives or add to pasta, pizza, or salad. Add avocado or guacamole to your salads, sandwiches, and other entrees. Include fatty fish such as salmon in your weekly meal plan.  Handouts Provided Include  Nutrition Care Manual: High Calorie High Protein Nutrition Therapy Plate Method  Samples Provided Include Glucerna chocolate and vanilla  Learning Style & Readiness for Change Teaching method utilized: Visual & Auditory  Demonstrated degree of understanding via: Teach Back  Barriers to learning/adherence to  lifestyle change: none  Goals Established by Pt  Aim to follow scheduled meal and snack times.  -Breakfast -Snack (spaced 2 hours between) -Lunch -Snack (spaced 2 hours between) -Dinner   Aim to add 1-2 tsp of oil or butter to dishes such as pastas, rice, baked potato, etc to increase overall fat and calories.   Follow nutrient dense snack ideas provided.    MONITORING & EVALUATION Dietary intake, weekly physical activity, and follow up in 4 weeks.  Next Steps  Patient is to call for questions.

## 2024-05-27 ENCOUNTER — Ambulatory Visit: Attending: Family Medicine | Admitting: Pharmacist

## 2024-05-27 ENCOUNTER — Encounter: Payer: Self-pay | Admitting: Pharmacist

## 2024-05-27 DIAGNOSIS — N1832 Chronic kidney disease, stage 3b: Secondary | ICD-10-CM

## 2024-05-27 DIAGNOSIS — E1122 Type 2 diabetes mellitus with diabetic chronic kidney disease: Secondary | ICD-10-CM

## 2024-05-27 NOTE — Progress Notes (Signed)
 "   S:     No chief complaint on file.  64 y.o. female who presents for diabetes evaluation, education, and management. Patient arrives in good spirits and presents with her daughter who serves as her historian.   PMH is significant for history of CVA in 2014 with resulting mild cognitive deficits and no hemiparesis, vascular dementia, hypertension, protein calorie malnutrition.   Patient was referred and last seen by Primary Care Provider, Dr. Delbert, on 03/20/24. A1c at that visit was 6.5%. Patient was diagnosed with T2DM.   I saw her last month to provide DM education on 04/22/24. I also sent her a rxn for Accu Chek supplies to check blood glucose levels at home. Her daughter is with her today. She was able to check blood sugar once but has been unable to monitor consistently due to needing device teaching. She brings the Accu Chek in for review today.   Family/Social History:  -Fhx: HTN, stroke, DM, heart disease   Current diabetes medications include: none Current hypertension medications include: amlodipine  10 mg daily, carvedilol  3.125 mg BID, losartan   Current hyperlipidemia medications include: atorvastatin  20 mg daily   Patient reports adherence to taking all medications as prescribed.   Insurance coverage: [place] Medicaid   Patient denies hypoglycemic events.  Reported home fasting blood sugars: 1 reading of 149 mg/dL.  Patient reports nocturia (nighttime urination).  Patient reports neuropathy (nerve pain) in her feet. Patient denies visual changes. Patient reports self foot exams.   Patient-reported exercise habits: limited activity due to her stroke hx and dementia. She has very slow gait and works to limit activity to prevent falls.   O:  Lab Results  Component Value Date   HGBA1C 6.5 (A) 03/20/2024   There were no vitals filed for this visit.  Lipid Panel     Component Value Date/Time   CHOL 224 (H) 05/19/2021 1158   TRIG 111 05/19/2021 1158   HDL 79  05/19/2021 1158   LDLCALC 126 (H) 05/19/2021 1158    Clinical Atherosclerotic Cardiovascular Disease (ASCVD): No  The ASCVD Risk score (Arnett DK, et al., 2019) failed to calculate for the following reasons:   Risk score cannot be calculated because patient has a medical history suggesting prior/existing ASCVD   * - Cholesterol units were assumed   Patient is participating in a Managed Medicaid Plan: YES   A/P: Diabetes newly diagnosed with an A1c of 6.5%. With her age and comorbid conditions, I recommend to monitor with less stringent goals. Home blood sugar goals are slightly different in this setting. She has some cognitive and functional limitations in her hx. I recommend a fasting blood sugar goal of 90 - 150 mg/dL. Her bedtime glucose should be < 180 mg/dL but I emphasized that we can focus on keeping these < 200 mg/dL for simplicity. She is not currently on any antihyperglycemic medications. Patient is not symptomatic but is able to verbalize appropriate hypoglycemia management plan. -Patient was educated on the use of the Accu Chek Guide blood glucose meter. Reviewed necessary supplies and operation of the meter. Also reviewed goal blood glucose levels. Patient was able to demonstrate use. All questions and concerns were addressed. -Will proceed with lifestyle management alone at this time. -Extensively discussed pathophysiology of diabetes, recommended lifestyle interventions, dietary effects on blood sugar control.  -Counseled on s/sx of and management of hypoglycemia.  -Next A1c anticipated w/ PCP next month.   Written patient instructions provided. Patient verbalized understanding of treatment plan.  Total time in face to face counseling 30 minutes.    Follow-up:  PCP in Feb, 2026.  Herlene Fleeta Morris, PharmD, JAQUELINE, CPP Clinical Pharmacist [facility] & [facility] [phone]   "

## 2024-06-03 ENCOUNTER — Telehealth: Payer: Self-pay

## 2024-06-03 DIAGNOSIS — Z7189 Other specified counseling: Secondary | ICD-10-CM

## 2024-06-03 NOTE — Progress Notes (Signed)
 Complex Care Management Note  Care Guide Note 06/03/2024 Name: Araminta Zorn MRN: [id number] DOB: November 16, 1960  Marisela Line is a 64 y.o. year old female who sees Delbert Clam, MD for primary care. I reached out to Eva Abu by phone today to offer complex care management services.  Ms. Cleaves was given information about Complex Care Management services today including:   The Complex Care Management services include support from the care team which includes your Nurse Care Manager, Clinical Social Worker, or Pharmacist.  The Complex Care Management team is here to help remove barriers to the health concerns and goals most important to you. Complex Care Management services are voluntary, and the patient may decline or stop services at any time by request to their care team member.   Complex Care Management Consent Status: Patient agreed to services and verbal consent obtained.   Follow up plan:  Telephone appointment with complex care management team member scheduled for:  06/23/2024  Encounter Outcome:  Patient Scheduled  Jeoffrey Buffalo , RMA     [place]  [facility], [facility] Guide  Direct Dial: [phone]  Website: [email or web address]

## 2024-06-03 NOTE — Progress Notes (Signed)
 Complex Care Management Note Care Guide Note  06/03/2024 Name: Erica Lopez MRN: [id number] DOB: 1961-03-20   Complex Care Management Outreach Attempts: An unsuccessful telephone outreach was attempted today to offer the patient information about available complex care management services.  Follow Up Plan:  Additional outreach attempts will be made to offer the patient complex care management information and services.   Encounter Outcome:  No Answer  Jeoffrey Buffalo , RMA     [place]  [facility], [facility] Guide  Direct Dial: [phone]  Website: [email or web address]

## 2024-06-18 ENCOUNTER — Encounter: Admitting: Dietician

## 2024-06-23 ENCOUNTER — Other Ambulatory Visit: Payer: Self-pay

## 2024-06-23 ENCOUNTER — Other Ambulatory Visit: Payer: Self-pay | Admitting: *Deleted

## 2024-06-23 DIAGNOSIS — I10 Essential (primary) hypertension: Secondary | ICD-10-CM

## 2024-06-23 NOTE — Patient Outreach (Signed)
 Complex Care Management   Visit Note  06/23/2024  Name:  Erica Lopez MRN: [id number] DOB: [birth year]  Situation: Referral received for Complex Care Management related to SDOH Barriers:  Food insecurity Lack of essential utilities insecurity Financial Resource Strain Community resources for dementia and HTN I obtained verbal consent from Caregiver Patient daughter Erica Lopez.  Visit completed with Caregiver, patient's daughter Erica Lopez  on the phone  Background:   Past Medical History:  Diagnosis Date   Hypertension    Stroke [facility]) 2014    Assessment: Patient Reported Symptoms:  Cognitive Cognitive Status: Able to follow simple commands, Alert and oriented to person, place, and time, Insightful and able to interpret abstract concepts, Normal speech and language skills, Confused or disoriented (Daughter reports that patient gets confused or disoriented at times.  Daughter reports that she is easly redirected) Cognitive/Intellectual Conditions Management [RPT]: None reported or documented in medical history or problem list   Health Maintenance Behaviors: Annual physical exam, Healthy diet, Sleep adequate, Spiritual practice(s) Healing Pattern: Average Health Facilitated by: Healthy diet, Rest  Neurological Neurological Review of Symptoms: Weakness Neurological Management Strategies: Adequate rest, Coping strategies Neurological Comment: Patient has weakness on right side due to CVA in 2014.  Patient's that she is able to move the right side of her body.  HEENT HEENT Symptoms Reported: No symptoms reported HEENT Management Strategies: Adequate rest, Coping strategies, Routine screening HEENT Self-Management Outcome: 4 (good)    Cardiovascular Cardiovascular Symptoms Reported: Swelling in legs or feet Does patient have uncontrolled Hypertension?: No Cardiovascular Management Strategies: Adequate rest, Coping strategies, Routine screening Cardiovascular Self-Management Outcome: 4  (good)  Respiratory Respiratory Symptoms Reported: No symptoms reported Respiratory Management Strategies: Adequate rest, Routine screening Respiratory Self-Management Outcome: 4 (good)  Endocrine Endocrine Symptoms Reported: No symptoms reported Is patient diabetic?: Yes Is patient checking blood sugars at home?: Yes List most recent blood sugar readings, include date and time of day: Daughter reports checking blood sugars 2-3 times weekly.  Daughter reports new diagnosis for her mother.  Daughter reports that she has to reschedule appointment with nutritionist.  Discussed will send information to home address about counting carbs and food options for persons with diabetes.  Daughter reports that blood sugars have been averaging around 123. Endocrine Self-Management Outcome: 4 (good)  Gastrointestinal Gastrointestinal Symptoms Reported: No symptoms reported Gastrointestinal Management Strategies: Adequate rest, Coping strategies Gastrointestinal Self-Management Outcome: 4 (good)    Genitourinary Genitourinary Symptoms Reported: Incontinence Genitourinary Management Strategies: Coping strategies, Adequate rest, Incontinence garment/pad Genitourinary Self-Management Outcome: 4 (good)  Integumentary Integumentary Symptoms Reported: Other Other Integumentary Symptoms: Patient's daughter reports that her mother has dry skin and they are using over the counter moisturizers to help with dry skin. Skin Management Strategies: Adequate rest, Coping strategies, Routine screening Skin Self-Management Outcome: 3 (uncertain)  Musculoskeletal Musculoskelatal Symptoms Reviewed: Weakness, Joint pain Additional Musculoskeletal Details: Daughter reports that Erica Lopez had right sided weakness due to CVA in 2014.  Daughter reports that patient is able to get around the home without any assistive devices.  Occassional back and joint pain.  Daughter is looking into the Voltren gel for her mother.  Reports that  insurance will no longer cover the gel. Musculoskeletal Management Strategies: Adequate rest, Coping strategies Musculoskeletal Self-Management Outcome: 3 (uncertain) Falls in the past year?: No Number of falls in past year: 1 or less Was there an injury with Fall?: No Fall Risk Category Calculator: 0 Patient Fall Risk Level: Low Fall Risk Patient at Risk for Falls Due  to: No Fall Risks, Other (Comment), History of fall(s) (PMH of CVA) Fall risk Follow up: Falls evaluation completed, Education provided, Falls prevention discussed  Psychosocial Psychosocial Symptoms Reported: No symptoms reported Additional Psychological Details: Patient's daughter reports no signs of depression, anxiety or sadness Behavioral Management Strategies: Adequate rest, Support system Behavioral Health Self-Management Outcome: 4 (good) Major Change/Loss/Stressor/Fears (CP): Denies Quality of Family Relationships: helpful, involved, stressful Do you feel physically threatened by others?: No    06/23/2024    PHQ2-9 Depression Screening   Little interest or pleasure in doing things Not at all  Feeling down, depressed, or hopeless Not at all  PHQ-2 - Total Score 0  Trouble falling or staying asleep, or sleeping too much    Feeling tired or having little energy    Poor appetite or overeating     Feeling bad about yourself - or that you are a failure or have let yourself or your family down    Trouble concentrating on things, such as reading the newspaper or watching television    Moving or speaking so slowly that other people could have noticed.  Or the opposite - being so fidgety or restless that you have been moving around a lot more than usual    Thoughts that you would be better off dead, or hurting yourself in some way    PHQ2-9 Total Score    If you checked off any problems, how difficult have these problems made it for you to do your work, take care of things at home, or get along with other people     Depression Interventions/Treatment      There were no vitals filed for this visit. Pain Scale: 0-10 Pain Score: 0-No pain  Medications Reviewed Today     Reviewed by Jorja Nichole LABOR, RN (Case Manager) on 06/23/24 at 1205  Med List Status: <None>   Medication Order Taking? Sig Documenting Provider Last Dose Status Informant  Accu-Chek Softclix Lancets lancets [id number] Yes Use to check blood sugar once daily. Newlin, Enobong, MD  Active   amLODipine  (NORVASC ) 10 MG tablet [id number] Yes Take 1 tablet (10 mg total) by mouth daily. Newlin, Enobong, MD  Active   atorvastatin  (LIPITOR) 20 MG tablet [id number] Yes Take 1 tablet (20 mg total) by mouth daily. Newlin, Enobong, MD  Active   Blood Glucose Monitoring Suppl (ACCU-CHEK GUIDE) w/Device KIT [id number] Yes Use to check blood sugar once daily. Newlin, Enobong, MD  Active   carvedilol  (COREG ) 3.125 MG tablet [id number] Yes Take 1 tablet (3.125 mg total) by mouth 2 (two) times daily with a meal. Delbert Clam, MD  Active   diclofenac  Sodium (VOLTAREN ) 1 % GEL [id number]  Apply 4 g topically 4 (four) times daily.  Patient not taking: Reported on 06/23/2024   Newlin, Enobong, MD  Active   glucose blood (ACCU-CHEK GUIDE TEST) test strip [id number] Yes Use to check blood sugar once daily. Newlin, Enobong, MD  Active   losartan  (COZAAR ) 25 MG tablet [id number] Yes Take 1 tablet (25 mg total) by mouth daily. Newlin, Enobong, MD  Active   mirtazapine  (REMERON ) 30 MG tablet [id number] Yes Take 1 tablet (30 mg total) by mouth at bedtime. Newlin, Enobong, MD  Active   Misc. Devices MISC [id number]  Bedside Commode Delbert Clam, MD  Active   Misc. Devices MISC [id number]  Shower chair.  Diagnosis - Dementia Delbert Clam, MD  Active   polyethylene glycol powder (GLYCOLAX /MIRALAX ) 17 GM/SCOOP powder [id number] Yes Take 17 g by  mouth daily. Newlin, Enobong, MD  Active   triamcinolone  (KENALOG ) 0.025 % ointment [id number]  Apply 1 application topically 2 (two)  times daily.  Patient not taking: Reported on 06/23/2024   Tonette Lauraine CHRISTELLA DEVONNA  Active             Recommendation:   PCP Follow-up Continue Current Plan of Care  Follow Up Plan:   Telephone follow-up in 1 month: 08/12/24 @ 3 pm.   Americo Vallery, RN, BSN, ACM RN Care Manager [employer] [phone]

## 2024-06-24 ENCOUNTER — Ambulatory Visit: Admitting: Family Medicine

## 2024-07-02 ENCOUNTER — Telehealth

## 2024-08-12 ENCOUNTER — Telehealth: Admitting: *Deleted
